# Patient Record
Sex: Female | Born: 1975 | Race: Black or African American | Hispanic: No | Marital: Married | Smoking: Never smoker
Health system: Southern US, Community
[De-identification: ages and names within clinical notes are randomized; demographics above are authoritative.]

## PROBLEM LIST (undated history)

## (undated) DIAGNOSIS — I10 Essential (primary) hypertension: Secondary | ICD-10-CM

## (undated) DIAGNOSIS — G43909 Migraine, unspecified, not intractable, without status migrainosus: Secondary | ICD-10-CM

## (undated) DIAGNOSIS — N83209 Unspecified ovarian cyst, unspecified side: Secondary | ICD-10-CM

## (undated) DIAGNOSIS — R0600 Dyspnea, unspecified: Secondary | ICD-10-CM

## (undated) DIAGNOSIS — R42 Dizziness and giddiness: Secondary | ICD-10-CM

## (undated) DIAGNOSIS — R002 Palpitations: Secondary | ICD-10-CM

## (undated) DIAGNOSIS — IMO0002 Reserved for concepts with insufficient information to code with codable children: Secondary | ICD-10-CM

## (undated) DIAGNOSIS — I341 Nonrheumatic mitral (valve) prolapse: Secondary | ICD-10-CM

## (undated) DIAGNOSIS — I499 Cardiac arrhythmia, unspecified: Secondary | ICD-10-CM

## (undated) DIAGNOSIS — N390 Urinary tract infection, site not specified: Secondary | ICD-10-CM

## (undated) DIAGNOSIS — M797 Fibromyalgia: Secondary | ICD-10-CM

## (undated) DIAGNOSIS — R569 Unspecified convulsions: Secondary | ICD-10-CM

## (undated) DIAGNOSIS — F410 Panic disorder [episodic paroxysmal anxiety] without agoraphobia: Secondary | ICD-10-CM

## (undated) DIAGNOSIS — M419 Scoliosis, unspecified: Secondary | ICD-10-CM

## (undated) DIAGNOSIS — N289 Disorder of kidney and ureter, unspecified: Secondary | ICD-10-CM

## (undated) HISTORY — DX: Reserved for concepts with insufficient information to code with codable children: IMO0002

## (undated) HISTORY — DX: Panic disorder (episodic paroxysmal anxiety): F41.0

## (undated) HISTORY — PX: OTHER SURGICAL HISTORY: SHX169

## (undated) HISTORY — DX: Scoliosis, unspecified: M41.9

## (undated) HISTORY — PX: LAPAROSCOPY: SHX197

## (undated) HISTORY — DX: Nonrheumatic mitral (valve) prolapse: I34.1

## (undated) HISTORY — PX: TUBAL LIGATION: SHX77

## (undated) HISTORY — PX: CHOLECYSTECTOMY: SHX55

## (undated) HISTORY — DX: Dizziness and giddiness: R42

## (undated) HISTORY — PX: ESOPHAGOGASTRODUODENOSCOPY ENDOSCOPY: SHX5814

## (undated) HISTORY — PX: KNEE SURGERY: SHX244

## (undated) HISTORY — PX: BACK SURGERY: SHX140

## (undated) HISTORY — DX: Migraine, unspecified, not intractable, without status migrainosus: G43.909

---

## 1998-02-08 ENCOUNTER — Inpatient Hospital Stay (HOSPITAL_COMMUNITY): Admission: AD | Admit: 1998-02-08 | Discharge: 1998-02-12 | Payer: Self-pay | Admitting: Obstetrics

## 1998-04-05 ENCOUNTER — Encounter: Admission: RE | Admit: 1998-04-05 | Discharge: 1998-04-05 | Payer: Self-pay | Admitting: Obstetrics & Gynecology

## 1998-04-05 ENCOUNTER — Other Ambulatory Visit: Admission: RE | Admit: 1998-04-05 | Discharge: 1998-04-05 | Payer: Self-pay | Admitting: Obstetrics & Gynecology

## 1998-04-25 ENCOUNTER — Inpatient Hospital Stay (HOSPITAL_COMMUNITY): Admission: AD | Admit: 1998-04-25 | Discharge: 1998-04-29 | Payer: Self-pay | Admitting: Obstetrics & Gynecology

## 1998-06-07 ENCOUNTER — Encounter: Admission: RE | Admit: 1998-06-07 | Discharge: 1998-06-07 | Payer: Self-pay | Admitting: Obstetrics & Gynecology

## 1998-06-28 ENCOUNTER — Inpatient Hospital Stay (HOSPITAL_COMMUNITY): Admission: AD | Admit: 1998-06-28 | Discharge: 1998-06-28 | Payer: Self-pay | Admitting: *Deleted

## 1998-11-28 ENCOUNTER — Inpatient Hospital Stay (HOSPITAL_COMMUNITY): Admission: AD | Admit: 1998-11-28 | Discharge: 1998-11-28 | Payer: Self-pay | Admitting: Obstetrics

## 1999-09-07 ENCOUNTER — Encounter: Admission: RE | Admit: 1999-09-07 | Discharge: 1999-09-07 | Payer: Self-pay | Admitting: Hematology and Oncology

## 2002-05-23 ENCOUNTER — Encounter: Payer: Self-pay | Admitting: Emergency Medicine

## 2002-05-23 ENCOUNTER — Inpatient Hospital Stay (HOSPITAL_COMMUNITY): Admission: EM | Admit: 2002-05-23 | Discharge: 2002-05-26 | Payer: Self-pay | Admitting: Emergency Medicine

## 2002-07-01 ENCOUNTER — Encounter: Admission: RE | Admit: 2002-07-01 | Discharge: 2002-09-29 | Payer: Self-pay | Admitting: Internal Medicine

## 2002-10-01 ENCOUNTER — Emergency Department (HOSPITAL_COMMUNITY): Admission: EM | Admit: 2002-10-01 | Discharge: 2002-10-01 | Payer: Self-pay | Admitting: Emergency Medicine

## 2003-08-18 ENCOUNTER — Encounter: Payer: Self-pay | Admitting: Family Medicine

## 2003-08-18 ENCOUNTER — Ambulatory Visit (HOSPITAL_COMMUNITY): Admission: RE | Admit: 2003-08-18 | Discharge: 2003-08-18 | Payer: Self-pay | Admitting: Family Medicine

## 2004-04-14 ENCOUNTER — Emergency Department (HOSPITAL_COMMUNITY): Admission: EM | Admit: 2004-04-14 | Discharge: 2004-04-14 | Payer: Self-pay | Admitting: Emergency Medicine

## 2004-12-29 ENCOUNTER — Ambulatory Visit (HOSPITAL_COMMUNITY): Admission: RE | Admit: 2004-12-29 | Discharge: 2004-12-29 | Payer: Self-pay | Admitting: Family Medicine

## 2005-01-23 ENCOUNTER — Encounter: Admission: RE | Admit: 2005-01-23 | Discharge: 2005-02-05 | Payer: Self-pay | Admitting: Neurosurgery

## 2005-02-24 ENCOUNTER — Emergency Department (HOSPITAL_COMMUNITY): Admission: EM | Admit: 2005-02-24 | Discharge: 2005-02-25 | Payer: Self-pay | Admitting: *Deleted

## 2005-05-07 ENCOUNTER — Emergency Department (HOSPITAL_COMMUNITY): Admission: EM | Admit: 2005-05-07 | Discharge: 2005-05-08 | Payer: Self-pay | Admitting: Emergency Medicine

## 2005-05-11 ENCOUNTER — Other Ambulatory Visit: Admission: RE | Admit: 2005-05-11 | Discharge: 2005-05-11 | Payer: Self-pay | Admitting: Obstetrics & Gynecology

## 2005-05-17 ENCOUNTER — Emergency Department (HOSPITAL_COMMUNITY): Admission: EM | Admit: 2005-05-17 | Discharge: 2005-05-18 | Payer: Self-pay | Admitting: Emergency Medicine

## 2005-05-29 ENCOUNTER — Ambulatory Visit: Payer: Self-pay | Admitting: Obstetrics and Gynecology

## 2005-06-11 ENCOUNTER — Ambulatory Visit: Payer: Self-pay | Admitting: Obstetrics and Gynecology

## 2005-06-11 ENCOUNTER — Ambulatory Visit (HOSPITAL_COMMUNITY): Admission: RE | Admit: 2005-06-11 | Discharge: 2005-06-11 | Payer: Self-pay | Admitting: Obstetrics and Gynecology

## 2005-06-19 ENCOUNTER — Ambulatory Visit: Payer: Self-pay | Admitting: Obstetrics and Gynecology

## 2005-06-22 ENCOUNTER — Ambulatory Visit (HOSPITAL_COMMUNITY): Admission: RE | Admit: 2005-06-22 | Discharge: 2005-06-22 | Payer: Self-pay | Admitting: *Deleted

## 2005-12-12 ENCOUNTER — Ambulatory Visit (HOSPITAL_COMMUNITY): Admission: RE | Admit: 2005-12-12 | Discharge: 2005-12-12 | Payer: Self-pay | Admitting: Neurosurgery

## 2006-03-20 ENCOUNTER — Emergency Department (HOSPITAL_COMMUNITY): Admission: EM | Admit: 2006-03-20 | Discharge: 2006-03-20 | Payer: Self-pay | Admitting: Emergency Medicine

## 2006-06-27 ENCOUNTER — Emergency Department (HOSPITAL_COMMUNITY): Admission: EM | Admit: 2006-06-27 | Discharge: 2006-06-27 | Payer: Self-pay | Admitting: Emergency Medicine

## 2006-10-09 ENCOUNTER — Ambulatory Visit: Payer: Self-pay | Admitting: Obstetrics & Gynecology

## 2006-12-30 ENCOUNTER — Emergency Department (HOSPITAL_COMMUNITY): Admission: EM | Admit: 2006-12-30 | Discharge: 2006-12-30 | Payer: Self-pay | Admitting: Emergency Medicine

## 2007-03-04 ENCOUNTER — Encounter (INDEPENDENT_AMBULATORY_CARE_PROVIDER_SITE_OTHER): Payer: Self-pay | Admitting: Cardiology

## 2007-03-04 ENCOUNTER — Ambulatory Visit (HOSPITAL_COMMUNITY): Admission: RE | Admit: 2007-03-04 | Discharge: 2007-03-04 | Payer: Self-pay | Admitting: Family Medicine

## 2007-06-16 ENCOUNTER — Emergency Department (HOSPITAL_COMMUNITY): Admission: EM | Admit: 2007-06-16 | Discharge: 2007-06-16 | Payer: Self-pay | Admitting: Emergency Medicine

## 2007-07-09 ENCOUNTER — Ambulatory Visit: Payer: Self-pay | Admitting: *Deleted

## 2007-07-09 ENCOUNTER — Encounter: Payer: Self-pay | Admitting: Obstetrics & Gynecology

## 2007-07-10 ENCOUNTER — Ambulatory Visit (HOSPITAL_COMMUNITY): Admission: RE | Admit: 2007-07-10 | Discharge: 2007-07-10 | Payer: Self-pay | Admitting: *Deleted

## 2007-08-19 ENCOUNTER — Emergency Department (HOSPITAL_COMMUNITY): Admission: EM | Admit: 2007-08-19 | Discharge: 2007-08-19 | Payer: Self-pay | Admitting: Emergency Medicine

## 2007-09-10 ENCOUNTER — Encounter: Payer: Self-pay | Admitting: Infectious Disease

## 2007-09-10 ENCOUNTER — Emergency Department (HOSPITAL_COMMUNITY): Admission: EM | Admit: 2007-09-10 | Discharge: 2007-09-10 | Payer: Self-pay | Admitting: Emergency Medicine

## 2007-10-07 ENCOUNTER — Ambulatory Visit: Payer: Self-pay | Admitting: Obstetrics & Gynecology

## 2008-03-18 ENCOUNTER — Encounter: Admission: RE | Admit: 2008-03-18 | Discharge: 2008-03-18 | Payer: Self-pay | Admitting: Family Medicine

## 2008-08-25 ENCOUNTER — Emergency Department (HOSPITAL_COMMUNITY): Admission: EM | Admit: 2008-08-25 | Discharge: 2008-08-25 | Payer: Self-pay | Admitting: Emergency Medicine

## 2008-08-31 ENCOUNTER — Emergency Department (HOSPITAL_COMMUNITY): Admission: EM | Admit: 2008-08-31 | Discharge: 2008-08-31 | Payer: Self-pay | Admitting: Emergency Medicine

## 2008-12-16 ENCOUNTER — Emergency Department (HOSPITAL_COMMUNITY): Admission: EM | Admit: 2008-12-16 | Discharge: 2008-12-17 | Payer: Self-pay | Admitting: Emergency Medicine

## 2008-12-22 ENCOUNTER — Encounter: Admission: RE | Admit: 2008-12-22 | Discharge: 2008-12-22 | Payer: Self-pay | Admitting: Family Medicine

## 2009-01-25 ENCOUNTER — Encounter: Admission: RE | Admit: 2009-01-25 | Discharge: 2009-02-22 | Payer: Self-pay | Admitting: Family Medicine

## 2009-01-28 ENCOUNTER — Encounter: Admission: RE | Admit: 2009-01-28 | Discharge: 2009-01-28 | Payer: Self-pay | Admitting: Gastroenterology

## 2009-01-31 ENCOUNTER — Encounter: Admission: RE | Admit: 2009-01-31 | Discharge: 2009-01-31 | Payer: Self-pay | Admitting: Gastroenterology

## 2009-07-14 ENCOUNTER — Emergency Department (HOSPITAL_COMMUNITY): Admission: EM | Admit: 2009-07-14 | Discharge: 2009-07-15 | Payer: Self-pay | Admitting: Emergency Medicine

## 2009-08-02 ENCOUNTER — Emergency Department (HOSPITAL_COMMUNITY): Admission: EM | Admit: 2009-08-02 | Discharge: 2009-08-02 | Payer: Self-pay | Admitting: Emergency Medicine

## 2009-10-13 ENCOUNTER — Emergency Department (HOSPITAL_COMMUNITY): Admission: EM | Admit: 2009-10-13 | Discharge: 2009-10-13 | Payer: Self-pay | Admitting: Emergency Medicine

## 2009-12-01 ENCOUNTER — Inpatient Hospital Stay (HOSPITAL_COMMUNITY): Admission: AD | Admit: 2009-12-01 | Discharge: 2009-12-01 | Payer: Self-pay | Admitting: Obstetrics & Gynecology

## 2010-07-04 ENCOUNTER — Encounter: Payer: Self-pay | Admitting: Cardiology

## 2010-07-06 ENCOUNTER — Ambulatory Visit: Payer: Self-pay | Admitting: Cardiology

## 2010-07-06 ENCOUNTER — Encounter: Payer: Self-pay | Admitting: Cardiology

## 2010-07-07 ENCOUNTER — Encounter: Payer: Self-pay | Admitting: Cardiology

## 2010-07-09 ENCOUNTER — Encounter: Payer: Self-pay | Admitting: Cardiology

## 2010-07-14 ENCOUNTER — Ambulatory Visit (HOSPITAL_COMMUNITY): Admission: RE | Admit: 2010-07-14 | Discharge: 2010-07-14 | Payer: Self-pay | Admitting: Gastroenterology

## 2010-07-17 ENCOUNTER — Ambulatory Visit: Payer: Self-pay | Admitting: Cardiology

## 2010-07-17 DIAGNOSIS — R002 Palpitations: Secondary | ICD-10-CM | POA: Insufficient documentation

## 2010-07-17 DIAGNOSIS — I059 Rheumatic mitral valve disease, unspecified: Secondary | ICD-10-CM

## 2010-07-17 DIAGNOSIS — R072 Precordial pain: Secondary | ICD-10-CM

## 2010-07-17 DIAGNOSIS — R9439 Abnormal result of other cardiovascular function study: Secondary | ICD-10-CM

## 2010-07-18 ENCOUNTER — Telehealth (INDEPENDENT_AMBULATORY_CARE_PROVIDER_SITE_OTHER): Payer: Self-pay | Admitting: *Deleted

## 2010-07-25 ENCOUNTER — Ambulatory Visit: Payer: Self-pay | Admitting: Cardiology

## 2010-07-26 ENCOUNTER — Ambulatory Visit (HOSPITAL_COMMUNITY)
Admission: RE | Admit: 2010-07-26 | Discharge: 2010-07-26 | Payer: Self-pay | Source: Home / Self Care | Admitting: Cardiology

## 2010-07-27 ENCOUNTER — Telehealth (INDEPENDENT_AMBULATORY_CARE_PROVIDER_SITE_OTHER): Payer: Self-pay | Admitting: *Deleted

## 2010-07-27 ENCOUNTER — Encounter: Payer: Self-pay | Admitting: Cardiology

## 2010-08-09 ENCOUNTER — Encounter (INDEPENDENT_AMBULATORY_CARE_PROVIDER_SITE_OTHER): Payer: Self-pay | Admitting: General Surgery

## 2010-08-09 ENCOUNTER — Ambulatory Visit (HOSPITAL_COMMUNITY): Admission: RE | Admit: 2010-08-09 | Discharge: 2010-08-10 | Payer: Self-pay | Admitting: General Surgery

## 2010-10-12 ENCOUNTER — Telehealth (INDEPENDENT_AMBULATORY_CARE_PROVIDER_SITE_OTHER): Payer: Self-pay | Admitting: *Deleted

## 2010-11-25 ENCOUNTER — Encounter: Payer: Self-pay | Admitting: Family Medicine

## 2010-11-26 ENCOUNTER — Encounter: Payer: Self-pay | Admitting: Family Medicine

## 2010-11-26 ENCOUNTER — Encounter: Payer: Self-pay | Admitting: Cardiology

## 2010-12-07 NOTE — Assessment & Plan Note (Signed)
Summary: POST [facility] PER CALL FROM 3RD FLOOR-JM   Visit Type:  hospital follow-up Primary Provider:  [facility]  CC:  Abnormal Stress Perfusion.  History of Present Illness: The patient presents for evaluation of chest discomfort and abnormal stress perfusion study. She was admitted on August 30 with an episode of chest heaviness and shortness of breath. Accompanying this she had tingling in her feet, weakness, dizziness, presyncope.  She was hospitalized I have thoroughly reviewed these notes. She ruled out for myocardial infarction with negative cardiac enzymes. CT angiography of the chest demonstrated no evidence of pulmonary embolism. She had an echocardiogram which demonstrated a slightly myxomatous mitral valve with trace mitral regurgitation but no mention of prolapse. She had normal left ventricular function and no other significant valvular abnormalities. She had a stress perfusion study which demonstrated a small reversible mid inferolateral and apical lateral defect as well as a small mid inferoapical defect and an EF of 55%. It was mentioned that there was breast attenuation, mild diaphragmatic attenuation and gut uptake.  At that point it was decided to manage the patient medically and she was discharged. She was started on aspirin. Since discharge she said she has had dizziness and weakness but is slowly improving. She has not been back to doing her household chores or other activities. She did take one nitroglycerin since discharge apparently a few days ago. She has not required any since then. She has palpitations which have been long-standing however, she does not describe presyncope or syncope. She has had no PND orthopnea.  Preventive Screening-Counseling & Management  Alcohol-Tobacco     Smoking Status: never  Current Medications (verified): 1)  Fluoxetine Hcl 20 Mg Caps (Fluoxetine Hcl) .... Take 3 Tablet By Mouth Once A Day 2)  Aspir-Low 81 Mg Tbec (Aspirin)  .... Take 1 Tablet By Mouth Once A Day 3)  Clonazepam 1 Mg Tabs (Clonazepam) .... Take 1 Tablet By Mouth Four Times A Day 4)  Nitrostat 0.4 Mg Subl (Nitroglycerin) .... Use As Directed Chest Pain 5)  Metoprolol Tartrate 25 Mg Tabs (Metoprolol Tartrate) .... Take 1 Tablet By Mouth Two Times A Day  Allergies (verified): 1)  ! Sulfa 2)  ! Norvasc  Comments:  Nurse/Medical Assistant: The patient's medication list and allergies were reviewed with the patient and were updated in the Medication and Allergy Lists.  Past History:  Past Medical History: Panic attacks Scoliosis Mitral valve prolapse Asthma as a child  Past Surgical History: Tubal ligation Laparoscopy C-Section  Social History: Has 3 children in their teens Married Nonsmoker (Never) Denies any active [place] of alcohol Denies any drugs Smoking Status:  never  Review of Systems       Abdominal discomfort, depression/anxiety, joint pains. Otherwise as stated in the history of present illness negative for all other systems.  Vital Signs:  Patient profile:   35 year old female Height:      63 inches Weight:      169 pounds BMI:     30.05 O2 Sat:      98 % on Room air Pulse rate:   85 / minute BP sitting:   109 / 72  (left arm) Cuff size:   regular  Vitals Entered By: Carlye Grippe (July 17, 2010 4:02 PM)  Nutrition Counseling: Patient's BMI is greater than 25 and therefore counseled on weight management options.  O2 Flow:  Room air  Physical Exam  General:  Well developed, well nourished, in no acute distress.  Head:  normocephalic and atraumatic Eyes:  PERRLA/EOM intact; conjunctiva and lids normal. Mouth:  Teeth, gums and palate normal. Oral mucosa normal. Neck:  Neck supple, no JVD. No masses, thyromegaly or abnormal cervical nodes. Chest Wall:  no deformities or breast masses noted Lungs:  Clear bilaterally to auscultation and percussion. Abdomen:  Bowel sounds positive; abdomen soft without  masses, organomegaly, or hernias noted. No hepatosplenomegaly. , mild midline tenderness to palpation, without rebound or guarding Msk:  Back normal, normal gait. Muscle strength and tone normal. Extremities:  No clubbing or cyanosis. Neurologic:  Alert and oriented x 3. Skin:  Intact without lesions or rashes. Cervical Nodes:  no significant adenopathy Inguinal Nodes:  no significant adenopathy Psych:  Normal affect.   Detailed Cardiovascular Exam  Neck    Carotids: Carotids full and equal bilaterally without bruits.      Neck Veins: Normal, no JVD.    Heart    Inspection: no deformities or lifts noted.      Palpation: normal PMI with no thrills palpable.      Auscultation: regular rate and rhythm, S1, S2 without murmurs, rubs, gallops, or clicks.    Vascular    Abdominal Aorta: no palpable masses, pulsations, or audible bruits.      Femoral Pulses: normal femoral pulses bilaterally.      Pedal Pulses: normal pedal pulses bilaterally.      Radial Pulses: normal radial pulses bilaterally.      Peripheral Circulation: no clubbing, cyanosis, or edema noted with normal capillary refill.     Impression & Recommendations:  Problem # 1:  PRECORDIAL PAIN (ICD-786.51) The patient has symptoms as described. Unfortunately her stress test was equivocal with a description of artifact but ischemia could not be excluded. I think the posttest probability of a false positive study is very high in this situation. Her pretest probability of obstructive coronary disease is low. Therefore, I do not think the risk of cardiac catheterization is indicated. However, we cannot exclude obstructive coronary disease without further testing. We discussed at length the possibility of cardiac catheterization versus dobutamine echocardiography versus coronary CT angiography. For many reasons I think the latter is the optimal test. I will beta blocker to try to get her heart rate into the 60s. We will then schedule  this study.  Problem # 2:  MITRAL VALVE DISORDERS (ICD-424.0) She has a slightly myxomatous mitral valve which can be followed clinically. No further testing is indicated at this point.  Problem # 3:  PALPITATIONS (ICD-785.1) She has had palpitations. She was treated in the past with Inderal and if these persist this could be used on a p.r.n. basis.  Other Orders: CT Scan (CT Scan)  Patient Instructions: 1)  Metoprolol 25mg  two times a day - to be taken the day before and the morning of your CT  2)  CT Angiogram  3)  Follow up based on above results Prescriptions: METOPROLOL TARTRATE 25 MG TABS (METOPROLOL TARTRATE) Take 1 tablet by mouth two times a day  #15 x 0   Entered by:   Hoover Brunette, LPN   Authorized by:   Rollene Rotunda, MD, [facility]   Signed by:   Hoover Brunette, LPN on 16/08/9603   Method used:   Electronically to        [facility]  [address] [address] [id number] (retail)       [address]       [place]  [place], [place]  [address]       Ph: [phone] or [phone]       Fax: [phone]   RxID:   [id number]  I have reviewed and approved all prescriptions at the time of this visit. Rollene Rotunda, MD, [facility]  July 17, 2010 5:11 PM

## 2010-12-07 NOTE — Progress Notes (Signed)
Summary: PHONE: [place] [place]  Phone Note Call from Patient Call back at Home Phone [phone]   Caller: Patient Summary of Call: Mrs. Vedia Coffer needs to ask a question about her beta blockers.  Initial call taken by: Zachary George,  July 18, 2010 4:53 PM  Follow-up for Phone Call        Advised pt to be taken the day before and the morning of your CT.  Patient verbalized understanding.   Follow-up by: Hoover Brunette, LPN,  July 24, 2010 2:35 PM

## 2010-12-07 NOTE — Consult Note (Signed)
Summary: CARDIOLOGY CONSULT/ [facility]  CARDIOLOGY CONSULT/ [facility]   Imported By: Zachary George 07/17/2010 14:24:50  _____________________________________________________________________  External Attachment:    Type:   Image     Comment:   External Document

## 2010-12-07 NOTE — Progress Notes (Signed)
  Request Received from [facility] sent to [facility]  October 12, 2010 2:55 PM

## 2010-12-07 NOTE — Letter (Signed)
Summary: Letter/ FAXED [facility] MEDICAL CLEARANCE  Letter/ FAXED [facility] MEDICAL CLEARANCE   Imported By: Dorise Hiss 08/01/2010 11:02:58  _____________________________________________________________________  External Attachment:    Type:   Image     Comment:   External Document

## 2010-12-07 NOTE — Progress Notes (Signed)
Summary: Surgical Clearance  Phone Note Other Incoming Call back at Phone [phone], Fax [phone]   Summary of Call: Received cardiac clearance request from [facility] for lap chole to be done with general anesthesia by Dr. Chevis Pretty "in the near future." Please advise if pt is cleared for surgery. Initial call taken by: Cyril Loosen, RN, BSN,  July 27, 2010 3:46 PM  Follow-up for Phone Call        [name] is at acceptable risk for surgery.  No further cardiovascular testing is indicated. Follow-up by: Rollene Rotunda, MD, [facility],  July 30, 2010 10:08 PM

## 2010-12-07 NOTE — Letter (Signed)
Summary: Discharge Summary  Discharge Summary   Imported By: Zachary George 07/17/2010 14:24:21  _____________________________________________________________________  External Attachment:    Type:   Image     Comment:   External Document

## 2011-01-18 LAB — SURGICAL PCR SCREEN

## 2011-01-18 LAB — COMPREHENSIVE METABOLIC PANEL
ALT: 16 U/L (ref 0–35)
AST: 17 U/L (ref 0–37)
CO2: 28 mEq/L (ref 19–32)
Chloride: 104 mEq/L (ref 96–112)
Creatinine, Ser: 0.66 mg/dL (ref 0.4–1.2)
GFR calc Af Amer: >60 mL/min (ref >60)
GFR calc non Af Amer: >60 mL/min (ref >60)
Total Bilirubin: 0.5 mg/dL (ref 0.3–1.2)

## 2011-01-18 LAB — DIFFERENTIAL
Basophils Absolute: 0 10*3/uL (ref 0.0–0.1)
Basophils Relative: 1 % (ref 0–1)
Eosinophils Absolute: 0.1 10*3/uL (ref 0.0–0.7)
Eosinophils Relative: 3 % (ref 0–5)

## 2011-01-18 LAB — CBC
Hemoglobin: 11.6 g/dL — ABNORMAL LOW (ref 12.0–15.0)
MCH: 29.5 pg (ref 26.0–34.0)
RBC: 3.95 MIL/uL (ref 3.87–5.11)

## 2011-01-18 LAB — MRSA CULTURE

## 2011-01-21 LAB — WET PREP, GENITAL

## 2011-01-21 LAB — CBC
Hemoglobin: 11.2 g/dL — ABNORMAL LOW (ref 12.0–15.0)
MCHC: 33.1 g/dL (ref 30.0–36.0)
MCV: 89.9 fL (ref 78.0–100.0)
RBC: 3.78 MIL/uL — ABNORMAL LOW (ref 3.87–5.11)

## 2011-01-21 LAB — POCT PREGNANCY, URINE: Preg Test, Ur: NEGATIVE

## 2011-01-21 LAB — URINALYSIS, ROUTINE W REFLEX MICROSCOPIC
Bilirubin Urine: NEGATIVE
Ketones, ur: NEGATIVE mg/dL
Nitrite: POSITIVE — AB
Urobilinogen, UA: 1 mg/dL (ref 0.0–1.0)

## 2011-01-21 LAB — GC/CHLAMYDIA PROBE AMP, GENITAL: Chlamydia, DNA Probe: NEGATIVE

## 2011-02-06 LAB — BASIC METABOLIC PANEL
BUN: 10 mg/dL (ref 6–23)
Calcium: 9 mg/dL (ref 8.4–10.5)
GFR calc non Af Amer: >60 mL/min (ref >60)
Potassium: 3.6 mEq/L (ref 3.5–5.1)
Sodium: 135 mEq/L (ref 135–145)

## 2011-02-06 LAB — URINALYSIS, ROUTINE W REFLEX MICROSCOPIC
Glucose, UA: NEGATIVE mg/dL
Nitrite: NEGATIVE
Specific Gravity, Urine: 1.009 (ref 1.005–1.030)
pH: 7.5 (ref 5.0–8.0)

## 2011-02-06 LAB — CBC
HCT: 37.7 % (ref 36.0–46.0)
Platelets: 331 10*3/uL (ref 150–400)
WBC: 5.8 10*3/uL (ref 4.0–10.5)

## 2011-02-06 LAB — URINE MICROSCOPIC-ADD ON

## 2011-02-06 LAB — DIFFERENTIAL
Basophils Absolute: 0 10*3/uL (ref 0.0–0.1)
Eosinophils Relative: 2 % (ref 0–5)
Lymphocytes Relative: 23 % (ref 12–46)
Lymphs Abs: 1.3 10*3/uL (ref 0.7–4.0)
Neutro Abs: 3.8 10*3/uL (ref 1.7–7.7)

## 2011-02-09 LAB — DIFFERENTIAL
Basophils Absolute: 0 10*3/uL (ref 0.0–0.1)
Basophils Relative: 0 % (ref 0–1)
Eosinophils Relative: 2 % (ref 0–5)
Monocytes Absolute: 0.5 10*3/uL (ref 0.1–1.0)
Monocytes Relative: 8 % (ref 3–12)
Neutro Abs: 3.8 10*3/uL (ref 1.7–7.7)

## 2011-02-09 LAB — URINALYSIS, ROUTINE W REFLEX MICROSCOPIC
Bilirubin Urine: NEGATIVE
Ketones, ur: NEGATIVE mg/dL
Nitrite: NEGATIVE
Protein, ur: NEGATIVE mg/dL

## 2011-02-09 LAB — CBC
HCT: 39.9 % (ref 36.0–46.0)
Hemoglobin: 13.6 g/dL (ref 12.0–15.0)
MCHC: 34 g/dL (ref 30.0–36.0)
MCV: 86.7 fL (ref 78.0–100.0)
RBC: 4.61 MIL/uL (ref 3.87–5.11)
RDW: 14.4 % (ref 11.5–15.5)

## 2011-02-09 LAB — URINE CULTURE: Colony Count: >=100000

## 2011-02-09 LAB — URINE MICROSCOPIC-ADD ON

## 2011-02-09 LAB — POCT I-STAT, CHEM 8
Calcium, Ion: 1.05 mmol/L — ABNORMAL LOW (ref 1.12–1.32)
Creatinine, Ser: 0.7 mg/dL (ref 0.4–1.2)
Glucose, Bld: 100 mg/dL — ABNORMAL HIGH (ref 70–99)
HCT: 43 % (ref 36.0–46.0)
Hemoglobin: 14.6 g/dL (ref 12.0–15.0)
Potassium: 3.9 mEq/L (ref 3.5–5.1)

## 2011-02-09 LAB — POCT CARDIAC MARKERS
CKMB, poc: <1 ng/mL — ABNORMAL LOW (ref 1.0–8.0)
Troponin i, poc: <0.05 ng/mL (ref 0.00–0.09)

## 2011-02-20 LAB — CBC
HCT: 39.8 % (ref 36.0–46.0)
Hemoglobin: 13.2 g/dL (ref 12.0–15.0)
RBC: 4.52 MIL/uL (ref 3.87–5.11)
WBC: 5.4 10*3/uL (ref 4.0–10.5)

## 2011-02-20 LAB — URINALYSIS, ROUTINE W REFLEX MICROSCOPIC
Glucose, UA: NEGATIVE mg/dL
Hgb urine dipstick: NEGATIVE
pH: 6.5 (ref 5.0–8.0)

## 2011-02-20 LAB — POCT I-STAT, CHEM 8
BUN: 9 mg/dL (ref 6–23)
Calcium, Ion: 1.15 mmol/L (ref 1.12–1.32)
Chloride: 106 mEq/L (ref 96–112)
Creatinine, Ser: 0.7 mg/dL (ref 0.4–1.2)
Glucose, Bld: 105 mg/dL — ABNORMAL HIGH (ref 70–99)

## 2011-02-20 LAB — DIFFERENTIAL
Basophils Absolute: 0 10*3/uL (ref 0.0–0.1)
Eosinophils Relative: 4 % (ref 0–5)
Lymphocytes Relative: 29 % (ref 12–46)
Lymphs Abs: 1.6 10*3/uL (ref 0.7–4.0)
Monocytes Absolute: 0.6 10*3/uL (ref 0.1–1.0)
Monocytes Relative: 10 % (ref 3–12)
Neutro Abs: 3 10*3/uL (ref 1.7–7.7)

## 2011-02-20 LAB — POCT PREGNANCY, URINE: Preg Test, Ur: NEGATIVE

## 2011-03-20 NOTE — Group Therapy Note (Signed)
NAME:  [name], [name]               ACCOUNT NO.:  [id number]   MEDICAL RECORD NO.:  [id number]          PATIENT TYPE:  WOC   LOCATION:  [facility]                   FACILITY:  [facility]   PHYSICIAN:  Wilburt Finlay, M.D.     DATE OF BIRTH:  1976-08-31   DATE OF SERVICE:                                  CLINIC NOTE   CHIEF COMPLAINT:  Patient presents for yearly GYN exam.  Patient also  complains of vaginal discharge.   HISTORY OF PRESENT ILLNESS:  Patient complains of vaginal discharge x2  weeks, thin, white, odorless, no vaginal itching.  Is sexually active  with partner of a few months.  Has been divorced for the last 2 years,  now with new partner.  Has had yearly Pap smears in the past.  Has had  history of an abnormal Pap smear greater than 10 years ago, since then  all yearly Pap smears have been normal.  She uses condoms for  contraception and reportedly uses it all the time.  She does have a  history of bilateral tubal ligation.  Patient at this visit also  complains of decreased libido.  After further questioning she does admit  to having a lot of stresses at her job, there is no recent medication  changes and she denies any depression.  She is currently not happy in  her relationship and thinks that may be part of the problem.  She also  describes symptoms of always being on the go, decreased sleep, appetite  has been okay.   PHYSICAL EXAMINATION:  VITAL SIGNS:  Temperature 97.5, pulse 64, blood  pressure 108/78, weight 184 pounds.  CHEST:  Regular rate and rhythm, no murmurs appreciated.  LUNGS:  Clear to auscultation bilaterally.  ABDOMEN:  Soft, nontender, nondistended, no palpable masses appreciated.  PELVIC:  External genitalia appeared normal, vaginal mucosa normal.  Cervix appeared normal with no cysts, no cervical motion tenderness.  Patient does have right adnexal tenderness that seems to be  reproducible.  Uterus seems small and mobile.  EXTREMITIES:  No edema.   ASSESSMENT:  1. Routine GYN visit.  Pap smear done today; gonorrhea, chlamydia also      done.  Will also check a wet prep for notable vaginal discharge.      Will also treat presumptively for bacterial vaginosis.  2. Right adnexal tenderness, unclear etiology.  Patient describes a      history of ovarian cysts in the past, will check a pelvic      ultrasound to further evaluate pelvic pain.  3. Patient also with vague complaint of fatigue, hyperactivity,      insomnia.  Will check a CBC, a comprehensive metabolic profile and      a TSH.  Patient will call few days after ultrasound to obtain      results of blood work and pelvic ultrasound to determine further      followup.           ______________________________  Wilburt Finlay, M.D.    LJ/MEDQ  D:  07/09/2007  T:  07/09/2007  Job:  [id number]
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
## 2011-03-23 NOTE — Discharge Summary (Signed)
[facility]  Patient:    [name], [name] Visit Number: [id number] MRN: [id number]          Service Type: MED Location: 3A A326 01 Attending Physician:  Isidor Holts Dictated by:   Margaretann Loveless, M.D. Admit Date:  05/23/2002 Discharge Date: 05/26/2002   CC:         Doreen Beam, M.D., [facility], [place], [place]   Discharge Summary  DISCHARGE DIAGNOSES: 1. 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 Doreen Beam as an outpatient. 2. Probable anxiety disorder, much improved with Klonopin, as noted below, for    followup with mental health as an outpatient. 3. Possible depression, for followup with mental health as an outpatient. 4. Hypomagnesemia and hypokalemia, both replaced during this hospitalization.  DISCHARGE MEDICATIONS: 1. Soma tabs -- one p.o. t.i.d. for four days, then one-half a tab t.i.d. for    one week, then one-half a tab q.6h. p.r.n. for muscle spasms. 2. Klonopin 1 mg one p.o. t.i.d. for four days, then b.i.d. for one week, then    q.d. 3. Over-the-counter Tylenol 1000 mg every eight hours p.r.n. for pain.  FOLLOWUP:  She will follow up with Dr. Sherril Croon as directed; she is to call his office today or tomorrow and they will give her a time for an appointment. She will follow up with mental health as directed.  DIET:  Her diet will be regular.  ACTIVITY:  She is to increase activity slowly.  She is to avoid falls.  SPECIAL DISCHARGE INSTRUCTIONS:  The family will need to hold all medications for sedation or dizziness or falls.  HISTORY AND PHYSICAL:  Please refer to the dictated history and physical.  LABORATORY AND ACCESSORY DATA:  The patients phosphorus level was normal at 4.0.  CBC showed a white cell count of 5.5, hemoglobin 12.7, platelets of 371,000.  MCV was 85.3.  A chemistry panel showed a sodium slightly  decreased at 134, potassium slightly decreased at 3.2, BUN of 15, creatinine 0.9.  Her cardiac enzymes were normal.  Her urine toxicology screen was negative for any controlled substances.  Her repeat potassium was normal at 3.7 after replacement.  Her head CT showed no acute abnormalities.  Her EKG showed normal sinus rhythm.  She did have T wave inversions in V1 and V2 but no other significant changes.  HOSPITAL COURSE: #1 - ABDOMINAL CRAMPING AND SPASMS:  On admission, it was unclear the etiology of her symptoms.  She had been under a lot of stress at home recently and had subsequently developed these jerking movements.  Head CT on admission did not show any significant abnormalities and her laboratory tests were unrevealing for any significant abnormalities except slight electrolyte abnormalities as noted above.  We felt that she probably was having a conversion reaction from increased stress as an outpatient.  She was treated with Klonopin and did not improve much.  I added Soma tablets and this did seem to improve her significantly.  She was still having a few muscle spasms even at discharge but these were much improved from admission.  She will continue with her Soma tablets and Klonopin on a tapering dose, as noted above, and follow up with Dr. Sherril Croon in the office.  #2 - ANXIETY AND PROBABLE DEPRESSION:  I feel that the above muscle spasms may have been a conversion disorder from her severe  anxiety and probable depression.  Mental health did see her and have asked her to follow up with them as an outpatient.  #3 - ABDOMINAL PAIN:  This was treated with Tylenol during this hospitalization.  #4 - HYPOKALEMIA:  This was replaced during this hospitalization p.o. and was normal at last check.  #5 - HYPOMAGNESEMIA:  Her magnesium level was low at 1.4 and this was replaced IV.  Dictated by:   Margaretann Loveless, M.D. Attending Physician:  Isidor Holts DD:  05/26/02 TD:   06/01/02 Job: [id number]

## 2011-03-23 NOTE — Group Therapy Note (Signed)
NAME:  [name], [name]                  ACCOUNT NO.:  [id number]   MEDICAL RECORD NO.:  [id number]          PATIENT TYPE:  WOC   LOCATION:  [facility]                   FACILITY:  [facility]   PHYSICIAN:  Argentina Donovan, MD        DATE OF BIRTH:  12-Sep-1976   DATE OF SERVICE:  05/29/2005                                    CLINIC NOTE   The patient is a 35 year old gravida 3, para 3-0-0-3 with her first baby by  cesarean section followed by two vaginal deliveries with a history of severe  preeclampsia.  She was sent in by Dr. Henderson Cloud because of chronic pelvic pain  that significantly started with coitus and an emergency room visit to follow  in mid cycle.  The pain waned during the following days, but since then she  has chronic right lower quadrant pain.  An ultrasound showed a small complex  cyst on the right side 2 cm in diameter but the pain has continued.  She has  also had a slight increase in the volume of her periods with sometimes  disabling periods.  The patient's history is significant only for mitral  valve prolapse which will need prophylaxis during surgery.  We told the  patient we are going to schedule her for a laparoscopy with a possible  laparotomy.  We have discussed the procedure and the possible complications  involved and that we would not do anything major without having woke her up  and discussing her and rescheduling later.   ALLERGIES:  SULFA.   PAST MEDICAL HISTORY:  Cesarean section, two vaginal deliveries, mitral  valve prolapse, bilateral tubal ligation.   REVIEW OF SYSTEMS:  Negative with exception to present illness.   IMPRESSION:  Chronic pelvic pain.   PLAN:  Laparoscopy and possible laparotomy.       PR/MEDQ  D:  05/29/2005  T:  05/30/2005  Job:  [id number]

## 2011-03-23 NOTE — Op Note (Signed)
NAME:  [name], [name]                  ACCOUNT NO.:  [id number]   MEDICAL RECORD NO.:  [id number]          PATIENT TYPE:  AMB   LOCATION:  [facility]                           [facility]   PHYSICIAN:  Phil D. Okey Dupre, M.D.     DATE OF BIRTH:  09/29/1976   DATE OF PROCEDURE:  06/11/2005  DATE OF DISCHARGE:                                 OPERATIVE REPORT   PROCEDURE:  Diagnostic laparoscopy.   PREOPERATIVE DIAGNOSIS:  Chronic pelvic pain.   POSTOPERATIVE DIAGNOSES:  1.  Chronic pelvic pain.  2.  Normal female pelvis.   SURGEON:  Javier Glazier. Okey Dupre, M.D.   ANESTHESIA:  General.   ESTIMATED BLOOD LOSS:  Less than 5 mL.   POSTOPERATIVE CONDITION:  Satisfactory.   OPERATIVE FINDINGS:  In the pelvis, the uterus was normal size, shape,  consistency, anterior flexed, with completely normal adnexa, no sign of  abnormal ovarian cysts of endometriosis or pelvic adhesions.   PROCEDURE:  Under satisfactory general anesthesia, the patient in the dorsal  semilithotomy position, the perineum, vagina and abdomen were prepped and  draped in the usual sterile manner.  Bimanual pelvic examination revealed  the uterus to be normal size, shape and consistency with normal adnexa.  A  weighted speculum was placed in the posterior fourchette of the vagina,  anterior lip of the cervix grasped with a single-tooth tenaculum, and an  acorn cannula placed in the cervix, attached to the tenaculum for  mobilization of the uterus.  The speculum was removed from the vagina, the  Veress needle inserted in the peritoneal cavity at the lower pole of the  umbilicus after a 1 cm transverse incision was made just below the  umbilicus.  Approximately 3 L of carbon dioxide slowly insufflated into the  peritoneal cavity after using the usual precautions to make sure we were in  the peritoneal cavity.  The Veress needle was removed, the laparoscope  trocar inserted into the peritoneal cavity, the trocar removed in the  sleeve, the  laparoscope inserted into the sleeve and the pelvic organs  easily visualized and found to be completely normal with no sign of  endometriosis, adhesions or abnormal ovarian cysts.  There were some fine  adhesions in the area of the cecum to the pelvic wall, but the cecum was  very mobile and I doubt that this could have accounted for the patient's  pain.  There was no sign of any adhesions up around the liver and no  pathology was noted any place within the peritoneal cavity.  The scope was  removed, as much CO2 as possible expressed through the sleeve, the  sleeve removed, and the incision closed with a 3-0 Vicryl suture, which was  used to close the fascia, and then run up to the subcuticular closure.  A  dry sterile dressing was applied, the tenaculums were removed from the  vagina, patient transferred to the recovery room in satisfactory condition,  having tolerated the procedure well.       PDR/MEDQ  D:  06/11/2005  T:  06/11/2005  Job:  [id number] 

## 2011-05-08 ENCOUNTER — Encounter: Payer: Self-pay | Admitting: Cardiology

## 2011-06-11 ENCOUNTER — Inpatient Hospital Stay (HOSPITAL_COMMUNITY)
Admission: EM | Admit: 2011-06-11 | Discharge: 2011-06-14 | DRG: 313 | Disposition: A | Discharge status: Home / Self Care | Payer: Self-pay | Attending: Family Medicine | Admitting: Family Medicine

## 2011-06-11 DIAGNOSIS — I059 Rheumatic mitral valve disease, unspecified: Secondary | ICD-10-CM | POA: Diagnosis present

## 2011-06-11 DIAGNOSIS — R0789 Other chest pain: Principal | ICD-10-CM | POA: Diagnosis present

## 2011-06-11 DIAGNOSIS — B353 Tinea pedis: Secondary | ICD-10-CM | POA: Diagnosis present

## 2011-06-11 DIAGNOSIS — F410 Panic disorder [episodic paroxysmal anxiety] without agoraphobia: Secondary | ICD-10-CM | POA: Diagnosis present

## 2011-06-11 DIAGNOSIS — M4120 Other idiopathic scoliosis, site unspecified: Secondary | ICD-10-CM | POA: Diagnosis present

## 2011-06-11 DIAGNOSIS — L259 Unspecified contact dermatitis, unspecified cause: Secondary | ICD-10-CM | POA: Diagnosis present

## 2011-06-11 DIAGNOSIS — F329 Major depressive disorder, single episode, unspecified: Secondary | ICD-10-CM | POA: Diagnosis present

## 2011-06-11 DIAGNOSIS — F3289 Other specified depressive episodes: Secondary | ICD-10-CM | POA: Diagnosis present

## 2011-06-11 LAB — DIFFERENTIAL
Eosinophils Absolute: 0.3 10*3/uL (ref 0.0–0.7)
Lymphs Abs: 1.8 10*3/uL (ref 0.7–4.0)
Monocytes Absolute: 0.6 10*3/uL (ref 0.1–1.0)
Monocytes Relative: 11 % (ref 3–12)
Neutrophils Relative %: 50 % (ref 43–77)

## 2011-06-11 LAB — CBC
MCH: 27.9 pg (ref 26.0–34.0)
MCHC: 32.3 g/dL (ref 30.0–36.0)
MCV: 86.3 fL (ref 78.0–100.0)
Platelets: 335 10*3/uL (ref 150–400)
RBC: 4.02 MIL/uL (ref 3.87–5.11)

## 2011-06-12 ENCOUNTER — Emergency Department (HOSPITAL_COMMUNITY): Payer: Self-pay

## 2011-06-12 LAB — LIPID PANEL
LDL Cholesterol: 81 mg/dL (ref 0–99)
VLDL: 11 mg/dL (ref 0–40)

## 2011-06-12 LAB — CARDIAC PANEL(CRET KIN+CKTOT+MB+TROPI)
CK, MB: 1.2 ng/mL (ref 0.3–4.0)
Relative Index: 1.1 (ref 0.0–2.5)
Relative Index: 1 (ref 0.0–2.5)
Total CK: 141 U/L (ref 7–177)
Total CK: 120 U/L (ref 7–177)
Troponin I: <0.3 ng/mL (ref <0.30)
Troponin I: <0.3 ng/mL (ref <0.30)

## 2011-06-12 LAB — BASIC METABOLIC PANEL
BUN: 12 mg/dL (ref 6–23)
Chloride: 101 mEq/L (ref 96–112)
Chloride: 103 mEq/L (ref 96–112)
GFR calc Af Amer: >60 mL/min (ref >60)
GFR calc non Af Amer: >60 mL/min (ref >60)
Glucose, Bld: 99 mg/dL (ref 70–99)
Potassium: 3.5 mEq/L (ref 3.5–5.1)
Potassium: 3.6 mEq/L (ref 3.5–5.1)

## 2011-06-12 LAB — D-DIMER, QUANTITATIVE: D-Dimer, Quant: 0.65 ug/mL-FEU — ABNORMAL HIGH (ref 0.00–0.48)

## 2011-06-12 NOTE — H&P (Signed)
[name], [name]                  ACCOUNT NO.:  [id number]  MEDICAL RECORD NO.:  [id number]  LOCATION:  [facility]                         FACILITY:  [facility]  PHYSICIAN:  Gery Pray, MD      DATE OF BIRTH:  05-21-1976  DATE OF ADMISSION:  06/11/2011 DATE OF DISCHARGE:                             HISTORY & PHYSICAL   PRIMARY CARE PHYSICIAN:  Lavonda Jumbo, M.D., with [facility]  CODE STATUS:  Full code.  CHIEF COMPLAINT:  Chest pain.  HISTORY OF PRESENT ILLNESS:  This is a 35 year old female who said that she developed chest pain yesterday at approximately 5 p.m.  They were left-sided.  They went to her shoulders and down her left arm.  She states she was quite diaphoretic.  She had no nausea, no vomiting.  She states she did have some slight chills.  There were not aggravated with activity or rest.  They were not aggravated by movement.  She states she had some palpitations and some shortness of breath with the pain.  She ranks the pain as 10/10, sharp, and constant.  She states she has no history of GERD and it does not feel like heartburn.  She states that she did have chest pains and had a stress test approximately 5 years ago, it was normal.  She has never had a heart cath. She does not have any family history of coronary artery disease.  She does have a history of mitral valve prolapse.  She was followed by a cardiologist for years as a child.  Here in the ER, the patient states that she received nitro and she also received aspirin.  She states that her pain still remains at 10/10. History obtained from the patient who is alert and oriented.  PAST MEDICAL HISTORY: 1. Mitral valve prolapse. 2. Scoliosis.  PAST SURGICAL HISTORY:  C-section, cholecystectomy, and laparoscopic surgery.  MEDICATIONS:  None.  ALLERGIES:  SULFA, which caused a rash.  SOCIAL HISTORY:  Negative for tobacco, alcohol, or illicit drugs.  She lives with her family.  FAMILY HISTORY:   Significant for coronary artery disease with a history of CHF, diabetes mellitus, hypertension, and cancer.  REVIEW OF SYSTEMS:  All 10-point systems reviewed are negative except as noted in the HPI.  PHYSICAL EXAMINATION:  VITAL SIGNS:  Blood pressure 143/89, pulse 65, respirations 18, temperature 98.3.  GENERAL:  Alert, oriented female. EYES:  Pink conjunctivae.  PERRLA. ENT:  Moist oral mucosa.  Trachea midline. NECK:  Supple.  No carotid bruits.  No JVD. LUNGS:  Clear to auscultation bilaterally.  No wheeze.  No use of accessory muscles. CARDIOVASCULAR:  Regular rate and rhythm without murmurs, rubs, or gallops.  No reproducible chest wall pain. ABDOMEN:  Soft, positive bowel sounds, nontender, and nondistended.  No organomegaly. NEURO:  Cranial nerves II through XII grossly intact.  Sensation, the patient states is often in sole of her right foot. MUSCULOSKELETAL:  Strength 5/5 in all extremities.  No clubbing, cyanosis, or edema. SKIN: no rash, no subcutaneous crepitations  LABORATORY DATA:  EKG, normal sinus rhythm with no ST-segment changes. Troponin 0.00.  Chest x-ray, no evidence of acute cardiopulmonary  disease.  Sodium 136, potassium 3.5, chloride 101, CO2 of 20, glucose 99, BUN 12, creatinine 0.53.  Repeat troponin 0.00.  White blood count 5.4, hemoglobin 12.2, platelets of 135.  ASSESSMENT AND PLAN:  Left-sided chest pain.  The patient will be admitted to the ICU.  She will be started on a nitro drip.  Aspirin, beta-blockers will be started.  The patient's pain complaint appears out of proportion to the patient's physical examination.  I will not escalate treatment.  The patient will be on Lovenox deep venous thrombosis prophylaxis.  I will order a D-dimer to rule out other causes of the patient's chest pain.  We will defer to the a.m. team for further workup.  The patient's very concerned pastor is at the bedside. We will start the patient on Protonix.  Lipid  panel will be ordered for the morning.  No statin has been started.          ______________________________ Gery Pray, MD     DC/MEDQ  D:  06/12/2011  T:  06/12/2011  Job:  [id number]  Electronically Signed by Gery Pray MD on 06/12/2011 08:35:34 PM

## 2011-06-14 DIAGNOSIS — F329 Major depressive disorder, single episode, unspecified: Secondary | ICD-10-CM

## 2011-06-15 NOTE — Discharge Summary (Unsigned)
[name], MCLESTER                  ACCOUNT NO.:  [id number]  MEDICAL RECORD NO.:  [id number]  LOCATION:  1412                         FACILITY:  [facility]  PHYSICIAN:  Calvert Cantor, M.D.     DATE OF BIRTH:  1976/08/01  DATE OF ADMISSION:  06/11/2011 DATE OF DISCHARGE:  06/14/2011                              DISCHARGE SUMMARY   PRIMARY CARE PHYSICIAN:  Currently none.  She was previously with Dr. __________ , now the patient has no insurance.  We have made her an appointment with [facility] and also with Mental Health.  PRESENTING COMPLAINT:  Chest pain.  DISCHARGE DIAGNOSES: 1. Chest pain secondary to anxiety and panic attack. 2. Severe anxiety and depression. 3. Morbid obesity.  DISCHARGE MEDICATIONS: 1. Prozac 20 mg daily. 2. Klonopin 0.5 to 1 mg every 8 hours as needed for panic attack.  PROCEDURES: 1. Chest x-ray, 2-view, June 12, 2011, revealed mild cardiomegaly.     No evidence of cardiopulmonary disease. 2. A 2D echo performed, June 12, 2011, revealed LV with normal in     size and systolic function.  EF was 55%-60%.  No regional wall     motion abnormalities.  No atrial septum defect or patent foramen     ovale noted.  Mitral valve was structurally normal, mobility was     not restricted.  No evidence of stenosis or regurgitation.  HOSPITAL COURSE:  This is a 35 year old female who came in with complaints of pain in the left upper chest and numbness of the left arm, this has been going on at home for about 1 hour prior to her presenting to the ER.  She did not have any relief with the first nitroglycerin, but apparently did with subsequent nitroglycerin.  The patient was admitted and ruled out for an MI with 3 sets of cardiac enzymes.  D- dimer was only mildly elevated at 0.65.  CT of her chest was not done as the patient was not dyspneic or hypoxic.  Pain actually went away.  On further discussion with the patient, she admits to having severe anxiety and panic  attacks and has had chest pain multiple times in the past every time she had a panic attack.  About 5 years ago, she had a stress test, which was negative.  The patient admitted to having lost her insurance and since then was taking her leftover Prozac intermittently thinking that it would help with an acute attack.  She also had some leftover Klonopin, which apparently were initially helping but eventually their effect had worn off and they were no longer effective for her panic attacks.  I did request a psych eval, she was evaluated by Dr. Rogers Blocker, and he has decided to place her back on Prozac and Klonopin and asked her to follow up with [facility].  The patient is in agreement with the above plan.  On discharge, lungs are clear.  Heart, regular rate and rhythm.  No murmurs.  Abdomen, obese, soft, nontender, nondistended.  Bowel sounds positive. Extremities, no cyanosis, clubbing, or edema.  Psychologically, she has a flat  affect.  CONDITION ON DISCHARGE:  Stable.  Of note,  the patient stated that she has mitral valve prolapse but the echo  did not reveal this.  She also is requesting Klonopin refills and I have written a prescription for regular Klonopin as well.  Time on the patient's care today is 50 minutes.     Calvert Cantor, M.D.     SR/MEDQ  D:  06/14/2011  T:  06/15/2011  Job:  [id number]
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
## 2011-07-06 NOTE — Discharge Summary (Signed)
NAMEDREANA, BRITZ                  ACCOUNT NO.:  [id number]  MEDICAL RECORD NO.:  [id number]  LOCATION:  1412                         FACILITY:  [facility]  PHYSICIAN:  Calvert Cantor, M.D.     DATE OF BIRTH:  06/12/1976  DATE OF ADMISSION:  06/11/2011 DATE OF DISCHARGE:  06/14/2011                              DISCHARGE SUMMARY   PRIMARY CARE PHYSICIAN:  None.  The patient has no insurance and has not been to a PCP.  CHIEF COMPLAINT:  Chest pain.  DISCHARGE DIAGNOSES: 1. Chest pain secondary to anxiety. 2. History of mitral valve prolapse, not noted on echo during this     hospital stay. 3. Scoliosis. 4. Anxiety and depression. 5. Morbid obesity.  DISCHARGE MEDICATIONS:  New medication: 1. Klonopin 1 mg tab, take half to whole milligram every 8 hours as     needed for a panic attack. 2. I have given her 2 prescriptions, one is for [employer].  If     she cannot afford this, then she is to take the regular Klonopin. 3. Continue the following Prozac 20 mg every morning as needed.     Patient was on this before, but was not taking every morning as     needed, every morning routine.  Stopped taking the following     evening primrose oil.  CONSULTS DURING HOSPITAL STAY:  Psychiatric consult by Eulogio Ditch, MD  PROCEDURES:  2-D echo:  LV cavity size was normal.  Systolic function was normal.  EF was within the range of 55% to 60%.  Wall motion was normal without any wall motion abnormalities.  Atrial septum, no defect or patent foramen ovale was identified.  Mitral valve appeared structurally normal.  Mobility was not restricted.  There is no evidence of stenosis or regurgitation.  HOSPITAL COURSE:  This is a 35 year old female, who developed chest pain in the left side of her chest radiating to her shoulder and down her left arm with numbness and tingling of her fingers.  She admitted to diaphoresis as well as shortness of breath and palpitations.  The patient was  admitted and ruled out with 3 sets of cardiac enzymes. The following morning after my discussion with her, patient admitted to having a lot of anxiety.  Her mother and aunt were in the room, who states that the patient has been under a lot of stress lately and they feel that she is at more than she can cope with.  In the past, the patient has had chest pain secondary to stress.  She has panic attacks as well.  She was taking Klonopin at home until she ran out of this.  At times, Klonopin does not help.  She was also taking Prozac, but very irregularly, she thought it was to be taken p.r.n.  The patient lost her insurance and has not been able to follow up with the PCP or her psychiatrist.  We were able to get a psych eval during her hospital stay.  Dr. Rogers Blocker recommended keeping her on Prozac and resuming her Klonopin.  She will have a Mental Health appointment set up for her.  In addition, we have  given her information about [facility] and phone numbers to call for eligibility.  PHYSICAL EXAMINATION:  HEART:  Regular rate and rhythm.  No murmurs. LUNGS:  Clear bilaterally. ABDOMEN:  Obese, soft, nontender, and nondistended.  Bowel sounds positive. EXTREMITIES:  No cyanosis, clubbing, or edema.  CONDITION ON DISCHARGE:  Stable.  Follow up as above with Mental Health and [facility].  TIME ON PATIENT CARE:  40 minutes.     Calvert Cantor, M.D.     SR/MEDQ  D:  06/26/2011  T:  06/27/2011  Job:  [id number]  Electronically Signed by Calvert Cantor M.D. on 07/06/2011 10:49:45 AM

## 2011-08-20 LAB — CBC
Hemoglobin: 12.2
MCHC: 33.6
RDW: 14.3 — ABNORMAL HIGH

## 2011-08-20 LAB — BASIC METABOLIC PANEL
CO2: 27
Calcium: 9.4
Creatinine, Ser: 0.6
Glucose, Bld: 87

## 2011-08-20 LAB — DIFFERENTIAL
Basophils Relative: 1
Eosinophils Absolute: 0.2
Monocytes Relative: 10
Neutrophils Relative %: 58

## 2012-01-09 ENCOUNTER — Emergency Department (HOSPITAL_COMMUNITY): Payer: Self-pay

## 2012-01-09 ENCOUNTER — Emergency Department (HOSPITAL_COMMUNITY)
Admission: EM | Admit: 2012-01-09 | Discharge: 2012-01-09 | Disposition: A | Discharge status: Home / Self Care | Payer: Self-pay | Attending: Emergency Medicine | Admitting: Emergency Medicine

## 2012-01-09 ENCOUNTER — Encounter (HOSPITAL_COMMUNITY): Payer: Self-pay | Admitting: *Deleted

## 2012-01-09 DIAGNOSIS — W010XXA Fall on same level from slipping, tripping and stumbling without subsequent striking against object, initial encounter: Secondary | ICD-10-CM | POA: Insufficient documentation

## 2012-01-09 DIAGNOSIS — S8990XA Unspecified injury of unspecified lower leg, initial encounter: Secondary | ICD-10-CM | POA: Insufficient documentation

## 2012-01-09 DIAGNOSIS — M4120 Other idiopathic scoliosis, site unspecified: Secondary | ICD-10-CM | POA: Insufficient documentation

## 2012-01-09 DIAGNOSIS — M25569 Pain in unspecified knee: Secondary | ICD-10-CM | POA: Insufficient documentation

## 2012-01-09 DIAGNOSIS — I059 Rheumatic mitral valve disease, unspecified: Secondary | ICD-10-CM | POA: Insufficient documentation

## 2012-01-09 DIAGNOSIS — M25561 Pain in right knee: Secondary | ICD-10-CM

## 2012-01-09 MED ORDER — HYDROCODONE-ACETAMINOPHEN 5-325 MG PO TABS: 1.0000 | ORAL_TABLET | ORAL | Status: AC | PRN

## 2012-01-09 MED ORDER — MORPHINE SULFATE 4 MG/ML IJ SOLN
4.0000 mg | Freq: Once | INTRAMUSCULAR | Status: AC
  Administered 2012-01-09: 4 mg via INTRAMUSCULAR
  Filled 2012-01-09: qty 1

## 2012-01-09 NOTE — ED Provider Notes (Signed)
History     CSN: [id number]  Arrival date & time 01/09/12  2043   First MD Initiated Contact with Patient 01/09/12 2103      Chief Complaint  Patient presents with  . Knee Injury    pt reports falling in shower on saturday and injuring right knee. pt c/o increased pain with weight bearing.     (Consider location/radiation/quality/duration/timing/severity/associated sxs/prior treatment) The history is provided by the patient.  36 year old female presents with right knee pain. She states that she slipped and fell in the shower on Saturday, and has had intermittent pain to the knee since that time. She had been taking ibuprofen, which she thought seem to be helping. She states that she was walking across her living room this evening when she suddenly felt as if the knee "caught" and she did not feel able to bear weight on the extremity. 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 Has had problems in the past with sprains to the knee. Denies distal numbness, weakness, tingling. She's not noticed any gross deformity or swelling to the knee.   Past Medical History  Diagnosis Date  . Panic attacks   . Scoliosis   . Mitral valve prolapse   . Asthma     As a child    Past Surgical History  Procedure Date  . Tubal ligation   . Laparoscopy   . Cesarean section   . Cholecystectomy     Family History  Problem Relation Age of Onset  . Hypertension Mother   . Diabetes Mother   . Hypertension Father   . Diabetes Father     History  Substance Use Topics  . Smoking status: Never Smoker   . Smokeless tobacco: Not on file  . Alcohol Use: No    OB History    Grav Para Term Preterm Abortions TAB SAB Ect Mult Living                  Review of Systems    Constitutional: Negative for fever, chills, activity change and appetite change.  Musculoskeletal: Positive for arthralgias and gait problem. Negative for joint swelling.  Skin: Negative.   Neurological: Negative for weakness and numbness.    Allergies  Amlodipine besylate and Sulfonamide derivatives  Home Medications   Current Outpatient Rx  Name Route Sig Dispense Refill  . FLUOXETINE HCL 20 MG PO CAPS Oral Take 20 mg by mouth daily.     Marland Kitchen NITROGLYCERIN 0.4 MG SL SUBL Sublingual Place 0.4 mg under the tongue as directed.        BP 132/98  Pulse 77  Temp(Src) 98.5 F (36.9 C) (Oral)  Resp 20  Wt 169 lb (76.658 kg)  SpO2 100%  LMP 12/31/2011  Physical Exam  Nursing note and vitals reviewed. Constitutional: She is oriented to person, place, and time. She appears well-developed and well-nourished. No distress.  HENT:  Head: Normocephalic and atraumatic.  Cardiovascular: Normal rate.   Pulmonary/Chest: Effort normal.  Musculoskeletal:       Right knee: Exam limited due to pain. Patient able to flex knee to approximately 150 to 160 before experiencing significant pain. There are no gross edema, ecchymoses, deformity, crepitus noted. No evidence for a patellar dislocation. There is no gross  evidence of ligamentous laxity as assessed via drawer/stress testing. Diffusely tender to palpation.   Neurological: She is alert and oriented to person, place, and time.       Bilateral lower extremities are neurovascularly intact with sensory intact to light touch. DP/PT pulses intact. Capillary refill less than 3 seconds.  Skin: Skin is warm and dry. She is not diaphoretic.  Psychiatric: She has a normal mood and affect.    ED Course  Procedures (including critical care time)  Labs Reviewed - No data to display Dg Knee Complete 4 Views Right  01/09/2012  *RADIOLOGY REPORT*  Clinical Data: Right knee pain post fall  RIGHT KNEE - COMPLETE 4+ VIEW  Comparison: 08/02/2009  Findings: Bone  mineralization normal. Joint spaces preserved. No fracture, dislocation, or bone destruction. No joint effusion.  IMPRESSION: Normal exam.  Original Report Authenticated By: Lollie Marrow, M.D.     1. Knee pain, right       MDM  Patient with history of patellar dislocation in the past presents with right knee pain. She had an injury to the knee this weekend, and re-injured the knee just prior to presentation. There is no gross evidence on exam for patellar dislocation; no gross ligamental laxity. Plain films, which I reviewed, were unremarkable. Suspect knee sprain vs ?meniscal pathology. Patient was placed in a knee immobilizer and given crutches. She was instructed to make a followup appointment with orthopedics for further evaluation and treatment. Return precautions discussed. She is agreeable to plan.        [place], [place] 01/10/12 [phone]

## 2012-01-09 NOTE — Discharge Instructions (Signed)
Your x-ray did not show that you have a broken bone or that your kneecap is dislocated. Please keep the knee immobilizer on until you are able to be seen by orthopedics. Keep the leg elevated and keep ice on the knee. Return to the ED for worsening condition.  RESOURCE GUIDE  Dental Problems  Patients with Medicaid: [facility] [phone] [address]                                           [phone] [address] Phone:  [phone]                                                  Phone:  [phone]  If unable to pay or uninsured, contact:  [facility] or [facility]. to become qualified for the adult dental clinic.  Chronic Pain Problems Contact [facility]  [phone] Patients need to be referred by their primary care doctor.  Insufficient Money for Medicine Contact [employer] Way:  call "211" or [facility] [phone]) 583-8870.  No Primary Care Doctor Call [facility]  [phone] [phone] Other agencies that provide inexpensive medical care    [facility]  [phone]    [facility]  7246034056    [facility]  [phone]    [facility]  [phone]    [facility]  [phone]    [facility]  [phone]  Psychological Services [facility]  [phone] [facility]  [phone] [facility]   [phone] (emergency services [phone])  Substance Abuse Resources [facility]  682-099-5150 [facility] 2674857163 [employer] 650-024-2148 Floydene Flock (365)[phone] Residential & Outpatient Substance Abuse Program  779-174-4098  Abuse/Neglect [facility] Child Abuse Hotline 819-557-0691 [facility] Child Abuse Hotline 4692255687 (After Hours)  Emergency Shelter [facility] 8162839536  M[facility] 407-144-1171 [facility]  Services (331)470-3059  MRSA Hotline #:   782-567-2569    [facility] Resources  [facility]     United Way                          [facility] Dept. [address] [place]                       [address]      [address]  [place]                                                Cristobal Goldmann Phone:  [phone]  Phone:  [phone]                 Phone:  [phone]  [facility] Phone:  [phone]  [facility] Child Abuse Hotline 469-106-9544 (364) 264-0603 (After Hours)  Patella Problems (Patellofemoral Syndrome) This syndrome is caused by changes in the undersurface of the kneecap (patella). The changes vary from minor inflammation to major changes such as breakdown of the cartilage on the undersurface of the patella. The major changes can be seen with an arthroscope (a small, pencil-sized telescope). These changes can result from various factors. These factors may arise from abnormal tracking (movement or malalignment) of the patella. Normally the Patella is in its normal groove located between the condyles (grooved end) of the femur (thigh bone). Abnormal movement leads to increased pressure in the patellofemoral joint. This leads to swelling in the cartilage, inflammation and pain. SYMPTOMS  The patient with this syndrome usually has an ache in the knee. It is often aggravated by:  Prolonged sitting.   Squatting.   Climbing stairs.   Running down hill.   Other exercising that stresses the knee.  Other findings may include the knee giving way, swelling, and or locking. TREATMENT  The treatment will depend on the cause of the problem. Sometimes the solution is as simple as cutting down on activities. Giving your joint a rest with the use of crutches and braces can also help. This is generally followed by strengthening  exercises. RECOVERY Recovery from a patellar problem depends on the type of problem in your knee and on the treatment required. If conservative treatment works the recovery period may be as little as three to four weeks. If more aggressive therapy such as surgery is required, the recovery period may be several months. Your caregiver will discuss this with you. HOME CARE INSTRUCTIONS  Following exercise, use an ice pack for twenty to thirty minutes three to four times per day. Use a towel between your ice pack and the skin.   Reduction of inflammation with anti-inflammatories may be helpful. Only take over-the-counter or prescription medicines for pain, discomfort, or fever as directed by your caregiver.   Taping the knee or using a neoprene sleeve with a patellar cutout to provide better tracking of the patella may give relief.   Muscle (quadriceps) strengthening exercises are helpful. Follow your caregiver's advice.   Muscle stretching prior to exercise may be helpful.   Soft tissue therapy using ultrasound, and diathermy may be helpful.   If conservative therapy is not effective, surgery may provide relief. During arthroscopy, your caregiver may discover a rough surface beneath your kneecap. If this happens, your caregiver may smooth this out by shaving the surface.  SEEK MEDICAL CARE IF: If you have surgery, see your caregiver if:  There is increased bleeding or clear fluid (more than a small spot) from the wound.   You notice redness, swelling, or increasing pain in the wound.   Pus is coming from wound.   You develop an unexplained oral temperature above 102 F (38.9 C) develops, or as your caregiver suggests.   You notice a foul smell coming from the wound or dressing.   You develop increasing pain or stiffness in your knee.  SEEK IMMEDIATE MEDICAL CARE IF:   You develop a rash.   You have difficulty breathing.   You have any allergic problems.  MAKE SURE YOU:    Understand these instructions.   Will watch your condition.   Will  get help right away if you are not doing well or get worse.  Document Released: 10/19/2000 Document Revised: 10/11/2011 Document Reviewed: 11/08/2008 [facility] Patient Information 2012 [place], [place].Knee Pain The knee is the complex joint between your thigh and your lower leg. It is made up of bones, tendons, ligaments, and cartilage. The bones that make up the knee are:  The femur in the thigh.   The tibia and fibula in the lower leg.   The patella or kneecap riding in the groove on the lower femur.  CAUSES  Knee pain is a common complaint with many causes. A few of these causes are:  Injury, such as:   A ruptured ligament or tendon injury.   Torn cartilage.   Medical conditions, such as:   Gout   Arthritis   Infections   Overuse, over training or overdoing a physical activity.  Knee pain can be minor or severe. Knee pain can accompany debilitating injury. Minor knee problems often respond well to self-care measures or get well on their own. More serious injuries may need medical intervention or even surgery. SYMPTOMS The knee is complex. Symptoms of knee problems can vary widely. Some of the problems are:  Pain with movement and weight bearing.   Swelling and tenderness.   Buckling of the knee.   Inability to straighten or extend your knee.   Your knee locks and you cannot straighten it.   Warmth and redness with pain and fever.   Deformity or dislocation of the kneecap.  DIAGNOSIS  Determining what is wrong may be very straight forward such as when there is an injury. It can also be challenging because of the complexity of the knee. Tests to make a diagnosis may include:  Your caregiver taking a history and doing a physical exam.   Routine X-rays can be used to rule out other problems. X-rays will not reveal a cartilage tear. Some injuries of the knee can be diagnosed by:   Arthroscopy a  surgical technique by which a small video camera is inserted through tiny incisions on the sides of the knee. This procedure is used to examine and repair internal knee joint problems. Tiny instruments can be used during arthroscopy to repair the torn knee cartilage (meniscus).   Arthrography is a radiology technique. A contrast liquid is directly injected into the knee joint. Internal structures of the knee joint then become visible on X-ray film.   An MRI scan is a non x-ray radiology procedure in which magnetic fields and a computer produce two- or three-dimensional images of the inside of the knee. Cartilage tears are often visible using an MRI scanner. MRI scans have largely replaced arthrography in diagnosing cartilage tears of the knee.   Blood work.   Examination of the fluid that helps to lubricate the knee joint (synovial fluid). This is done by taking a sample out using a needle and a syringe.  TREATMENT The treatment of knee problems depends on the cause. Some of these treatments are:  Depending on the injury, proper casting, splinting, surgery or physical therapy care will be needed.   Give yourself adequate recovery time. Do not overuse your joints. If you begin to get sore during workout routines, back off. Slow down or do fewer repetitions.   For repetitive activities such as cycling or running, maintain your strength and nutrition.   Alternate muscle groups. For example if you are a weight lifter, work the upper body on one day and the lower body  the next.   Either tight or weak muscles do not give the proper support for your knee. Tight or weak muscles do not absorb the stress placed on the knee joint. Keep the muscles surrounding the knee strong.   Take care of mechanical problems.   If you have flat feet, orthotics or special shoes may help. See your caregiver if you need help.   Arch supports, sometimes with wedges on the inner or outer aspect of the heel, can help.  These can shift pressure away from the side of the knee most bothered by osteoarthritis.   A brace called an "unloader" brace also may be used to help ease the pressure on the most arthritic side of the knee.   If your caregiver has prescribed crutches, braces, wraps or ice, use as directed. The acronym for this is PRICE. This means protection, rest, ice, compression and elevation.   Nonsteroidal anti-inflammatory drugs (NSAID's), can help relieve pain. But if taken immediately after an injury, they may actually increase swelling. Take NSAID's with food in your stomach. Stop them if you develop stomach problems. Do not take these if you have a history of ulcers, stomach pain or bleeding from the bowel. Do not take without your caregiver's approval if you have problems with fluid retention, heart failure, or kidney problems.   For ongoing knee problems, physical therapy may be helpful.   Glucosamine and chondroitin are over-the-counter dietary supplements. Both may help relieve the pain of osteoarthritis in the knee. These medicines are different from the usual anti-inflammatory drugs. Glucosamine may decrease the rate of cartilage destruction.   Injections of a corticosteroid drug into your knee joint may help reduce the symptoms of an arthritis flare-up. They may provide pain relief that lasts a few months. You may have to wait a few months between injections. The injections do have a small increased risk of infection, water retention and elevated blood sugar levels.   Hyaluronic acid injected into damaged joints may ease pain and provide lubrication. These injections may work by reducing inflammation. A series of shots may give relief for as long as 6 months.   Topical painkillers. Applying certain ointments to your skin may help relieve the pain and stiffness of osteoarthritis. Ask your pharmacist for suggestions. Many over the-counter products are approved for temporary relief of arthritis pain.    In some countries, doctors often prescribe topical NSAID's for relief of chronic conditions such as arthritis and tendinitis. A review of treatment with NSAID creams found that they worked as well as oral medications but without the serious side effects.  PREVENTION  Maintain a healthy weight. Extra pounds put more strain on your joints.   Get strong, stay limber. Weak muscles are a common cause of knee injuries. Stretching is important. Include flexibility exercises in your workouts.   Be smart about exercise. If you have osteoarthritis, chronic knee pain or recurring injuries, you may need to change the way you exercise. This does not mean you have to stop being active. If your knees ache after jogging or playing basketball, consider switching to swimming, water aerobics or other low-impact activities, at least for a few days a week. Sometimes limiting high-impact activities will provide relief.   Make sure your shoes fit well. Choose footwear that is right for your sport.   Protect your knees. Use the proper gear for knee-sensitive activities. Use kneepads when playing volleyball or laying carpet. Buckle your seat belt every time you drive. Most shattered kneecaps  occur in car accidents.   Rest when you are tired.  SEEK MEDICAL CARE IF:  You have knee pain that is continual and does not seem to be getting better.  SEEK IMMEDIATE MEDICAL CARE IF:  Your knee joint feels hot to the touch and you have a high fever. MAKE SURE YOU:   Understand these instructions.   Will watch your condition.   Will get help right away if you are not doing well or get worse.  Document Released: 08/19/2007 Document Revised: 10/11/2011 Document Reviewed: 08/19/2007 [facility] Patient Information 2012 [place], [place].

## 2012-01-11 NOTE — ED Provider Notes (Signed)
Medical screening examination/treatment/procedure(s) were performed by non-physician practitioner and as supervising physician I was immediately available for consultation/collaboration.  Nyala Kirchner, MD 01/11/12 0039 

## 2012-04-25 ENCOUNTER — Other Ambulatory Visit: Payer: Self-pay | Admitting: Family Medicine

## 2012-04-25 DIAGNOSIS — M5416 Radiculopathy, lumbar region: Secondary | ICD-10-CM

## 2012-04-29 ENCOUNTER — Ambulatory Visit
Admission: RE | Admit: 2012-04-29 | Discharge: 2012-04-29 | Disposition: A | Discharge status: Home / Self Care | Payer: PRIVATE HEALTH INSURANCE | Source: Ambulatory Visit | Attending: Family Medicine | Admitting: Family Medicine

## 2012-04-29 DIAGNOSIS — M5416 Radiculopathy, lumbar region: Secondary | ICD-10-CM

## 2012-12-09 ENCOUNTER — Emergency Department (HOSPITAL_COMMUNITY)

## 2012-12-09 ENCOUNTER — Encounter (HOSPITAL_COMMUNITY): Payer: Self-pay

## 2012-12-09 ENCOUNTER — Emergency Department (HOSPITAL_COMMUNITY)
Admission: EM | Admit: 2012-12-09 | Discharge: 2012-12-09 | Disposition: A | Discharge status: Home / Self Care | Attending: Emergency Medicine | Admitting: Emergency Medicine

## 2012-12-09 DIAGNOSIS — Z79899 Other long term (current) drug therapy: Secondary | ICD-10-CM | POA: Insufficient documentation

## 2012-12-09 DIAGNOSIS — IMO0001 Reserved for inherently not codable concepts without codable children: Secondary | ICD-10-CM | POA: Insufficient documentation

## 2012-12-09 DIAGNOSIS — R52 Pain, unspecified: Secondary | ICD-10-CM | POA: Insufficient documentation

## 2012-12-09 DIAGNOSIS — R197 Diarrhea, unspecified: Secondary | ICD-10-CM | POA: Insufficient documentation

## 2012-12-09 DIAGNOSIS — J45909 Unspecified asthma, uncomplicated: Secondary | ICD-10-CM | POA: Insufficient documentation

## 2012-12-09 DIAGNOSIS — R05 Cough: Secondary | ICD-10-CM

## 2012-12-09 DIAGNOSIS — J029 Acute pharyngitis, unspecified: Secondary | ICD-10-CM | POA: Insufficient documentation

## 2012-12-09 DIAGNOSIS — Z8659 Personal history of other mental and behavioral disorders: Secondary | ICD-10-CM | POA: Insufficient documentation

## 2012-12-09 DIAGNOSIS — R059 Cough, unspecified: Secondary | ICD-10-CM | POA: Insufficient documentation

## 2012-12-09 DIAGNOSIS — R079 Chest pain, unspecified: Secondary | ICD-10-CM | POA: Insufficient documentation

## 2012-12-09 DIAGNOSIS — B349 Viral infection, unspecified: Secondary | ICD-10-CM

## 2012-12-09 DIAGNOSIS — Z8679 Personal history of other diseases of the circulatory system: Secondary | ICD-10-CM | POA: Insufficient documentation

## 2012-12-09 DIAGNOSIS — R509 Fever, unspecified: Secondary | ICD-10-CM | POA: Insufficient documentation

## 2012-12-09 DIAGNOSIS — R498 Other voice and resonance disorders: Secondary | ICD-10-CM | POA: Insufficient documentation

## 2012-12-09 DIAGNOSIS — B9789 Other viral agents as the cause of diseases classified elsewhere: Secondary | ICD-10-CM | POA: Insufficient documentation

## 2012-12-09 DIAGNOSIS — Z8739 Personal history of other diseases of the musculoskeletal system and connective tissue: Secondary | ICD-10-CM | POA: Insufficient documentation

## 2012-12-09 LAB — CBC WITH DIFFERENTIAL/PLATELET
Basophils Absolute: 0 10*3/uL (ref 0.0–0.1)
Basophils Relative: 0 % (ref 0–1)
Eosinophils Absolute: 0.2 10*3/uL (ref 0.0–0.7)
Lymphs Abs: 1.7 10*3/uL (ref 0.7–4.0)
MCH: 27.9 pg (ref 26.0–34.0)
MCHC: 33.2 g/dL (ref 30.0–36.0)
Neutrophils Relative %: 34 % — ABNORMAL LOW (ref 43–77)
Platelets: 355 10*3/uL (ref 150–400)
RBC: 4.34 MIL/uL (ref 3.87–5.11)
RDW: 14.4 % (ref 11.5–15.5)

## 2012-12-09 LAB — RAPID STREP SCREEN (MED CTR MEBANE ONLY): Streptococcus, Group A Screen (Direct): NEGATIVE

## 2012-12-09 LAB — COMPREHENSIVE METABOLIC PANEL
ALT: 14 U/L (ref 0–35)
AST: 18 U/L (ref 0–37)
Albumin: 3.2 g/dL — ABNORMAL LOW (ref 3.5–5.2)
Calcium: 8.2 mg/dL — ABNORMAL LOW (ref 8.4–10.5)
Sodium: 136 mEq/L (ref 135–145)
Total Protein: 7 g/dL (ref 6.0–8.3)

## 2012-12-09 MED ORDER — ALBUTEROL SULFATE (5 MG/ML) 0.5% IN NEBU
2.5000 mg | INHALATION_SOLUTION | Freq: Once | RESPIRATORY_TRACT | Status: AC
  Administered 2012-12-09: 2.5 mg via RESPIRATORY_TRACT
  Filled 2012-12-09 (×2): qty 0.5

## 2012-12-09 MED ORDER — ALBUTEROL SULFATE HFA 108 (90 BASE) MCG/ACT IN AERS
2.0000 | INHALATION_SPRAY | RESPIRATORY_TRACT | Status: DC | PRN
  Administered 2012-12-09: 2 via RESPIRATORY_TRACT
  Filled 2012-12-09: qty 6.7

## 2012-12-09 MED ORDER — HYDROCOD POLST-CHLORPHEN POLST 10-8 MG/5ML PO LQCR: 5.0000 mL | Freq: Two times a day (BID) | ORAL | Status: DC | PRN

## 2012-12-09 MED ORDER — SODIUM CHLORIDE 0.9 % IV BOLUS (SEPSIS)
1000.0000 mL | Freq: Once | INTRAVENOUS | Status: AC
  Administered 2012-12-09: 1000 mL via INTRAVENOUS

## 2012-12-09 MED ORDER — HYDROCOD POLST-CHLORPHEN POLST 10-8 MG/5ML PO LQCR
5.0000 mL | Freq: Once | ORAL | Status: AC
  Administered 2012-12-09: 5 mL via ORAL
  Filled 2012-12-09: qty 5

## 2012-12-09 NOTE — ED Provider Notes (Signed)
Medical screening examination/treatment/procedure(s) were performed by non-physician practitioner and as supervising physician I was immediately available for consultation/collaboration.   Saida Lonon, MD 12/09/12 2222 

## 2012-12-09 NOTE — ED Provider Notes (Signed)
History     CSN: [id number]  Arrival date & time 12/09/12  1512   First MD Initiated Contact with Patient 12/09/12 1605      Chief Complaint  Patient presents with  . Cough  . Generalized Body Aches  . Fever    (Consider location/radiation/quality/duration/timing/severity/associated sxs/prior treatment) HPI Comments: Patient reports that on Friday ( 5 days ago) she started with a sore throat that developed the next day into a deep cough, fever to 101.5, and generalized myalgias.  Today developed diarrhea.  Has been taking OTC medications without relief.  Has no known ill contacts, did not get flu immunizations today.  Patient is a 37 y.o. female presenting with cough and fever. The history is provided by the patient.  Cough This is a new problem. The current episode started more than 2 days ago. The problem occurs constantly. The problem has been gradually worsening. The cough is non-productive. The maximum temperature recorded prior to her arrival was 101 to 101.9 F. Associated symptoms include chest pain and sore throat. Pertinent negatives include no chills, no sweats, no weight loss, no ear congestion, no ear pain, no headaches, no rhinorrhea, no myalgias and no wheezing. She is not a smoker. Her past medical history does not include bronchitis, pneumonia, COPD or asthma.  Fever Primary symptoms of the febrile illness include fever, cough and diarrhea. Primary symptoms do not include headaches, wheezing, nausea, dysuria, myalgias or rash.    Past Medical History  Diagnosis Date  . Panic attacks   . Scoliosis   . Mitral valve prolapse   . Asthma     As a child    Past Surgical History  Procedure Date  . Tubal ligation   . Laparoscopy   . Cesarean section   . Cholecystectomy   . Knee surgery   . Back surgery     Family History  Problem Relation Age of Onset  . Hypertension Mother   . Diabetes Mother   . Hypertension Father   . Diabetes Father     History   Substance Use Topics  . Smoking status: Never Smoker   . Smokeless tobacco: Never Used  . Alcohol Use: No    OB History    Grav Para Term Preterm Abortions TAB SAB Ect Mult Living                  Review of Systems  Constitutional: Positive for fever. Negative for chills and weight loss.  HENT: Positive for sore throat and voice change. Negative for ear pain, congestion, rhinorrhea, trouble swallowing, neck pain and neck stiffness.   Respiratory: Positive for cough. Negative for wheezing.   Cardiovascular: Positive for chest pain.  Gastrointestinal: Positive for diarrhea. Negative for nausea.  Genitourinary: Negative for dysuria.  Musculoskeletal: Negative for myalgias and back pain.  Skin: Negative for rash.  Neurological: Negative for dizziness, weakness and headaches.    Allergies  Amlodipine besylate and Sulfonamide derivatives  Home Medications   Current Outpatient Rx  Name  Route  Sig  Dispense  Refill  . FLUOXETINE HCL 20 MG PO CAPS   Oral   Take 20 mg by mouth daily.          . IBUPROFEN 200 MG PO TABS   Oral   Take 400 mg by mouth every 6 (six) hours as needed.         Marland Kitchen NITROGLYCERIN 0.4 MG SL SUBL   Sublingual   Place 0.4 mg under the tongue  as directed.           Marland Kitchen PSEUDOEPH-DOXYLAMINE-DM-APAP 60-12.04-03-999 MG/30ML PO LIQD   Oral   Take 30 mLs by mouth 2 (two) times daily as needed. Flu-like symptoms         . HYDROCOD POLST-CPM POLST ER 10-8 MG/5ML PO LQCR   Oral   Take 5 mLs by mouth every 12 (twelve) hours as needed.   140 mL   0     BP 133/83  Pulse 73  Temp 98.5 F (36.9 C) (Oral)  Resp 22  SpO2 94%  LMP 11/25/2012  Physical Exam  Constitutional: She is oriented to person, place, and time. She appears well-developed and well-nourished.  HENT:  Head: Normocephalic and atraumatic.  Mouth/Throat: Oropharynx is clear and moist.  Eyes: Pupils are equal, round, and reactive to light.  Neck: Normal range of motion. Neck  supple. No thyromegaly present.  Cardiovascular: Normal rate and regular rhythm.   Pulmonary/Chest: Effort normal and breath sounds normal.  Lymphadenopathy:    She has no cervical adenopathy.  Neurological: She is alert and oriented to person, place, and time.  Skin: Skin is warm and dry. No rash noted.    ED Course  Procedures (including critical care time)  Labs Reviewed  CBC WITH DIFFERENTIAL - Abnormal; Notable for the following:    WBC 3.8 (*)     Neutrophils Relative 34 (*)     Neutro Abs 1.3 (*)     Monocytes Relative 16 (*)     All other components within normal limits  COMPREHENSIVE METABOLIC PANEL - Abnormal; Notable for the following:    Calcium 8.2 (*)     Albumin 3.2 (*)     Total Bilirubin 0.2 (*)     All other components within normal limits  RAPID STREP SCREEN   Dg Chest 2 View  12/09/2012  *RADIOLOGY REPORT*  Clinical Data: Cough.  Short of breath.  Chest pain.  Fever.  CHEST - 2 VIEW  Comparison: 06/12/2011  Findings: Mild cardiomegaly.  Normal vascularity.  Clear lungs.  No pleural effusion.  No pneumothorax.  Dextroscoliosis unchanged. Mild bronchitic changes are stable.  IMPRESSION: Chronic cardiomegaly and bronchitic changes.  No active cardiopulmonary disease.   Original Report Authenticated By: Jolaine Click, M.D.      1. Viral syndrome   2. Cough       MDM   Will hydrate, obtain CBC and I-stat, rapid strep although most likely influenza        Arman Filter, NP 12/09/12 2013

## 2012-12-09 NOTE — ED Notes (Signed)
Patient c/o body aches, dizziness, cough, fever, and diarrhea x 3 days.

## 2012-12-09 NOTE — ED Notes (Signed)
Pt refused to let Phlebotomist stick her hand. Nurse tried to stick arm with no success. Main lab called for stick.

## 2013-06-09 ENCOUNTER — Emergency Department (HOSPITAL_COMMUNITY)
Admission: EM | Admit: 2013-06-09 | Discharge: 2013-06-09 | Disposition: A | Discharge status: Home / Self Care | Attending: Emergency Medicine | Admitting: Emergency Medicine

## 2013-06-09 ENCOUNTER — Encounter (HOSPITAL_COMMUNITY): Payer: Self-pay

## 2013-06-09 ENCOUNTER — Emergency Department (HOSPITAL_COMMUNITY)

## 2013-06-09 DIAGNOSIS — F410 Panic disorder [episodic paroxysmal anxiety] without agoraphobia: Secondary | ICD-10-CM | POA: Insufficient documentation

## 2013-06-09 DIAGNOSIS — N189 Chronic kidney disease, unspecified: Secondary | ICD-10-CM | POA: Insufficient documentation

## 2013-06-09 DIAGNOSIS — Z8739 Personal history of other diseases of the musculoskeletal system and connective tissue: Secondary | ICD-10-CM | POA: Insufficient documentation

## 2013-06-09 DIAGNOSIS — M545 Low back pain, unspecified: Secondary | ICD-10-CM | POA: Insufficient documentation

## 2013-06-09 DIAGNOSIS — Z8679 Personal history of other diseases of the circulatory system: Secondary | ICD-10-CM | POA: Insufficient documentation

## 2013-06-09 DIAGNOSIS — Z79899 Other long term (current) drug therapy: Secondary | ICD-10-CM | POA: Insufficient documentation

## 2013-06-09 DIAGNOSIS — Z87448 Personal history of other diseases of urinary system: Secondary | ICD-10-CM | POA: Insufficient documentation

## 2013-06-09 DIAGNOSIS — J45909 Unspecified asthma, uncomplicated: Secondary | ICD-10-CM | POA: Insufficient documentation

## 2013-06-09 DIAGNOSIS — Z87440 Personal history of urinary (tract) infections: Secondary | ICD-10-CM | POA: Insufficient documentation

## 2013-06-09 DIAGNOSIS — R609 Edema, unspecified: Secondary | ICD-10-CM | POA: Insufficient documentation

## 2013-06-09 DIAGNOSIS — I129 Hypertensive chronic kidney disease with stage 1 through stage 4 chronic kidney disease, or unspecified chronic kidney disease: Secondary | ICD-10-CM | POA: Insufficient documentation

## 2013-06-09 DIAGNOSIS — G8929 Other chronic pain: Secondary | ICD-10-CM

## 2013-06-09 DIAGNOSIS — Z9889 Other specified postprocedural states: Secondary | ICD-10-CM | POA: Insufficient documentation

## 2013-06-09 DIAGNOSIS — R109 Unspecified abdominal pain: Secondary | ICD-10-CM | POA: Insufficient documentation

## 2013-06-09 DIAGNOSIS — Z3202 Encounter for pregnancy test, result negative: Secondary | ICD-10-CM | POA: Insufficient documentation

## 2013-06-09 DIAGNOSIS — I509 Heart failure, unspecified: Secondary | ICD-10-CM | POA: Insufficient documentation

## 2013-06-09 DIAGNOSIS — Z9851 Tubal ligation status: Secondary | ICD-10-CM | POA: Insufficient documentation

## 2013-06-09 HISTORY — DX: Urinary tract infection, site not specified: N39.0

## 2013-06-09 HISTORY — DX: Essential (primary) hypertension: I10

## 2013-06-09 HISTORY — DX: Disorder of kidney and ureter, unspecified: N28.9

## 2013-06-09 LAB — CBC WITH DIFFERENTIAL/PLATELET
Eosinophils Absolute: 0.3 10*3/uL (ref 0.0–0.7)
Eosinophils Relative: 5 % (ref 0–5)
HCT: 39.6 % (ref 36.0–46.0)
Lymphocytes Relative: 37 % (ref 12–46)
Lymphs Abs: 2.4 10*3/uL (ref 0.7–4.0)
MCH: 27.9 pg (ref 26.0–34.0)
MCV: 84.4 fL (ref 78.0–100.0)
Monocytes Absolute: 0.6 10*3/uL (ref 0.1–1.0)
Monocytes Relative: 9 % (ref 3–12)

## 2013-06-09 LAB — URINALYSIS, ROUTINE W REFLEX MICROSCOPIC
Bilirubin Urine: NEGATIVE
Ketones, ur: NEGATIVE mg/dL
Nitrite: NEGATIVE
Specific Gravity, Urine: 1.019 (ref 1.005–1.030)
Urobilinogen, UA: 0.2 mg/dL (ref 0.0–1.0)

## 2013-06-09 LAB — TROPONIN I: Troponin I: <0.3 ng/mL (ref <0.30)

## 2013-06-09 LAB — POCT PREGNANCY, URINE: Preg Test, Ur: NEGATIVE

## 2013-06-09 LAB — COMPREHENSIVE METABOLIC PANEL
ALT: 14 U/L (ref 0–35)
BUN: 9 mg/dL (ref 6–23)
CO2: 26 mEq/L (ref 19–32)
Calcium: 9.3 mg/dL (ref 8.4–10.5)
Creatinine, Ser: 0.62 mg/dL (ref 0.50–1.10)
GFR calc Af Amer: >90 mL/min (ref >90)
GFR calc non Af Amer: >90 mL/min (ref >90)
Glucose, Bld: 96 mg/dL (ref 70–99)
Sodium: 138 mEq/L (ref 135–145)

## 2013-06-09 LAB — URINE MICROSCOPIC-ADD ON

## 2013-06-09 MED ORDER — KETOROLAC TROMETHAMINE 30 MG/ML IJ SOLN
30.0000 mg | Freq: Once | INTRAMUSCULAR | Status: AC
  Administered 2013-06-09: 30 mg via INTRAVENOUS
  Filled 2013-06-09: qty 1

## 2013-06-09 MED ORDER — HYDROCODONE-ACETAMINOPHEN 5-325 MG PO TABS: 1.0000 | ORAL_TABLET | Freq: Four times a day (QID) | ORAL | Status: DC | PRN

## 2013-06-09 NOTE — Progress Notes (Signed)
Patient confirms her pcp is Dr. Jillyn Hidden.

## 2013-06-09 NOTE — ED Provider Notes (Signed)
CSN: [id number]     Arrival date & time 06/09/13  1724 History     First MD Initiated Contact with Patient 06/09/13 1752     Chief Complaint  Patient presents with  . Chronic Renal Failure  . Back Pain  . Foot Swelling   (Consider location/radiation/quality/duration/timing/severity/associated sxs/prior Treatment) HPI Comments: Patient with a history of frequent UTI's and Pyelonephritis presents today with a chief complaint of right flank pain.  She reports that the pain has been present for the past couple of months.  She reports that the pain is constant.  Nothing in particular makes the pain worse.  She has been taking Tylenol and Motrin for the pain, which she reports is not helping.  She denies any urinary symptoms.  Denies fever or chills.  Denies vaginal bleeding or vaginal discharge.  Denies abdominal pain.  She reports that her LMP was approximately 2 weeks ago.  She is also complaining of swelling of both of her feet, which has been occurring for the past month.  She has not tried any treatment prior to arrival.  Swelling worse when she is on her feet for prolonged period of time.  She denies any lower extremity pain or erythema.  No prolonged travel or surgeries in the past 4 weeks.  No history of PE or DVT.  She does not have any history of Chronic Renal Failure as stated in the chief complaint.  She reports that at one time she was told that her heart was enlarged on a chest xray, but has never been formally diagnosed with CHF.   Past Medical History  Diagnosis Date  . Panic attacks   . Scoliosis   . Mitral valve prolapse   . Asthma     As a child  . Renal disorder   . CHF (congestive heart failure)   . UTI (lower urinary tract infection)   . Hypertension    Past Surgical History  Procedure Laterality Date  . Tubal ligation    . Laparoscopy    . Cesarean section    . Cholecystectomy    . Knee surgery    . Back surgery     Family History  Problem Relation Age of Onset   . Hypertension Mother   . Diabetes Mother   . Hypertension Father   . Diabetes Father    History  Substance Use Topics  . Smoking status: Never Smoker   . Smokeless tobacco: Never Used  . Alcohol Use: No   OB History   Grav Para Term Preterm Abortions TAB SAB Ect Mult Living                 Review of Systems  All other systems reviewed and are negative.    Allergies  Amlodipine besylate and Sulfonamide derivatives  Home Medications   Current Outpatient Rx  Name  Route  Sig  Dispense  Refill  . acetaminophen (TYLENOL) 500 MG tablet   Oral   Take 1,000 mg by mouth every 6 (six) hours as needed for pain (pain).         Marland Kitchen FLUoxetine (PROZAC) 20 MG capsule   Oral   Take 20 mg by mouth daily.          Marland Kitchen ibuprofen (ADVIL,MOTRIN) 200 MG tablet   Oral   Take 400 mg by mouth every 6 (six) hours as needed.         . nitroGLYCERIN (NITROSTAT) 0.4 MG SL tablet   Sublingual  Place 0.4 mg under the tongue as directed.            BP 141/84  Pulse 74  Temp(Src) 99.6 F (37.6 C) (Oral)  Resp 18  Ht 5\' 4"  (1.626 m)  Wt 207 lb (93.895 kg)  BMI 35.51 kg/m2  SpO2 99% Physical Exam  Nursing note and vitals reviewed. Constitutional: She appears well-developed and well-nourished.  HENT:  Head: Normocephalic and atraumatic.  Neck: Normal range of motion. Neck supple.  Cardiovascular: Normal rate, regular rhythm and normal heart sounds.   Pulmonary/Chest: Effort normal and breath sounds normal.  Abdominal: Soft. Bowel sounds are normal. She exhibits no distension and no mass. There is no tenderness. There is CVA tenderness. There is no rebound and no guarding.  Right CVA tenderness  Musculoskeletal:  No peripheral edema on exam.  Neurological: She is alert.  Skin: Skin is warm and dry.  Psychiatric: She has a normal mood and affect.    ED Course   Procedures (including critical care time)  Labs Reviewed  TROPONIN I  CBC WITH DIFFERENTIAL  COMPREHENSIVE  METABOLIC PANEL  PRO B NATRIURETIC PEPTIDE  URINALYSIS, ROUTINE W REFLEX MICROSCOPIC   Dg Chest 2 View  06/09/2013   *RADIOLOGY REPORT*  Clinical Data: Chronic renal failure, back pain, extremity swelling  CHEST - 2 VIEW  Comparison: None.  Findings: Normal heart size and vascularity.  Lungs clear.  No focal pneumonia, collapse, or consolidation.  No effusion or pneumothorax.  Trachea midline.  Upper thoracic scoliosis evident.  IMPRESSION: No acute chest process.  Thoracic scoliosis.   Original Report Authenticated By: Judie Petit. Miles Costain, M.D.   No diagnosis found.  MDM  Patient presenting with right flank pain that has been present for the past 2 months.  UA and labs today unremarkable.  Patient instructed to follow up with her PCP for the chronic pain.  Patient also complaining of peripheral edema.  No edema visualized on exam.  She states that the swelling is worse after being on her feet for an extended period of time.  Suspect that the edema is dependent edema.  Patient instructed to elevate feet and wear compression stockings.  Creatine WNL.  BNP WNL.  CXR normal.. Feel that the patient is stable for discharge.  Return precautions given.  Pascal Lux [place], PA-C 06/11/13 [phone]

## 2013-06-09 NOTE — ED Notes (Signed)
Renal problems in the past.  Pain to the rt flank area x 2 weeks- bil ankle swelling x 2 weeks.  Denies N/V/ fever denies stomach pain.

## 2013-06-11 LAB — URINE CULTURE

## 2013-06-12 NOTE — ED Provider Notes (Signed)
Medical screening examination/treatment/procedure(s) were performed by non-physician practitioner and as supervising physician I was immediately available for consultation/collaboration.   Richardean Canal, MD 06/12/13 1501

## 2013-06-12 NOTE — ED Notes (Signed)
No treatment indicated per [name] [facility]

## 2013-06-12 NOTE — Progress Notes (Signed)
ED Antimicrobial Stewardship Positive Culture Follow Up   [name] is an 37 y.o. female who presented to [facility] on 06/09/2013 with a chief complaint of  Chief Complaint  Patient presents with  . Chronic Renal Failure  . Back Pain  . Foot Swelling    Recent Results (from the past 720 hour(s))  URINE CULTURE     Status: None   Collection Time    06/09/13  6:53 PM      Result Value Range Status   Specimen Description URINE, CLEAN CATCH   Final   Special Requests NONE   Final   Culture  Setup Time     Final   Value: 06/10/2013 04:07     Performed at [facility] Count     Final   Value: >=100,000 COLONIES/ML     Performed at [facility]   Culture     Final   Value: STAPHYLOCOCCUS SPECIES (COAGULASE NEGATIVE)     Note: RIFAMPIN AND GENTAMICIN SHOULD NOT BE USED AS SINGLE DRUGS FOR TREATMENT OF STAPH INFECTIONS.     Performed at [facility]   Report Status 06/11/2013 FINAL   Final   Organism ID, Bacteria STAPHYLOCOCCUS SPECIES (COAGULASE NEGATIVE)   Final    No antibiotic prescribed at discharge. Patient with asymptomatic bacteruria - no treatment indicated at this time.   ED Provider: Doran Durand, PA-C   [name] 06/12/2013, 10:26 AM Infectious Diseases Pharmacist Phone# [phone]

## 2013-06-30 MED ORDER — SUCCINYLCHOLINE CHLORIDE 20 MG/ML IJ SOLN
INTRAMUSCULAR | Status: AC
  Filled 2013-06-30: qty 5

## 2013-06-30 MED ORDER — ETOMIDATE 2 MG/ML IV SOLN
INTRAVENOUS | Status: AC
  Filled 2013-06-30: qty 20

## 2013-06-30 MED ORDER — LIDOCAINE HCL (CARDIAC) 20 MG/ML IV SOLN
INTRAVENOUS | Status: AC
  Filled 2013-06-30: qty 5

## 2013-06-30 MED ORDER — ROCURONIUM BROMIDE 50 MG/5ML IV SOLN
INTRAVENOUS | Status: AC
  Filled 2013-06-30: qty 2

## 2013-07-29 ENCOUNTER — Encounter (HOSPITAL_COMMUNITY): Payer: Self-pay

## 2013-07-29 ENCOUNTER — Inpatient Hospital Stay (HOSPITAL_COMMUNITY)
Admission: AD | Admit: 2013-07-29 | Discharge: 2013-07-29 | Disposition: A | Discharge status: Home / Self Care | Source: Ambulatory Visit | Attending: Obstetrics and Gynecology | Admitting: Obstetrics and Gynecology

## 2013-07-29 DIAGNOSIS — N390 Urinary tract infection, site not specified: Secondary | ICD-10-CM | POA: Insufficient documentation

## 2013-07-29 DIAGNOSIS — N3000 Acute cystitis without hematuria: Secondary | ICD-10-CM

## 2013-07-29 DIAGNOSIS — R109 Unspecified abdominal pain: Secondary | ICD-10-CM | POA: Insufficient documentation

## 2013-07-29 DIAGNOSIS — M545 Low back pain, unspecified: Secondary | ICD-10-CM | POA: Insufficient documentation

## 2013-07-29 HISTORY — DX: Unspecified ovarian cyst, unspecified side: N83.209

## 2013-07-29 LAB — CBC WITH DIFFERENTIAL/PLATELET
Basophils Absolute: 0 10*3/uL (ref 0.0–0.1)
Basophils Relative: 0 % (ref 0–1)
Eosinophils Absolute: 0.2 10*3/uL (ref 0.0–0.7)
Hemoglobin: 12 g/dL (ref 12.0–15.0)
MCH: 27.3 pg (ref 26.0–34.0)
MCHC: 33.1 g/dL (ref 30.0–36.0)
MCV: 82.5 fL (ref 78.0–100.0)
Monocytes Absolute: 0.6 10*3/uL (ref 0.1–1.0)
Monocytes Relative: 9 % (ref 3–12)
Neutrophils Relative %: 62 % (ref 43–77)
RDW: 14.1 % (ref 11.5–15.5)

## 2013-07-29 LAB — URINE MICROSCOPIC-ADD ON

## 2013-07-29 LAB — WET PREP, GENITAL: Trich, Wet Prep: NONE SEEN

## 2013-07-29 LAB — URINALYSIS, ROUTINE W REFLEX MICROSCOPIC
Bilirubin Urine: NEGATIVE
Glucose, UA: NEGATIVE mg/dL
Ketones, ur: NEGATIVE mg/dL
pH: 7.5 (ref 5.0–8.0)

## 2013-07-29 MED ORDER — NITROFURANTOIN MONOHYD MACRO 100 MG PO CAPS: 100.0000 mg | ORAL_CAPSULE | Freq: Two times a day (BID) | ORAL | Status: DC

## 2013-07-29 MED ORDER — PHENAZOPYRIDINE HCL 100 MG PO TABS: 100.0000 mg | ORAL_TABLET | Freq: Three times a day (TID) | ORAL | Status: DC | PRN

## 2013-07-29 NOTE — MAU Note (Addendum)
abd pain, lower abd and "up in me", radiating around to back on right side.  Started earlier today.   Hx ov ovarian cyst and kidney infection, feels kind of like this

## 2013-07-29 NOTE — MAU Provider Note (Signed)
History     CSN: [id number]  Arrival date and time: 07/29/13 1744   None     Chief Complaint  Patient presents with  . Abdominal Pain   HPI [name] is 37 y.o. G3P0 presenting with lower abdominal pain and wraps around to her lower back.  The sxs have been there for a month, come and go and then today became worse.  There is also pain "inside of me"  Sharp.  Denies fever, chills.  + for frequency of urination, urgency, dysuria.  Denies hematuria.  Denies vaginal discharge today.  Hx of BTL.      Past Medical History  Diagnosis Date  . Panic attacks   . Scoliosis   . Mitral valve prolapse   . Asthma     As a child  . Renal disorder   . CHF (congestive heart failure)   . UTI (lower urinary tract infection)   . Hypertension   . Ovarian cyst     Past Surgical History  Procedure Laterality Date  . Tubal ligation    . Laparoscopy    . Cesarean section    . Cholecystectomy    . Knee surgery    . Back surgery      Family History  Problem Relation Age of Onset  . Hypertension Mother   . Diabetes Mother   . Hypertension Father   . Diabetes Father     History  Substance Use Topics  . Smoking status: Never Smoker   . Smokeless tobacco: Never Used  . Alcohol Use: No    Allergies:  Allergies  Allergen Reactions  . Sulfonamide Derivatives Rash    Patient was hospitalized  . Amlodipine Besylate Rash    Prescriptions prior to admission  Medication Sig Dispense Refill  . Aspirin-Acetaminophen-Caffeine (EXCEDRIN PO) Take 1 tablet by mouth daily as needed (for pain).      . nitroGLYCERIN (NITROSTAT) 0.4 MG SL tablet Place 0.4 mg under the tongue every 5 (five) minutes as needed for chest pain.      . vitamin C (ASCORBIC ACID) 500 MG tablet Take 500 mg by mouth daily.        Review of Systems  Constitutional: Negative for fever and chills.  Gastrointestinal: Positive for abdominal pain (lower abdomen radiating to lower back). Negative for nausea and vomiting.   Genitourinary: Positive for dysuria, urgency and frequency. Negative for hematuria.       Neg for vaginal discharge, bleeding   Physical Exam   Blood pressure 132/88, pulse 79, temperature 98 F (36.7 C), temperature source Oral, resp. rate 20, weight 202 lb (91.627 kg), last menstrual period 07/20/2013.  Physical Exam  Constitutional: She is oriented to person, place, and time. She appears well-developed and well-nourished. No distress.  HENT:  Head: Normocephalic.  Neck: Normal range of motion.  Cardiovascular: Normal rate.   Respiratory: Effort normal.  GI: Soft. She exhibits no distension and no mass. There is no tenderness. There is no rebound and no guarding.  Genitourinary: There is no rash, tenderness or lesion on the right labia. There is no rash, tenderness or lesion on the left labia. Uterus is tender (suprapubic tenderness). Uterus is not enlarged. Cervix exhibits no motion tenderness, no discharge and no friability. Right adnexum displays no mass, no tenderness and no fullness. Left adnexum displays no mass, no tenderness and no fullness. No erythema, tenderness or bleeding around the vagina. No vaginal discharge found.  Neurological: She is alert and oriented to  person, place, and time.  Skin: Skin is warm and dry.  Psychiatric: She has a normal mood and affect. Her behavior is normal.    Results for orders placed during the hospital encounter of 07/29/13 (from the past 24 hour(s))  URINALYSIS, ROUTINE W REFLEX MICROSCOPIC     Status: Abnormal   Collection Time    07/29/13  5:50 PM      Result Value Range   Color, Urine YELLOW  YELLOW   APPearance CLOUDY (*) CLEAR   Specific Gravity, Urine 1.025  1.005 - 1.030   pH 7.5  5.0 - 8.0   Glucose, UA NEGATIVE  NEGATIVE mg/dL   Hgb urine dipstick SMALL (*) NEGATIVE   Bilirubin Urine NEGATIVE  NEGATIVE   Ketones, ur NEGATIVE  NEGATIVE mg/dL   Protein, ur 30 (*) NEGATIVE mg/dL   Urobilinogen, UA 1.0  0.0 - 1.0 mg/dL    Nitrite POSITIVE (*) NEGATIVE   Leukocytes, UA SMALL (*) NEGATIVE  URINE MICROSCOPIC-ADD ON     Status: Abnormal   Collection Time    07/29/13  5:50 PM      Result Value Range   Squamous Epithelial / LPF FEW (*) RARE   WBC, UA 21-50  <3 WBC/hpf   RBC / HPF 0-2  <3 RBC/hpf   Bacteria, UA MANY (*) RARE  POCT PREGNANCY, URINE     Status: None   Collection Time    07/29/13  6:08 PM      Result Value Range   Preg Test, Ur NEGATIVE  NEGATIVE  CBC WITH DIFFERENTIAL     Status: None   Collection Time    07/29/13  6:41 PM      Result Value Range   WBC 7.3  4.0 - 10.5 K/uL   RBC 4.39  3.87 - 5.11 MIL/uL   Hemoglobin 12.0  12.0 - 15.0 g/dL   HCT 78.2  95.6 - 21.3 %   MCV 82.5  78.0 - 100.0 fL   MCH 27.3  26.0 - 34.0 pg   MCHC 33.1  30.0 - 36.0 g/dL   RDW 08.6  57.8 - 46.9 %   Platelets 382  150 - 400 K/uL   Neutrophils Relative % 62  43 - 77 %   Neutro Abs 4.6  1.7 - 7.7 K/uL   Lymphocytes Relative 26  12 - 46 %   Lymphs Abs 1.9  0.7 - 4.0 K/uL   Monocytes Relative 9  3 - 12 %   Monocytes Absolute 0.6  0.1 - 1.0 K/uL   Eosinophils Relative 3  0 - 5 %   Eosinophils Absolute 0.2  0.0 - 0.7 K/uL   Basophils Relative 0  0 - 1 %   Basophils Absolute 0.0  0.0 - 0.1 K/uL  WET PREP, GENITAL     Status: Abnormal   Collection Time    07/29/13  7:35 PM      Result Value Range   Yeast Wet Prep HPF POC NONE SEEN  NONE SEEN   Trich, Wet Prep NONE SEEN  NONE SEEN   Clue Cells Wet Prep HPF POC FEW (*) NONE SEEN   WBC, Wet Prep HPF POC FEW (*) NONE SEEN   MAU Course  Procedures  GC/CHL culture to lab  MDM  Assessment and Plan  A:  UTI  P:  Rx for  Macrobid 500mg  bid X 1 week       Increase po fluids       Instructed  to complete all of the medication and that we could call if culture comes back that medication needs to be changed         KEY,EVE M 07/29/2013, 8:14 PM

## 2013-07-30 LAB — GC/CHLAMYDIA PROBE AMP
CT Probe RNA: NEGATIVE
GC Probe RNA: NEGATIVE

## 2013-07-31 LAB — URINE CULTURE

## 2013-08-02 NOTE — MAU Provider Note (Signed)
Attestation of Attending Supervision of Advanced Practitioner: Evaluation and management procedures were performed by the PA/NP/CNM/OB Fellow under my supervision/collaboration. Chart reviewed and agree with management and plan.  Tilda Burrow 08/02/2013 6:37 PM

## 2013-09-16 ENCOUNTER — Ambulatory Visit: Payer: Self-pay | Admitting: Obstetrics

## 2013-10-05 ENCOUNTER — Encounter (HOSPITAL_COMMUNITY): Payer: Self-pay | Admitting: Emergency Medicine

## 2013-10-05 ENCOUNTER — Emergency Department (HOSPITAL_COMMUNITY)
Admission: EM | Admit: 2013-10-05 | Discharge: 2013-10-05 | Disposition: A | Discharge status: Home / Self Care | Attending: Emergency Medicine | Admitting: Emergency Medicine

## 2013-10-05 ENCOUNTER — Emergency Department (HOSPITAL_COMMUNITY)

## 2013-10-05 DIAGNOSIS — I10 Essential (primary) hypertension: Secondary | ICD-10-CM | POA: Insufficient documentation

## 2013-10-05 DIAGNOSIS — J45909 Unspecified asthma, uncomplicated: Secondary | ICD-10-CM | POA: Insufficient documentation

## 2013-10-05 DIAGNOSIS — Z8659 Personal history of other mental and behavioral disorders: Secondary | ICD-10-CM | POA: Insufficient documentation

## 2013-10-05 DIAGNOSIS — R51 Headache: Secondary | ICD-10-CM | POA: Insufficient documentation

## 2013-10-05 DIAGNOSIS — R5381 Other malaise: Secondary | ICD-10-CM | POA: Insufficient documentation

## 2013-10-05 DIAGNOSIS — R42 Dizziness and giddiness: Secondary | ICD-10-CM | POA: Insufficient documentation

## 2013-10-05 DIAGNOSIS — Z8739 Personal history of other diseases of the musculoskeletal system and connective tissue: Secondary | ICD-10-CM | POA: Insufficient documentation

## 2013-10-05 DIAGNOSIS — Z8742 Personal history of other diseases of the female genital tract: Secondary | ICD-10-CM | POA: Insufficient documentation

## 2013-10-05 DIAGNOSIS — Z87440 Personal history of urinary (tract) infections: Secondary | ICD-10-CM | POA: Insufficient documentation

## 2013-10-05 DIAGNOSIS — Z87448 Personal history of other diseases of urinary system: Secondary | ICD-10-CM | POA: Insufficient documentation

## 2013-10-05 DIAGNOSIS — Z3202 Encounter for pregnancy test, result negative: Secondary | ICD-10-CM | POA: Insufficient documentation

## 2013-10-05 DIAGNOSIS — I509 Heart failure, unspecified: Secondary | ICD-10-CM | POA: Insufficient documentation

## 2013-10-05 LAB — URINE MICROSCOPIC-ADD ON

## 2013-10-05 LAB — CBC WITH DIFFERENTIAL/PLATELET
Basophils Absolute: 0 10*3/uL (ref 0.0–0.1)
Basophils Relative: 0 % (ref 0–1)
Eosinophils Relative: 4 % (ref 0–5)
HCT: 35.4 % — ABNORMAL LOW (ref 36.0–46.0)
Hemoglobin: 11.6 g/dL — ABNORMAL LOW (ref 12.0–15.0)
Lymphs Abs: 1.7 10*3/uL (ref 0.7–4.0)
MCHC: 32.8 g/dL (ref 30.0–36.0)
MCV: 83.7 fL (ref 78.0–100.0)
Monocytes Absolute: 0.6 10*3/uL (ref 0.1–1.0)
Monocytes Relative: 9 % (ref 3–12)
Neutro Abs: 3.8 10*3/uL (ref 1.7–7.7)
Platelets: 380 10*3/uL (ref 150–400)
RBC: 4.23 MIL/uL (ref 3.87–5.11)
WBC: 6.4 10*3/uL (ref 4.0–10.5)

## 2013-10-05 LAB — URINALYSIS, ROUTINE W REFLEX MICROSCOPIC
Glucose, UA: NEGATIVE mg/dL
Hgb urine dipstick: NEGATIVE
Ketones, ur: NEGATIVE mg/dL
Nitrite: NEGATIVE
pH: 8 (ref 5.0–8.0)

## 2013-10-05 LAB — BASIC METABOLIC PANEL
CO2: 25 mEq/L (ref 19–32)
Chloride: 99 mEq/L (ref 96–112)
GFR calc non Af Amer: >90 mL/min (ref >90)
Potassium: 4.2 mEq/L (ref 3.5–5.1)
Sodium: 134 mEq/L — ABNORMAL LOW (ref 135–145)

## 2013-10-05 MED ORDER — MECLIZINE HCL 25 MG PO TABS: 25.0000 mg | ORAL_TABLET | Freq: Three times a day (TID) | ORAL | Status: DC | PRN

## 2013-10-05 MED ORDER — GADOBENATE DIMEGLUMINE 529 MG/ML IV SOLN
20.0000 mL | Freq: Once | INTRAVENOUS | Status: AC | PRN
  Administered 2013-10-05: 20 mL via INTRAVENOUS

## 2013-10-05 MED ORDER — ACETAMINOPHEN 500 MG PO TABS
1000.0000 mg | ORAL_TABLET | Freq: Once | ORAL | Status: AC
  Administered 2013-10-05: 1000 mg via ORAL
  Filled 2013-10-05: qty 2

## 2013-10-05 MED ORDER — MECLIZINE HCL 25 MG PO TABS
25.0000 mg | ORAL_TABLET | Freq: Once | ORAL | Status: AC
  Administered 2013-10-05: 25 mg via ORAL
  Filled 2013-10-05: qty 1

## 2013-10-05 NOTE — ED Provider Notes (Signed)
CSN: [id number]     Arrival date & time 10/05/13  1126 History   First MD Initiated Contact with Patient 10/05/13 1258     Chief Complaint  Patient presents with  . Dizziness  . Weakness   (Consider location/radiation/quality/duration/timing/severity/associated sxs/prior Treatment) HPI.... dizziness since yesterday with associated sharp left parietal headache. Feels lightheaded.  No gross neurological deficits, fever, stiff neck. Last menstrual period approximately one month ago. Rx ibuprofen with minimal relief. Severity is mild to moderate. Patient is ambulatory  Past Medical History  Diagnosis Date  . Panic attacks   . Scoliosis   . Mitral valve prolapse   . Asthma     As a child  . Renal disorder   . CHF (congestive heart failure)   . UTI (lower urinary tract infection)   . Hypertension   . Ovarian cyst    Past Surgical History  Procedure Laterality Date  . Tubal ligation    . Laparoscopy    . Cesarean section    . Cholecystectomy    . Knee surgery    . Back surgery     Family History  Problem Relation Age of Onset  . Hypertension Mother   . Diabetes Mother   . Hypertension Father   . Diabetes Father    History  Substance Use Topics  . Smoking status: Never Smoker   . Smokeless tobacco: Never Used  . Alcohol Use: No   OB History   Grav Para Term Preterm Abortions TAB SAB Ect Mult Living   3         3     Review of Systems  All other systems reviewed and are negative.    Allergies  Sulfonamide derivatives and Amlodipine besylate  Home Medications   Current Outpatient Rx  Name  Route  Sig  Dispense  Refill  . acetaminophen (TYLENOL) 500 MG tablet   Oral   Take 1,000 mg by mouth every 6 (six) hours as needed.         Marland Kitchen ibuprofen (ADVIL,MOTRIN) 200 MG tablet   Oral   Take 400 mg by mouth every 6 (six) hours as needed for headache.         . nitroGLYCERIN (NITROSTAT) 0.4 MG SL tablet   Sublingual   Place 0.4 mg under the tongue every 5  (five) minutes as needed for chest pain.          BP 127/80  Pulse 82  Temp(Src) 99.6 F (37.6 C) (Oral)  SpO2 100% Physical Exam  Nursing note and vitals reviewed. Constitutional: She is oriented to person, place, and time. She appears well-developed and well-nourished.  HENT:  Head: Normocephalic and atraumatic.  Eyes: Conjunctivae and EOM are normal. Pupils are equal, round, and reactive to light.  Neck: Normal range of motion. Neck supple.  Cardiovascular: Normal rate, regular rhythm and normal heart sounds.   Pulmonary/Chest: Effort normal and breath sounds normal.  Abdominal: Soft. Bowel sounds are normal.  Musculoskeletal: Normal range of motion.  Neurological: She is alert and oriented to person, place, and time.  Skin: Skin is warm and dry.  Psychiatric: She has a normal mood and affect. Her behavior is normal.    ED Course  Procedures (including critical care time) Labs Review Labs Reviewed  BASIC METABOLIC PANEL - Abnormal; Notable for the following:    Sodium 134 (*)    All other components within normal limits  CBC WITH DIFFERENTIAL - Abnormal; Notable for the following:  Hemoglobin 11.6 (*)    HCT 35.4 (*)    All other components within normal limits  URINALYSIS, ROUTINE W REFLEX MICROSCOPIC  PREGNANCY, URINE   Imaging Review No results found.  EKG Interpretation    Date/Time:  Monday October 05 2013 11:44:12 EST Ventricular Rate:  81 PR Interval:  151 QRS Duration: 98 QT Interval:  383 QTC Calculation: 445 R Axis:   54 Text Interpretation:  Sinus rhythm No significant change since last tracing Confirmed by STEINL  MD, KEVIN ([id number]) on 10/05/2013 11:47:04 AM            MDM  No diagnosis found.  No gross neuro deficits.  Pt is slightly anemic.   Chemistry panel was normal.   CT head pending.  Discussed with Dr Wilkie Aye.  Can d/c home if  head CT normal.     Donnetta Hutching, MD 10/05/13 [phone]

## 2013-10-05 NOTE — ED Provider Notes (Signed)
I received this patient in signout from Dr. Adriana Simas. CT scan is pending. CT this concern for small punctate recent infarct in the parietal lobe. I reexamined the patient. She reports episodes of vertigo since Sunday as well as left upper extremity weakness and numbness. Objectively, she is no evidence of cerebellar dysfunction and finger-nose-finger is intact. has a normal neurologic exam. I discussed the patient with Dr. Amada Jupiter.  Will get an MRI with and without contrast.  MRI is negative. Patient will be discharged home. She will be given neurology followup as some of her symptoms may be related to complicated migraine.  After history, exam, and medical workup I feel the patient has been appropriately medically screened and is safe for discharge home. Pertinent diagnoses were discussed with the patient. Patient was given return precautions.  Results for orders placed during the hospital encounter of 10/05/13  BASIC METABOLIC PANEL      Result Value Range   Sodium 134 (*) 135 - 145 mEq/L   Potassium 4.2  3.5 - 5.1 mEq/L   Chloride 99  96 - 112 mEq/L   CO2 25  19 - 32 mEq/L   Glucose, Bld 82  70 - 99 mg/dL   BUN 11  6 - 23 mg/dL   Creatinine, Ser 4.54  0.50 - 1.10 mg/dL   Calcium 9.2  8.4 - 09.8 mg/dL   GFR calc non Af Amer >90  >90 mL/min   GFR calc Af Amer >90  >90 mL/min  CBC WITH DIFFERENTIAL      Result Value Range   WBC 6.4  4.0 - 10.5 K/uL   RBC 4.23  3.87 - 5.11 MIL/uL   Hemoglobin 11.6 (*) 12.0 - 15.0 g/dL   HCT 11.9 (*) 14.7 - 82.9 %   MCV 83.7  78.0 - 100.0 fL   MCH 27.4  26.0 - 34.0 pg   MCHC 32.8  30.0 - 36.0 g/dL   RDW 56.2  13.0 - 86.5 %   Platelets 380  150 - 400 K/uL   Neutrophils Relative % 60  43 - 77 %   Lymphocytes Relative 27  12 - 46 %   Monocytes Relative 9  3 - 12 %   Eosinophils Relative 4  0 - 5 %   Basophils Relative 0  0 - 1 %   Neutro Abs 3.8  1.7 - 7.7 K/uL   Lymphs Abs 1.7  0.7 - 4.0 K/uL   Monocytes Absolute 0.6  0.1 - 1.0 K/uL   Eosinophils  Absolute 0.3  0.0 - 0.7 K/uL   Basophils Absolute 0.0  0.0 - 0.1 K/uL  URINALYSIS, ROUTINE W REFLEX MICROSCOPIC      Result Value Range   Color, Urine YELLOW  YELLOW   APPearance TURBID (*) CLEAR   Specific Gravity, Urine 1.015  1.005 - 1.030   pH 8.0  5.0 - 8.0   Glucose, UA NEGATIVE  NEGATIVE mg/dL   Hgb urine dipstick NEGATIVE  NEGATIVE   Bilirubin Urine NEGATIVE  NEGATIVE   Ketones, ur NEGATIVE  NEGATIVE mg/dL   Protein, ur NEGATIVE  NEGATIVE mg/dL   Urobilinogen, UA 0.2  0.0 - 1.0 mg/dL   Nitrite NEGATIVE  NEGATIVE   Leukocytes, UA SMALL (*) NEGATIVE  PREGNANCY, URINE      Result Value Range   Preg Test, Ur NEGATIVE  NEGATIVE  URINE MICROSCOPIC-ADD ON      Result Value Range   Squamous Epithelial / LPF FEW (*) RARE  WBC, UA 3-6  <3 WBC/hpf   Bacteria, UA RARE  RARE   Urine-Other AMORPHOUS URATES/PHOSPHATES     Ct Head Wo Contrast  10/05/2013   CLINICAL DATA:  Dizziness with intermittent left-sided headaches  EXAM: CT HEAD WITHOUT CONTRAST  TECHNIQUE: Contiguous axial images were obtained from the base of the skull through the vertex without intravenous contrast. Study was obtained within 24 hr of patient's arrival at the emergency department.  COMPARISON:  Brain CT June 16, 2007  FINDINGS: The ventricles are normal in size and configuration. There is a cavum septum pellucidum, an anatomic variant.  There is no mass, hemorrhage, extra-axial fluid collection, or midline shift.  There is a small focal area of decreased attenuation at the gray - white junction of the medial right parietal lobe at the level of the falx measuring 1.3 x 1.1 cm. This finding is concerning for a small recent infarct. Elsewhere, gray-white compartments are normal.  Bony calvarium appears intact.  The mastoid air cells are clear.  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 Study otherwise unremarkable. A cavum septum pellucidum is present, an anatomic variant.    Electronically Signed   By: Bretta Bang M.D.   On: 10/05/2013 16:38   Mr Laqueta Jean AV Contrast  10/05/2013   CLINICAL DATA:  Dizziness with weakness and left-sided headaches.  EXAM: MRI HEAD WITHOUT AND WITH CONTRAST  TECHNIQUE: Multiplanar, multiecho pulse sequences of the brain and surrounding structures were obtained without and with intravenous contrast.  CONTRAST:  20mL MULTIHANCE GADOBENATE DIMEGLUMINE 529 MG/ML IV SOLN  COMPARISON:  CT head earlier in the day. Multiple prior CT head scans dating to 12/12/2005.  FINDINGS: No evidence for acute infarction, hemorrhage, mass lesion, hydrocephalus, or extra-axial fluid. There is no atrophy or white matter disease.  There is a disordered appearance to the superior vermis with slight right to left displacement on a chronic basis. 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 This is more difficult to appreciate on CT, but a similar appearance could be seen as far back on CT head scans dating to 12/12/2005. The cavum septum pellucidum and cavum vergae appear stable compared with priors. It is possible that the cavum vergae contributes to the mild mass effect by the superior vermian cistern arachnoid cyst.  Otherwise unremarkable pituitary, frontal, and cerebellar tonsils. Flow voids are maintained throughout the carotid, basilar, and vertebral arteries. There are no areas of chronic hemorrhage.  Post infusion, no abnormal enhancement of the brain or meninges. Visualized calvarium, skull base, and upper cervical osseous structures unremarkable. Scalp and extracranial soft tissues, orbits, sinuses, and mastoids show no acute process.  IMPRESSION: No acute or focal intracranial abnormality. The area of questioned hypoattenuation on CT appears to represent normal white matter.  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   Electronically Signed   By: Davonna Belling M.D.   On: 10/05/2013 19:48      Shon Baton, MD 10/05/13 2008

## 2013-10-05 NOTE — ED Notes (Signed)
Pt reports intermittent dizzy spells for the last few weeks. With intermittent left sided headaches. Pt hx of scoliosis, mitral valve prolapse, and CHF. Pt reports for the last few days she has felt "really drained and weak".  Started to get a fever yesterday. Pt reports chronic back pain 10/10 from scoliosis. Pt has tried ibuprofen and tylenol with no relief.

## 2013-11-04 ENCOUNTER — Ambulatory Visit: Payer: Self-pay | Admitting: Neurology

## 2013-11-05 ENCOUNTER — Encounter: Payer: Self-pay | Admitting: Neurology

## 2013-12-16 ENCOUNTER — Ambulatory Visit (INDEPENDENT_AMBULATORY_CARE_PROVIDER_SITE_OTHER): Admitting: Neurology

## 2013-12-16 ENCOUNTER — Encounter: Payer: Self-pay | Admitting: Neurology

## 2013-12-16 VITALS — BP 140/92 | HR 80 | Temp 98.0°F | Resp 18 | Ht 64.0 in | Wt 202.5 lb

## 2013-12-16 DIAGNOSIS — H8110 Benign paroxysmal vertigo, unspecified ear: Secondary | ICD-10-CM

## 2013-12-16 DIAGNOSIS — G4440 Drug-induced headache, not elsewhere classified, not intractable: Secondary | ICD-10-CM

## 2013-12-16 NOTE — Patient Instructions (Signed)
I think the dizzy spells are due to an inner ear dysfunction.  The headaches are related to overuse of pain relievers and may be triggered by dizzy spells.  1.  I recommend referral to vestibular rehab, to receive physical therapy and exercises to help your equilibrium. 2.  I would like you to refrain from taking pain medications no more than once or twice a week, because overuse of pain medications contribute to daily headaches.  Do not use hydrocodone to treat the headache. 3.  Stay hydrated. 4.  Follow up in a month.  If the vestibular rehab is not effective, then I would treat with medications used for chronic headache, in case if the dizziness is in fact a component of migraine.  Typical medications used for chronic migraine include certain blood pressure medications, certain seizure medications and certain antidepressants.

## 2013-12-16 NOTE — Progress Notes (Signed)
NEUROLOGY CONSULTATION NOTE  CREE RAMCHANDANI MRN: [id number] DOB: 03-29-1976  Referring provider: Thayer Jew, MD (ED referral) Primary care provider: Antonietta Jewel, MD  Reason for consult:  Headache and dizziness  HISTORY OF PRESENT ILLNESS: [name] is a 38 year old right-handed woman with history of scoliosis, mitral valve prolapse, CHF, anxiety, panic attacks and migraine who presents for headache and dizziness.  Records and images were personally reviewed where available.    Symptoms started in December.  She presented to the ED on 10/05/13.  She had developed sudden onset persistent spinning sensation.  Her exam was unremarkable.  She underwent a CT of the head which revealed a small focal area of decreased attenuation in the medial right parietal lobe at the gray-white junction, suspicious for a small recent infarct.  She then had an MRI of the brain with and without contrast, which was unremarkable and then believed that the finding on CT was normal white matter.  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  No fever or evidence of infection.  Since that time, she has had episodic vertigo and headache.  The vertigo is often triggered with any quick change of head movement or position.  It lasts anywhere from 30 seconds to a couple of minutes.  However, in between spells, she feels a little heavy in the head, feels uncoordinated and is off-balance on her feet.  There is no associated nausea, vomiting or visual disturbance.  It is sometimes associated with headache, but not always.  She has taken meclizine, which helps with the symptoms, but it makes her sleepy.  Around the same time, she also has been suffering from chronic headache.  It is left-sided, intense pressure or sharp pain.  It is associated with photophobia but not nausea, phonophobia or osmophobia.  It is not always associated with the dizzy spells.  She has taken naproxen and hydrocodone, which aren't  really effective.  She has history of chronic pain and takes pain medications such as hydrocodone for that.  She has been taking pain medications daily.  She does have history of stress and anxiety.  Past neurological history.  In 1995, she had pre-eclampsia during her pregnancy, in which she had seizures and vision loss due to presumed PRES.  She developed similar headaches at that time.  She was previously seen at [facility] several years ago.  Her headaches were thought to be related to panic attacks and she used to take lorazepam, which helped.  Labs (10/08/13):  CBC, CMP, TSH, ANA, CRP, RF unremarkable.  PAST MEDICAL HISTORY: Past Medical History  Diagnosis Date  . Panic attacks   . Scoliosis   . Mitral valve prolapse   . Asthma     As a child  . Renal disorder   . CHF (congestive heart failure)   . UTI (lower urinary tract infection)   . Hypertension   . Ovarian cyst     PAST SURGICAL HISTORY: Past Surgical History  Procedure Laterality Date  . Tubal ligation    . Laparoscopy    . Cesarean section    . Cholecystectomy    . Knee surgery    . Back surgery      MEDICATIONS: Current Outpatient Prescriptions on File Prior to Visit  Medication Sig Dispense Refill  . acetaminophen (TYLENOL) 500 MG tablet Take 1,000 mg by mouth every 6 (six) hours as needed.      Marland Kitchen ibuprofen (ADVIL,MOTRIN) 200 MG tablet Take  400 mg by mouth every 6 (six) hours as needed for headache.      . meclizine (ANTIVERT) 25 MG tablet Take 1 tablet (25 mg total) by mouth 3 (three) times daily as needed for dizziness.  15 tablet  0  . nitroGLYCERIN (NITROSTAT) 0.4 MG SL tablet Place 0.4 mg under the tongue every 5 (five) minutes as needed for chest pain.      . [DISCONTINUED] clonazePAM (KLONOPIN) 1 MG tablet Take 1 mg by mouth 4 (four) times daily.        . [DISCONTINUED] metoprolol tartrate (LOPRESSOR) 25 MG tablet Take 25 mg by mouth 2 (two) times daily.         No current  facility-administered medications on file prior to visit.    ALLERGIES: Allergies  Allergen Reactions  . Sulfonamide Derivatives Rash    Patient was hospitalized  . Amlodipine Besylate Rash    FAMILY HISTORY: Family History  Problem Relation Age of Onset  . Hypertension Mother   . Diabetes Mother   . Stroke Mother   . Hypertension Father   . Diabetes Father   . Dementia Father     SOCIAL HISTORY: History   Social History  . Marital Status: Married    Spouse Name: N/A    Number of Children: N/A  . Years of Education: N/A   Occupational History  . Not on file.   Social History Main Topics  . Smoking status: Never Smoker   . Smokeless tobacco: Never Used  . Alcohol Use: No  . Drug Use: No  . Sexual Activity: Yes    Birth Control/ Protection: Surgical   Other Topics Concern  . Not on file   Social History Narrative   Married with 3 children in their teens    REVIEW OF SYSTEMS: Constitutional: No fevers, chills, or sweats, no generalized fatigue, change in appetite Eyes: No visual changes, double vision, eye pain Ear, nose and throat: No hearing loss, ear pain, nasal congestion, sore throat Cardiovascular: No chest pain, palpitations Respiratory:  No shortness of breath at rest or with exertion, wheezes GastrointestinaI: No nausea, vomiting, diarrhea, abdominal pain, fecal incontinence Genitourinary:  No dysuria, urinary retention or frequency Musculoskeletal:  No neck pain, back pain Integumentary: No rash, pruritus, skin lesions Neurological: as above Psychiatric: No depression, insomnia, anxiety Endocrine: No palpitations, fatigue, diaphoresis, mood swings, change in appetite, change in weight, increased thirst Hematologic/Lymphatic:  No anemia, purpura, petechiae. Allergic/Immunologic: no itchy/runny eyes, nasal congestion, recent allergic reactions, rashes  PHYSICAL EXAM: Filed Vitals:   12/16/13 1023  BP: 140/92  Pulse: 80  Temp: 98 F (36.7 C)    Resp: 18   General: No acute distress Head:  Normocephalic/atraumatic Neck: supple, no paraspinal tenderness, full range of motion Back: No paraspinal tenderness Heart: regular rate and rhythm Lungs: Clear to auscultation bilaterally. Vascular: No carotid bruits. Neurological Exam: Mental status: alert and oriented to person, place, and time, speech fluent and not dysarthric, language intact. Cranial nerves: CN I: not tested CN II: pupils equal, round and reactive to light, visual fields intact, fundi unremarkable. CN III, IV, VI:  full range of motion, no nystagmus, no ptosis CN V: facial sensation intact CN VII: upper and lower face symmetric CN VIII: hearing intact CN IX, X: gag intact, uvula midline CN XI: sternocleidomastoid and trapezius muscles intact CN XII: tongue midline Bulk & Tone: normal, no fasciculations. Motor: 5/5 throughout Sensation: temperature and vibration intact. Deep Tendon Reflexes: 2+ throughout, toes down Finger  to nose testing: no tremor or dysmetria Heel to shin: no dysmetria Gait: cautious.  Normal stride and stance.  No ataxia.  Stumbled a little when turning.  Able to walk in tandem. Romberg negative.  IMPRESSION: 1.  Vertigo.  Suspect peripheral vestibulopathy.  She feels an overall uneasy feeling but the severe vertigo is triggered by change in position and episodes are quite brief.  A vestibular component of migraine is possible as well. 2.  Chronic headache, likely related to medication-overuse, anxiety, and possibly the vertigo   PLAN: 1.  We will try vestibular rehab first 2.  If ineffective, I would see if a migraine preventative medication may be helpful. 3.  Recommended refrain from using pain relievers (at least no more than one or two days out of week), as medication overuse is contributing to persistent headache 4.  Stay hydrated. 5.  Follow up 4 weeks after initiating vestibular rehab.  45 minutes spent with patient, over 50%  spent counseling and coordinating care.  Thank you for allowing me to take part in the care of this patient.  Metta Clines, DO  CC:  Antonietta Jewel, MD

## 2013-12-30 ENCOUNTER — Ambulatory Visit: Attending: Neurology | Admitting: Physical Therapy

## 2013-12-30 ENCOUNTER — Ambulatory Visit: Admitting: Rehabilitative and Restorative Service Providers"

## 2013-12-30 DIAGNOSIS — R42 Dizziness and giddiness: Secondary | ICD-10-CM | POA: Insufficient documentation

## 2013-12-30 DIAGNOSIS — IMO0001 Reserved for inherently not codable concepts without codable children: Secondary | ICD-10-CM | POA: Insufficient documentation

## 2014-01-12 ENCOUNTER — Ambulatory Visit: Attending: Physical Therapy | Admitting: Physical Therapy

## 2014-01-12 DIAGNOSIS — R42 Dizziness and giddiness: Secondary | ICD-10-CM | POA: Insufficient documentation

## 2014-01-12 DIAGNOSIS — IMO0001 Reserved for inherently not codable concepts without codable children: Secondary | ICD-10-CM | POA: Insufficient documentation

## 2014-01-13 ENCOUNTER — Ambulatory Visit: Payer: Self-pay | Admitting: Neurology

## 2014-03-08 ENCOUNTER — Ambulatory Visit (INDEPENDENT_AMBULATORY_CARE_PROVIDER_SITE_OTHER): Admitting: Obstetrics

## 2014-03-08 ENCOUNTER — Encounter: Payer: Self-pay | Admitting: Obstetrics

## 2014-03-08 VITALS — BP 142/91 | HR 71 | Temp 98.0°F | Ht 63.5 in | Wt 201.0 lb

## 2014-03-08 DIAGNOSIS — N939 Abnormal uterine and vaginal bleeding, unspecified: Secondary | ICD-10-CM

## 2014-03-08 DIAGNOSIS — N926 Irregular menstruation, unspecified: Secondary | ICD-10-CM | POA: Insufficient documentation

## 2014-03-08 DIAGNOSIS — N949 Unspecified condition associated with female genital organs and menstrual cycle: Secondary | ICD-10-CM

## 2014-03-08 DIAGNOSIS — Z113 Encounter for screening for infections with a predominantly sexual mode of transmission: Secondary | ICD-10-CM

## 2014-03-08 DIAGNOSIS — Z01419 Encounter for gynecological examination (general) (routine) without abnormal findings: Secondary | ICD-10-CM

## 2014-03-08 NOTE — Progress Notes (Signed)
Subjective:     [name] is a 38 y.o. female here for a routine exam.  Current complaints: Lower abdominal shooting pains and heavy periods.    Personal health questionnaire:  Is patient Ashkenazi Jewish, have a family history of breast and/or ovarian cancer: no Is there a family history of uterine cancer diagnosed at age < 15, gastrointestinal cancer, urinary tract cancer, family member who is a Field seismologist syndrome-associated carrier: no Is the patient overweight and hypertensive, family history of diabetes, personal history of gestational diabetes or PCOS: no Is patient over 56, have PCOS,  family history of premature CHD under age 37, diabetes, smoke, have hypertension or peripheral artery disease:  no  The HPI was reviewed and explored in further detail by the provider. Gynecologic History Patient's last menstrual period was 03/03/2014. Contraception: tubal ligation Last Pap: Unknown. Results were: normal ( Has H/O some abnormal paps ) Last mammogram: 2015. Results were: normal  Obstetric History OB History  Gravida Para Term Preterm AB SAB TAB Ectopic Multiple Living  3 3 2 1      3     # Outcome Date GA Lbr Len/2nd Weight Sex Delivery Anes PTL Lv  3 TRM 12/26/96 [redacted]w[redacted]d  7 lb (3.175 kg) M SVD EPI  Y  2 TRM 02/22/94 [redacted]w[redacted]d  6 lb 12 oz (3.062 kg) M SVD EPI  Y  1 PRE 10/21/91 [redacted]w[redacted]d  5 lb 1 oz (2.296 kg) F LTCS EPI  Y      Past Medical History  Diagnosis Date  . Panic attacks   . Scoliosis   . Mitral valve prolapse   . Asthma     As a child  . Renal disorder   . UTI (lower urinary tract infection)   . Hypertension   . Ovarian cyst   . Migraine   . Vertigo   . Degenerative disk disease     Past Surgical History  Procedure Laterality Date  . Tubal ligation    . Laparoscopy    . Cesarean section    . Cholecystectomy    . Knee surgery    . Back surgery    . Radio frequency albiation       Current outpatient prescriptions:acetaminophen (TYLENOL) 500 MG tablet, Take 1,000  mg by mouth every 6 (six) hours as needed., Disp: , Rfl: ;  Butalbital-APAP-Caffeine 50-325-40 MG per capsule, Take by mouth., Disp: , Rfl: ;  DULoxetine (CYMBALTA) 30 MG capsule, Take by mouth., Disp: , Rfl: ;  gabapentin (NEURONTIN) 300 MG capsule, Take 300 mg by mouth 3 (three) times daily., Disp: , Rfl:  HYDROcodone-acetaminophen (NORCO/VICODIN) 5-325 MG per tablet, Take 1 tablet by mouth every 6 (six) hours as needed for moderate pain., Disp: , Rfl: ;  ibuprofen (ADVIL,MOTRIN) 200 MG tablet, Take 400 mg by mouth every 6 (six) hours as needed for headache., Disp: , Rfl: ;  meclizine (ANTIVERT) 25 MG tablet, Take 1 tablet (25 mg total) by mouth 3 (three) times daily as needed for dizziness., Disp: 15 tablet, Rfl: 0 naproxen (NAPROSYN) 500 MG tablet, Take 500 mg by mouth 2 (two) times daily with a meal., Disp: , Rfl: ;  nitroGLYCERIN (NITROSTAT) 0.4 MG SL tablet, Place 0.4 mg under the tongue every 5 (five) minutes as needed for chest pain., Disp: , Rfl: ;  [DISCONTINUED] clonazePAM (KLONOPIN) 1 MG tablet, Take 1 mg by mouth 4 (four) times daily.  , Disp: , Rfl:  [DISCONTINUED] metoprolol tartrate (LOPRESSOR) 25 MG tablet, Take 25  mg by mouth 2 (two) times daily.  , Disp: , Rfl:  Allergies  Allergen Reactions  . Sulfonamide Derivatives Rash    Patient was hospitalized  . Norvasc [Amlodipine] Swelling  . Amlodipine Besylate Rash    History  Substance Use Topics  . Smoking status: Former Research scientist (life sciences)  . Smokeless tobacco: Never Used  . Alcohol Use: No    Family History  Problem Relation Age of Onset  . Hypertension Mother   . Diabetes Mother   . Stroke Mother   . Heart disease Mother   . Hypertension Father   . Diabetes Father   . Dementia Father       Review of Systems  Constitutional: negative for fatigue and weight loss Respiratory: negative for cough and wheezing Cardiovascular: negative for chest pain, fatigue and palpitations Gastrointestinal: negative for abdominal pain and change  in bowel habits Musculoskeletal:negative for myalgias Neurological: negative for gait problems and tremors Behavioral/Psych: negative for abusive relationship, depression Endocrine: negative for temperature intolerance   Genitourinary:negative for abnormal menstrual periods, genital lesions, hot flashes, sexual problems and vaginal discharge Integument/breast: negative for breast lump, breast tenderness, nipple discharge and skin lesion(s)    Objective:       General:   alert  Skin:   no rash or abnormalities  Lungs:   clear to auscultation bilaterally  Heart:   regular rate and rhythm, S1, S2 normal, no murmur, click, rub or gallop  Breasts:   normal without suspicious masses, skin or nipple changes or axillary nodes  Abdomen:  normal findings: no organomegaly, soft, non-tender and no hernia  Pelvis:  External genitalia: normal general appearance Urinary system: urethral meatus normal and bladder without fullness, nontender Vaginal: normal without tenderness, induration or masses Cervix: normal appearance Adnexa: normal bimanual exam Uterus: anteverted and non-tender, normal size   Lab Review Urine pregnancy test Labs reviewed yes Radiologic studies reviewed no   Assessment:    Healthy female exam.   H/O shooting lower abdominal pain.  H/O AUB.  Heavy periods.   Plan:    Education reviewed: low fat, low cholesterol diet, safe sex/STD prevention, self breast exams and weight bearing exercise. Contraception: tubal ligation. Follow up in: 2 weeks. Ultrasound ordered.   Meds ordered this encounter  Medications  . gabapentin (NEURONTIN) 300 MG capsule    Sig: Take 300 mg by mouth 3 (three) times daily.  Marland Kitchen DISCONTD: BUTALBITAL-ACETAMINOPHEN PO    Sig: Take by mouth.  . Butalbital-APAP-Caffeine 50-325-40 MG per capsule    Sig: Take by mouth.  . DULoxetine (CYMBALTA) 30 MG capsule    Sig: Take by mouth.  . naproxen (NAPROSYN) 500 MG tablet    Sig: Take 500 mg by mouth  2 (two) times daily with a meal.  . HYDROcodone-acetaminophen (NORCO/VICODIN) 5-325 MG per tablet    Sig: Take 1 tablet by mouth every 6 (six) hours as needed for moderate pain.   No orders of the defined types were placed in this encounter.   Need to obtain previous records Possible management options include:  Endometrial ablation

## 2014-03-09 LAB — WET PREP BY MOLECULAR PROBE
CANDIDA SPECIES: NEGATIVE
Gardnerella vaginalis: POSITIVE — AB
Trichomonas vaginosis: NEGATIVE

## 2014-03-09 LAB — PAP IG W/ RFLX HPV ASCU

## 2014-03-09 LAB — GC/CHLAMYDIA PROBE AMP
CT Probe RNA: NEGATIVE
GC Probe RNA: NEGATIVE

## 2014-03-09 LAB — HEPATITIS B SURFACE ANTIGEN: Hepatitis B Surface Ag: NEGATIVE

## 2014-03-09 LAB — RPR

## 2014-03-09 LAB — HIV ANTIBODY (ROUTINE TESTING W REFLEX): HIV: NONREACTIVE

## 2014-03-09 LAB — HEPATITIS C ANTIBODY: HCV Ab: NEGATIVE

## 2014-03-10 ENCOUNTER — Ambulatory Visit (HOSPITAL_COMMUNITY)
Admission: RE | Admit: 2014-03-10 | Discharge: 2014-03-10 | Disposition: A | Discharge status: Home / Self Care | Source: Ambulatory Visit | Attending: Obstetrics | Admitting: Obstetrics

## 2014-03-10 DIAGNOSIS — N72 Inflammatory disease of cervix uteri: Secondary | ICD-10-CM | POA: Insufficient documentation

## 2014-03-10 DIAGNOSIS — N920 Excessive and frequent menstruation with regular cycle: Secondary | ICD-10-CM | POA: Insufficient documentation

## 2014-03-10 DIAGNOSIS — N949 Unspecified condition associated with female genital organs and menstrual cycle: Secondary | ICD-10-CM | POA: Insufficient documentation

## 2014-03-22 ENCOUNTER — Encounter: Payer: Self-pay | Admitting: Obstetrics

## 2014-03-22 ENCOUNTER — Ambulatory Visit (INDEPENDENT_AMBULATORY_CARE_PROVIDER_SITE_OTHER): Admitting: Obstetrics

## 2014-03-22 VITALS — BP 128/84 | HR 78 | Temp 98.5°F | Ht 63.5 in | Wt 203.0 lb

## 2014-03-22 DIAGNOSIS — B9689 Other specified bacterial agents as the cause of diseases classified elsewhere: Secondary | ICD-10-CM

## 2014-03-22 DIAGNOSIS — N949 Unspecified condition associated with female genital organs and menstrual cycle: Secondary | ICD-10-CM

## 2014-03-22 DIAGNOSIS — N760 Acute vaginitis: Secondary | ICD-10-CM

## 2014-03-22 DIAGNOSIS — A499 Bacterial infection, unspecified: Secondary | ICD-10-CM

## 2014-03-22 MED ORDER — METRONIDAZOLE 500 MG PO TABS: 500.0000 mg | ORAL_TABLET | Freq: Two times a day (BID) | ORAL | Status: DC

## 2014-03-22 NOTE — Progress Notes (Signed)
Pt notified about lab results:  H/O Pelvic Pain  1.  Ultrasound wnl's.  2. Pap smear negative  3. Labs NL's  A/P:  Results of testing discussed.   Continue analgesics.               F/U prn.

## 2014-09-06 ENCOUNTER — Encounter: Payer: Self-pay | Admitting: Obstetrics

## 2015-01-06 ENCOUNTER — Emergency Department (HOSPITAL_COMMUNITY)

## 2015-01-06 ENCOUNTER — Encounter (HOSPITAL_COMMUNITY): Payer: Self-pay | Admitting: Emergency Medicine

## 2015-01-06 ENCOUNTER — Emergency Department (HOSPITAL_COMMUNITY)
Admission: EM | Admit: 2015-01-06 | Discharge: 2015-01-06 | Disposition: A | Discharge status: Home / Self Care | Attending: Emergency Medicine | Admitting: Emergency Medicine

## 2015-01-06 DIAGNOSIS — Z87440 Personal history of urinary (tract) infections: Secondary | ICD-10-CM | POA: Diagnosis not present

## 2015-01-06 DIAGNOSIS — Y998 Other external cause status: Secondary | ICD-10-CM | POA: Insufficient documentation

## 2015-01-06 DIAGNOSIS — M419 Scoliosis, unspecified: Secondary | ICD-10-CM | POA: Diagnosis not present

## 2015-01-06 DIAGNOSIS — S8991XA Unspecified injury of right lower leg, initial encounter: Secondary | ICD-10-CM | POA: Insufficient documentation

## 2015-01-06 DIAGNOSIS — Y9289 Other specified places as the place of occurrence of the external cause: Secondary | ICD-10-CM | POA: Diagnosis not present

## 2015-01-06 DIAGNOSIS — Z87448 Personal history of other diseases of urinary system: Secondary | ICD-10-CM | POA: Insufficient documentation

## 2015-01-06 DIAGNOSIS — Y9389 Activity, other specified: Secondary | ICD-10-CM | POA: Diagnosis not present

## 2015-01-06 DIAGNOSIS — M79604 Pain in right leg: Secondary | ICD-10-CM

## 2015-01-06 DIAGNOSIS — W19XXXA Unspecified fall, initial encounter: Secondary | ICD-10-CM

## 2015-01-06 DIAGNOSIS — G40909 Epilepsy, unspecified, not intractable, without status epilepticus: Secondary | ICD-10-CM | POA: Diagnosis not present

## 2015-01-06 DIAGNOSIS — Z792 Long term (current) use of antibiotics: Secondary | ICD-10-CM | POA: Insufficient documentation

## 2015-01-06 DIAGNOSIS — M549 Dorsalgia, unspecified: Secondary | ICD-10-CM

## 2015-01-06 DIAGNOSIS — Z79899 Other long term (current) drug therapy: Secondary | ICD-10-CM | POA: Insufficient documentation

## 2015-01-06 DIAGNOSIS — Z8742 Personal history of other diseases of the female genital tract: Secondary | ICD-10-CM | POA: Insufficient documentation

## 2015-01-06 DIAGNOSIS — I10 Essential (primary) hypertension: Secondary | ICD-10-CM | POA: Insufficient documentation

## 2015-01-06 DIAGNOSIS — S3992XA Unspecified injury of lower back, initial encounter: Secondary | ICD-10-CM | POA: Diagnosis not present

## 2015-01-06 DIAGNOSIS — S79911A Unspecified injury of right hip, initial encounter: Secondary | ICD-10-CM | POA: Diagnosis present

## 2015-01-06 DIAGNOSIS — W010XXA Fall on same level from slipping, tripping and stumbling without subsequent striking against object, initial encounter: Secondary | ICD-10-CM | POA: Insufficient documentation

## 2015-01-06 DIAGNOSIS — G43009 Migraine without aura, not intractable, without status migrainosus: Secondary | ICD-10-CM

## 2015-01-06 DIAGNOSIS — F410 Panic disorder [episodic paroxysmal anxiety] without agoraphobia: Secondary | ICD-10-CM | POA: Insufficient documentation

## 2015-01-06 DIAGNOSIS — M25551 Pain in right hip: Secondary | ICD-10-CM

## 2015-01-06 DIAGNOSIS — J45909 Unspecified asthma, uncomplicated: Secondary | ICD-10-CM | POA: Diagnosis not present

## 2015-01-06 DIAGNOSIS — M25561 Pain in right knee: Secondary | ICD-10-CM

## 2015-01-06 MED ORDER — HYDROCODONE-ACETAMINOPHEN 5-325 MG PO TABS: 1.0000 | ORAL_TABLET | ORAL | Status: DC | PRN

## 2015-01-06 MED ORDER — METOCLOPRAMIDE HCL 5 MG/ML IJ SOLN
10.0000 mg | Freq: Once | INTRAMUSCULAR | Status: AC
  Administered 2015-01-06: 10 mg via INTRAVENOUS
  Filled 2015-01-06: qty 2

## 2015-01-06 MED ORDER — SODIUM CHLORIDE 0.9 % IV BOLUS (SEPSIS)
1000.0000 mL | Freq: Once | INTRAVENOUS | Status: AC
  Administered 2015-01-06: 1000 mL via INTRAVENOUS

## 2015-01-06 MED ORDER — IBUPROFEN 800 MG PO TABS: 800.0000 mg | ORAL_TABLET | Freq: Once | ORAL | Status: DC

## 2015-01-06 MED ORDER — DIPHENHYDRAMINE HCL 50 MG/ML IJ SOLN
25.0000 mg | Freq: Once | INTRAMUSCULAR | Status: AC
  Administered 2015-01-06: 25 mg via INTRAVENOUS
  Filled 2015-01-06: qty 1

## 2015-01-06 MED ORDER — DEXAMETHASONE SODIUM PHOSPHATE 10 MG/ML IJ SOLN
10.0000 mg | Freq: Once | INTRAMUSCULAR | Status: AC
  Administered 2015-01-06: 10 mg via INTRAVENOUS
  Filled 2015-01-06: qty 1

## 2015-01-06 MED ORDER — IBUPROFEN 800 MG PO TABS: 800.0000 mg | ORAL_TABLET | Freq: Three times a day (TID) | ORAL | Status: DC | PRN

## 2015-01-06 MED ORDER — KETOROLAC TROMETHAMINE 30 MG/ML IJ SOLN
30.0000 mg | Freq: Once | INTRAMUSCULAR | Status: AC
  Administered 2015-01-06: 30 mg via INTRAVENOUS
  Filled 2015-01-06: qty 1

## 2015-01-06 NOTE — Discharge Instructions (Signed)
Lumbosacral Strain Lumbosacral strain is a strain of any of the parts that make up your lumbosacral vertebrae. Your lumbosacral vertebrae are the bones that make up the lower third of your backbone. Your lumbosacral vertebrae are held together by muscles and tough, fibrous tissue (ligaments).  CAUSES  A sudden blow to your back can cause lumbosacral strain. Also, anything that causes an excessive stretch of the muscles in the low back can cause this strain. This is typically seen when people exert themselves strenuously, fall, lift heavy objects, bend, or crouch repeatedly. RISK FACTORS  Physically demanding work.  Participation in pushing or pulling sports or sports that require a sudden twist of the back (tennis, golf, baseball).  Weight lifting.  Excessive lower back curvature.  Forward-tilted pelvis.  Weak back or abdominal muscles or both.  Tight hamstrings. SIGNS AND SYMPTOMS  Lumbosacral strain may cause pain in the area of your injury or pain that moves (radiates) down your leg.  DIAGNOSIS Your health care provider can often diagnose lumbosacral strain through a physical exam. In some cases, you may need tests such as X-ray exams.  TREATMENT  Treatment for your lower back injury depends on many factors that your clinician will have to evaluate. However, most treatment will include the use of anti-inflammatory medicines. HOME CARE INSTRUCTIONS   Avoid hard physical activities (tennis, racquetball, waterskiing) if you are not in proper physical condition for it. This may aggravate or create problems.  If you have a back problem, avoid sports requiring sudden body movements. Swimming and walking are generally safer activities.  Maintain good posture.  Maintain a healthy weight.  For acute conditions, you may put ice on the injured area.  Put ice in a plastic bag.  Place a towel between your skin and the bag.  Leave the ice on for 20 minutes, 2-3 times a day.  When the  low back starts healing, stretching and strengthening exercises may be recommended. SEEK MEDICAL CARE IF:  Your back pain is getting worse.  You experience severe back pain not relieved with medicines. SEEK IMMEDIATE MEDICAL CARE IF:   You have numbness, tingling, weakness, or problems with the use of your arms or legs.  There is a change in bowel or bladder control.  You have increasing pain in any area of the body, including your belly (abdomen).  You notice shortness of breath, dizziness, or feel faint.  You feel sick to your stomach (nauseous), are throwing up (vomiting), or become sweaty.  You notice discoloration of your toes or legs, or your feet get very cold. MAKE SURE YOU:   Understand these instructions.  Will watch your condition.  Will get help right away if you are not doing well or get worse. Document Released: 08/01/2005 Document Revised: 10/27/2013 Document Reviewed: 06/10/2013 [facility] Patient Information 2015 [place], [place]. This information is not intended to replace advice given to you by your health care provider. Make sure you discuss any questions you have with your health care provider.    Contusion A contusion is a deep bruise. Contusions are the result of an injury that caused bleeding under the skin. The contusion may turn blue, purple, or yellow. Minor injuries will give you a painless contusion, but more severe contusions may stay painful and swollen for a few weeks.  CAUSES  A contusion is usually caused by a blow, trauma, or direct force to an area of the body. SYMPTOMS   Swelling and redness of the injured area.  Bruising of  the injured area.  Tenderness and soreness of the injured area.  Pain. DIAGNOSIS  The diagnosis can be made by taking a history and physical exam. An X-ray, CT scan, or MRI may be needed to determine if there were any associated injuries, such as fractures. TREATMENT  Specific treatment will depend on what area of  the body was injured. In general, the best treatment for a contusion is resting, icing, elevating, and applying cold compresses to the injured area. Over-the-counter medicines may also be recommended for pain control. Ask your caregiver what the best treatment is for your contusion. HOME CARE INSTRUCTIONS   Put ice on the injured area.  Put ice in a plastic bag.  Place a towel between your skin and the bag.  Leave the ice on for 15-20 minutes, 3-4 times a day, or as directed by your health care provider.  Only take over-the-counter or prescription medicines for pain, discomfort, or fever as directed by your caregiver. Your caregiver may recommend avoiding anti-inflammatory medicines (aspirin, ibuprofen, and naproxen) for 48 hours because these medicines may increase bruising.  Rest the injured area.  If possible, elevate the injured area to reduce swelling. SEEK IMMEDIATE MEDICAL CARE IF:   You have increased bruising or swelling.  You have pain that is getting worse.  Your swelling or pain is not relieved with medicines. MAKE SURE YOU:   Understand these instructions.  Will watch your condition.  Will get help right away if you are not doing well or get worse. Document Released: 08/01/2005 Document Revised: 10/27/2013 Document Reviewed: 08/27/2011 [facility] Patient Information 2015 [place], [place]. This information is not intended to replace advice given to you by your health care provider. Make sure you discuss any questions you have with your health care provider.    RICE: Routine Care for Injuries The routine care of many injuries includes Rest, Ice, Compression, and Elevation (RICE). HOME CARE INSTRUCTIONS  Rest is needed to allow your body to heal. Routine activities can usually be resumed when comfortable. Injured tendons and bones can take up to 6 weeks to heal. Tendons are the cord-like structures that attach muscle to bone.  Ice following an injury helps keep the  swelling down and reduces pain.  Put ice in a plastic bag.  Place a towel between your skin and the bag.  Leave the ice on for 15-20 minutes, 3-4 times a day, or as directed by your health care provider. Do this while awake, for the first 24 to 48 hours. After that, continue as directed by your caregiver.  Compression helps keep swelling down. It also gives support and helps with discomfort. If an elastic bandage has been applied, it should be removed and reapplied every 3 to 4 hours. It should not be applied tightly, but firmly enough to keep swelling down. Watch fingers or toes for swelling, bluish discoloration, coldness, numbness, or excessive pain. If any of these problems occur, remove the bandage and reapply loosely. Contact your caregiver if these problems continue.  Elevation helps reduce swelling and decreases pain. With extremities, such as the arms, hands, legs, and feet, the injured area should be placed near or above the level of the heart, if possible. SEEK IMMEDIATE MEDICAL CARE IF:  You have persistent pain and swelling.  You develop redness, numbness, or unexpected weakness.  Your symptoms are getting worse rather than improving after several days. These symptoms may indicate that further evaluation or further X-rays are needed. Sometimes, X-rays may not show a  small broken bone (fracture) until 1 week or 10 days later. Make a follow-up appointment with your caregiver. Ask when your X-ray results will be ready. Make sure you get your X-ray results. Document Released: 02/03/2001 Document Revised: 10/27/2013 Document Reviewed: 03/23/2011 [facility] Patient Information 2015 [place], [place]. This information is not intended to replace advice given to you by your health care provider. Make sure you discuss any questions you have with your health care provider.  Migraine Headache A migraine headache is an intense, throbbing pain on one or both sides of your head. A migraine can last  for 30 minutes to several hours. CAUSES  The exact cause of a migraine headache is not always known. However, a migraine may be caused when nerves in the brain become irritated and release chemicals that cause inflammation. This causes pain. Certain things may also trigger migraines, such as:  Alcohol.  Smoking.  Stress.  Menstruation.  Aged cheeses.  Foods or drinks that contain nitrates, glutamate, aspartame, or tyramine.  Lack of sleep.  Chocolate.  Caffeine.  Hunger.  Physical exertion.  Fatigue.  Medicines used to treat chest pain (nitroglycerine), birth control pills, estrogen, and some blood pressure medicines. SIGNS AND SYMPTOMS  Pain on one or both sides of your head.  Pulsating or throbbing pain.  Severe pain that prevents daily activities.  Pain that is aggravated by any physical activity.  Nausea, vomiting, or both.  Dizziness.  Pain with exposure to bright lights, loud noises, or activity.  General sensitivity to bright lights, loud noises, or smells. Before you get a migraine, you may get warning signs that a migraine is coming (aura). An aura may include:  Seeing flashing lights.  Seeing bright spots, halos, or zigzag lines.  Having tunnel vision or blurred vision.  Having feelings of numbness or tingling.  Having trouble talking.  Having muscle weakness. DIAGNOSIS  A migraine headache is often diagnosed based on:  Symptoms.  Physical exam.  A CT scan or MRI of your head. These imaging tests cannot diagnose migraines, but they can help rule out other causes of headaches. TREATMENT Medicines may be given for pain and nausea. Medicines can also be given to help prevent recurrent migraines.  HOME CARE INSTRUCTIONS  Only take over-the-counter or prescription medicines for pain or discomfort as directed by your health care provider. The use of long-term narcotics is not recommended.  Lie down in a dark, quiet room when you have a  migraine.  Keep a journal to find out what may trigger your migraine headaches. For example, write down:  What you eat and drink.  How much sleep you get.  Any change to your diet or medicines.  Limit alcohol consumption.  Quit smoking if you smoke.  Get 7-9 hours of sleep, or as recommended by your health care provider.  Limit stress.  Keep lights dim if bright lights bother you and make your migraines worse. SEEK IMMEDIATE MEDICAL CARE IF:   Your migraine becomes severe.  You have a fever.  You have a stiff neck.  You have vision loss.  You have muscular weakness or loss of muscle control.  You start losing your balance or have trouble walking.  You feel faint or pass out.  You have severe symptoms that are different from your first symptoms. MAKE SURE YOU:   Understand these instructions.  Will watch your condition.  Will get help right away if you are not doing well or get worse. Document Released: 10/22/2005 Document Revised:  03/08/2014 Document Reviewed: 06/29/2013 [employer] Patient Information 2015 [place], [place]. This information is not intended to replace advice given to you by your health care provider. Make sure you discuss any questions you have with your health care provider.

## 2015-01-06 NOTE — ED Notes (Signed)
Pt states that she has been dealing with rt hip and rt sided low back pain x >2 yrs from a fall in the tub.  States that pain has been increasing and then she fell 2 days ago which exacerbated her pain.

## 2015-01-06 NOTE — ED Provider Notes (Signed)
TIME SEEN: 9:03 AM  CHIEF COMPLAINT: Right hip and right lower back pain, migraine  HPI: Pt is a 39 y.o. female with history of scoliosis, anxiety, hypertension, migraine and vertigo who presents the emergency department with complaints of right-sided hip and right lower back pain for the past 2 days. States that 2 years ago she had a fall where she dislocated her knee and required surgery to the right knee. States since that time she's had chronic right hip and back pain that was exacerbated after she had a fall 2 days ago where she tripped and landed on her knee. Did not hit her head or lose consciousness. She is not on anticoagulation. States pain is worse with movement. Better with rest. Also complaining of a left-sided throbbing headache with vertigo but similar to her migraines that are usually accompanied with vertigo. Worse with lights. No nausea or vomiting. Patient reports that she takes gabapentin and meclizine at home for her migraines. No new numbness, tingling, focal weakness. No bowel or bladder incontinence. No fever.  ROS: See HPI Constitutional: no fever  Eyes: no drainage  ENT: no runny nose   Cardiovascular:  no chest pain  Resp: no SOB  GI: no vomiting GU: no dysuria Integumentary: no rash  Allergy: no hives  Musculoskeletal: no leg swelling  Neurological: no slurred speech ROS otherwise negative  PAST MEDICAL HISTORY/PAST SURGICAL HISTORY:  Past Medical History  Diagnosis Date  . Panic attacks   . Scoliosis   . Mitral valve prolapse   . Asthma     As a child  . Renal disorder   . UTI (lower urinary tract infection)   . Hypertension   . Ovarian cyst   . Migraine   . Vertigo   . Degenerative disk disease     MEDICATIONS:  Prior to Admission medications   Medication Sig Start Date End Date Taking? Authorizing Provider  gabapentin (NEURONTIN) 300 MG capsule Take 300 mg by mouth 3 (three) times daily.    Historical Provider, MD  HYDROcodone-acetaminophen  (NORCO/VICODIN) 5-325 MG per tablet Take 1 tablet by mouth every 6 (six) hours as needed for moderate pain.    Historical Provider, MD  ibuprofen (ADVIL,MOTRIN) 200 MG tablet Take 400 mg by mouth every 6 (six) hours as needed for headache.    Historical Provider, MD  meclizine (ANTIVERT) 25 MG tablet Take 1 tablet (25 mg total) by mouth 3 (three) times daily as needed for dizziness. 10/05/13   Nat Christen, MD  metroNIDAZOLE (FLAGYL) 500 MG tablet Take 1 tablet (500 mg total) by mouth 2 (two) times daily. 03/22/14   Shelly Bombard, MD  naproxen (NAPROSYN) 500 MG tablet Take 500 mg by mouth 2 (two) times daily with a meal.    Historical Provider, MD  nitroGLYCERIN (NITROSTAT) 0.4 MG SL tablet Place 0.4 mg under the tongue every 5 (five) minutes as needed for chest pain.    Historical Provider, MD    ALLERGIES:  Allergies  Allergen Reactions  . Sulfonamide Derivatives Rash    Patient was hospitalized  . Norvasc [Amlodipine] Swelling  . Amlodipine Besylate Rash    SOCIAL HISTORY:  History  Substance Use Topics  . Smoking status: Former Research scientist (life sciences)  . Smokeless tobacco: Never Used  . Alcohol Use: No    FAMILY HISTORY: Family History  Problem Relation Age of Onset  . Hypertension Mother   . Diabetes Mother   . Stroke Mother   . Heart disease Mother   .  Hypertension Father   . Diabetes Father   . Dementia Father   . Schizophrenia Father     EXAM: BP 140/88 mmHg  Pulse 86  Temp(Src) 97.8 F (36.6 C) (Oral)  Resp 18  SpO2 97% CONSTITUTIONAL: Alert and oriented and responds appropriately to questions. Well-appearing; well-nourished; GCS 15 HEAD: Normocephalic; atraumatic EYES: Conjunctivae clear, PERRL, EOMI ENT: normal nose; no rhinorrhea; moist mucous membranes; pharynx without lesions noted; no dental injury; no septal hematoma NECK: Supple, no meningismus, no LAD; no midline spinal tenderness, step-off or deformity CARD: RRR; S1 and S2 appreciated; no murmurs, no clicks, no  rubs, no gallops RESP: Normal chest excursion without splinting or tachypnea; breath sounds clear and equal bilaterally; no wheezes, no rhonchi, no rales; chest wall stable, nontender to palpation ABD/GI: Normal bowel sounds; non-distended; soft, non-tender, no rebound, no guarding PELVIS:  stable, nontender to palpation BACK:  The back appears normal and is tender to palpation over the right lumbar paraspinal musculature without any lesions, there is no CVA tenderness; no midline spinal tenderness, step-off or deformity EXT: Mild to palpation diffusely over the right posterior hip and iliac crest, tender to palpation over the right proximal tibia with no fibular head tenderness, Normal ROM in all joints; otherwise extremities are non-tender to palpation; no edema; normal capillary refill; no cyanosis, component soft, no joint effusions, no erythema or warmth, no leg length discrepancy, 2+ DP pulses bilaterally    SKIN: Normal color for age and race; warm NEURO: Moves all extremities equally, sensation to light touch intact diffusely, cranial nerves II through XII intact, no nystagmus, no clonus, normal reflexes in upper and lower kidneys bilaterally PSYCH: The patient's mood and manner are appropriate. Grooming and personal hygiene are appropriate.  MEDICAL DECISION MAKING: Patient here with complaints of chronic pain. Is complaining of right paraspinal muscular pain with no midline tenderness and a normal neurologic exam. Also having right hip pain and is tender over her posterior iliac crest. Is requesting that I perform an x-ray today. We'll obtain x-rays of the right knee and right hip.  Patient also complaining of migraine. We'll give Toradol, Reglan, Benadryl, Decadron and IV fluids. Patient states she drove herself to the emergency department. Discussed with her that I cannot give her narcotics in the ED if she is driving home but we can discharge her with prescription for pain control at home. I  do not feel she needs imaging of her head given she has a history of migraines and states this feels similar.  ED PROGRESS: Pt reports migraine is gone after migraine cocktail.  Still complaining of right hip pain and back pain. We'll discharge with prescription for Vicodin to take as needed as well as ibuprofen. X-ray showed no acute injury. Discussed return precautions and supportive care instructions. She verbalized understanding and is comfortable with plan.     [place], DO 01/06/15 1155

## 2015-05-04 IMAGING — CR DG KNEE COMPLETE 4+V*R*
4 series · 4 of 4 positions shown · non-contrast
Comparison: 01/09/2012.

CLINICAL DATA: Fall 2 days ago.  Knee pain.  Initial encounter.

EXAM:
RIGHT KNEE - COMPLETE 4+ VIEW

[t knee ap right]
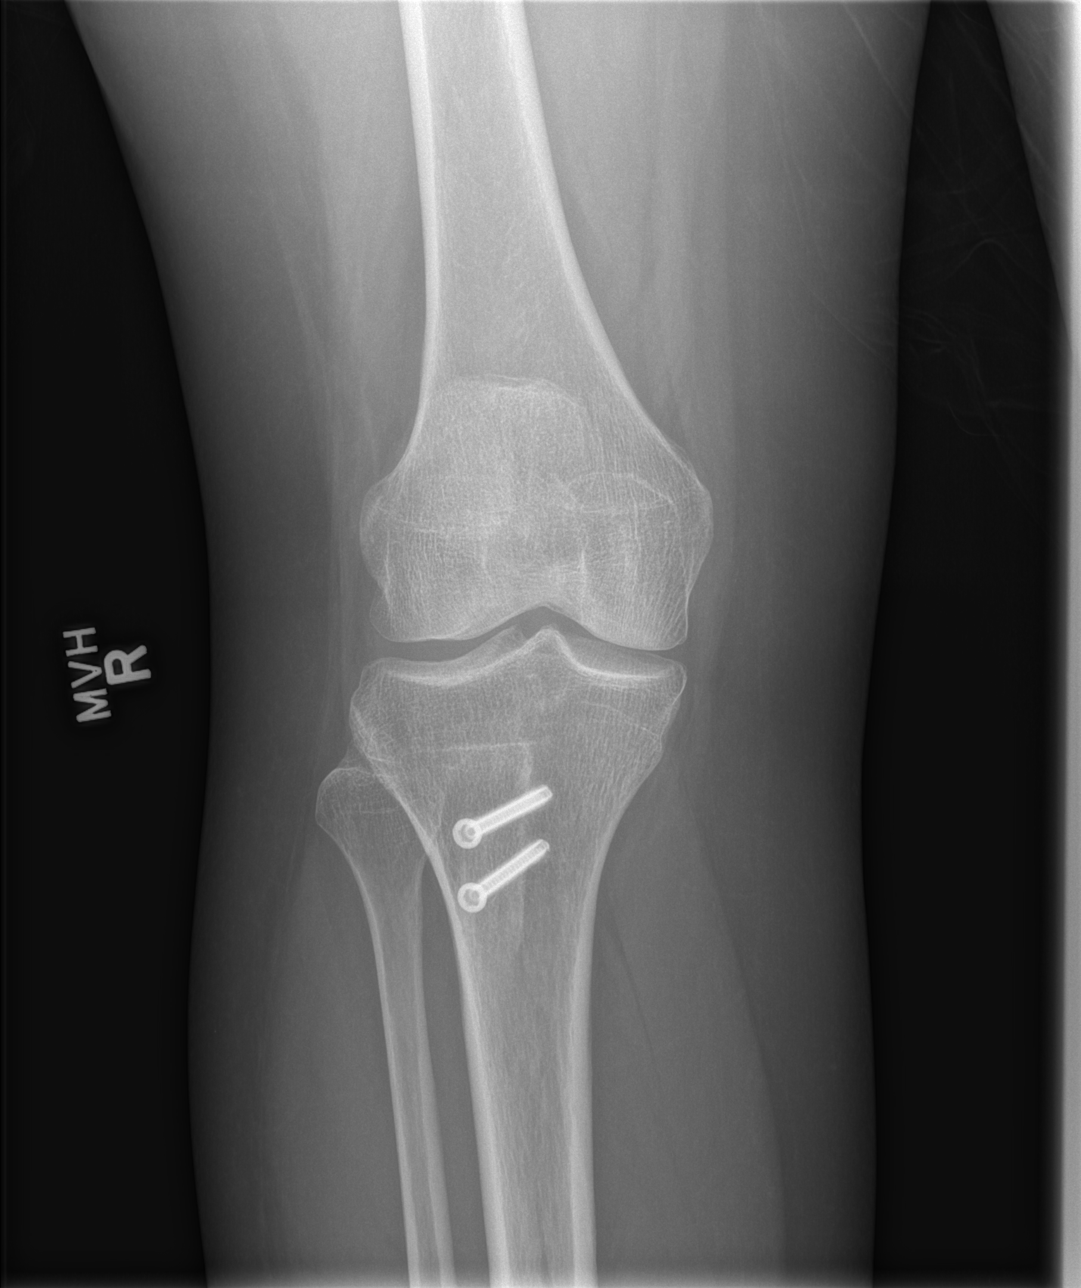

[t knee obl right (1 of 2)]
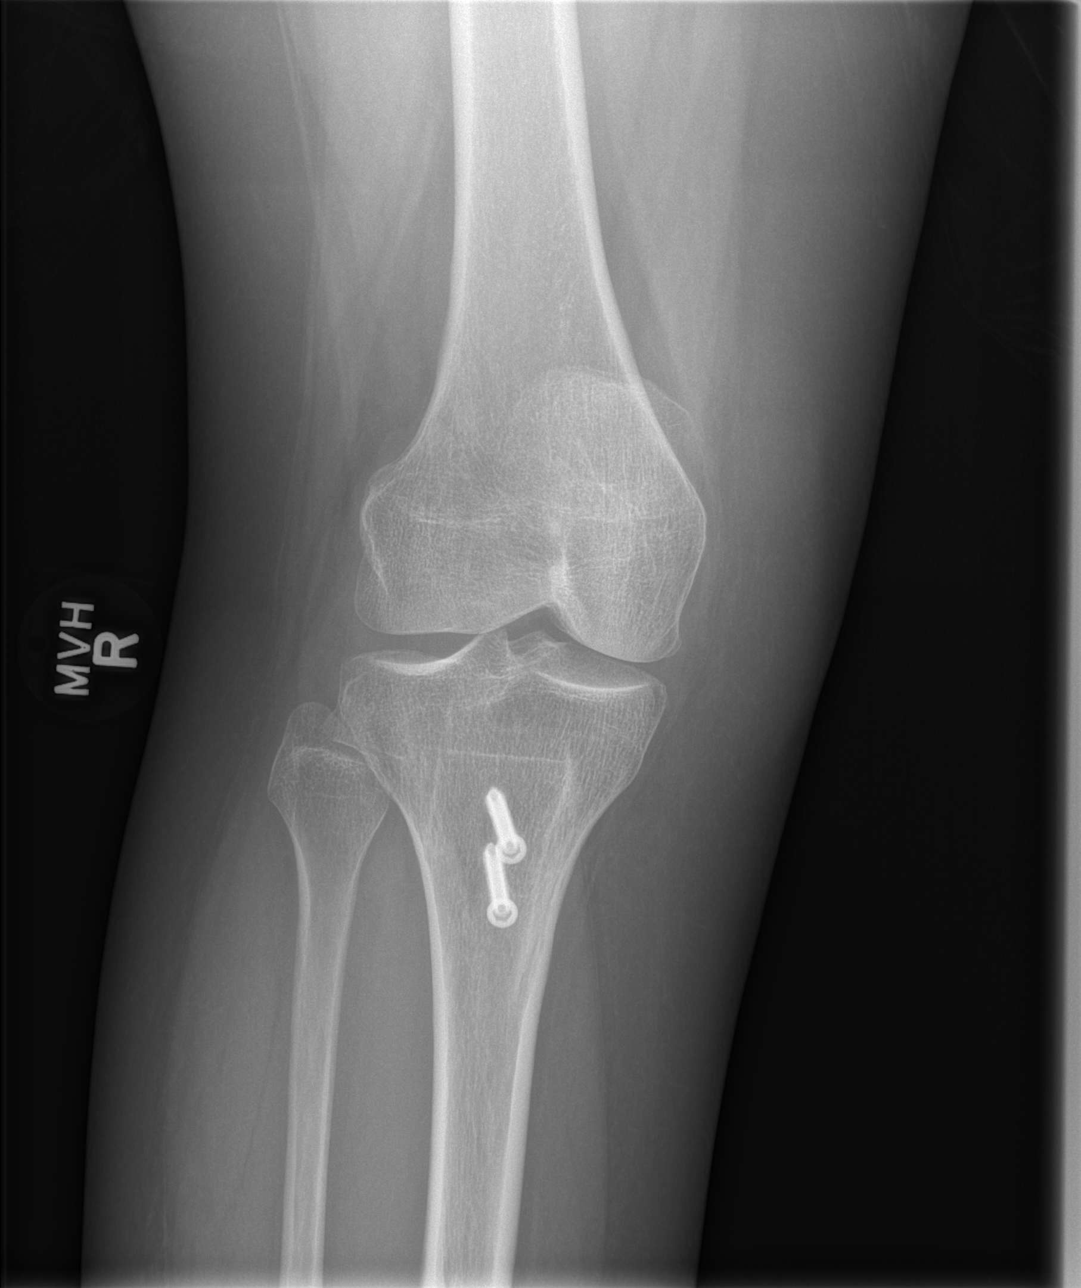

[t knee obl right (2 of 2)]
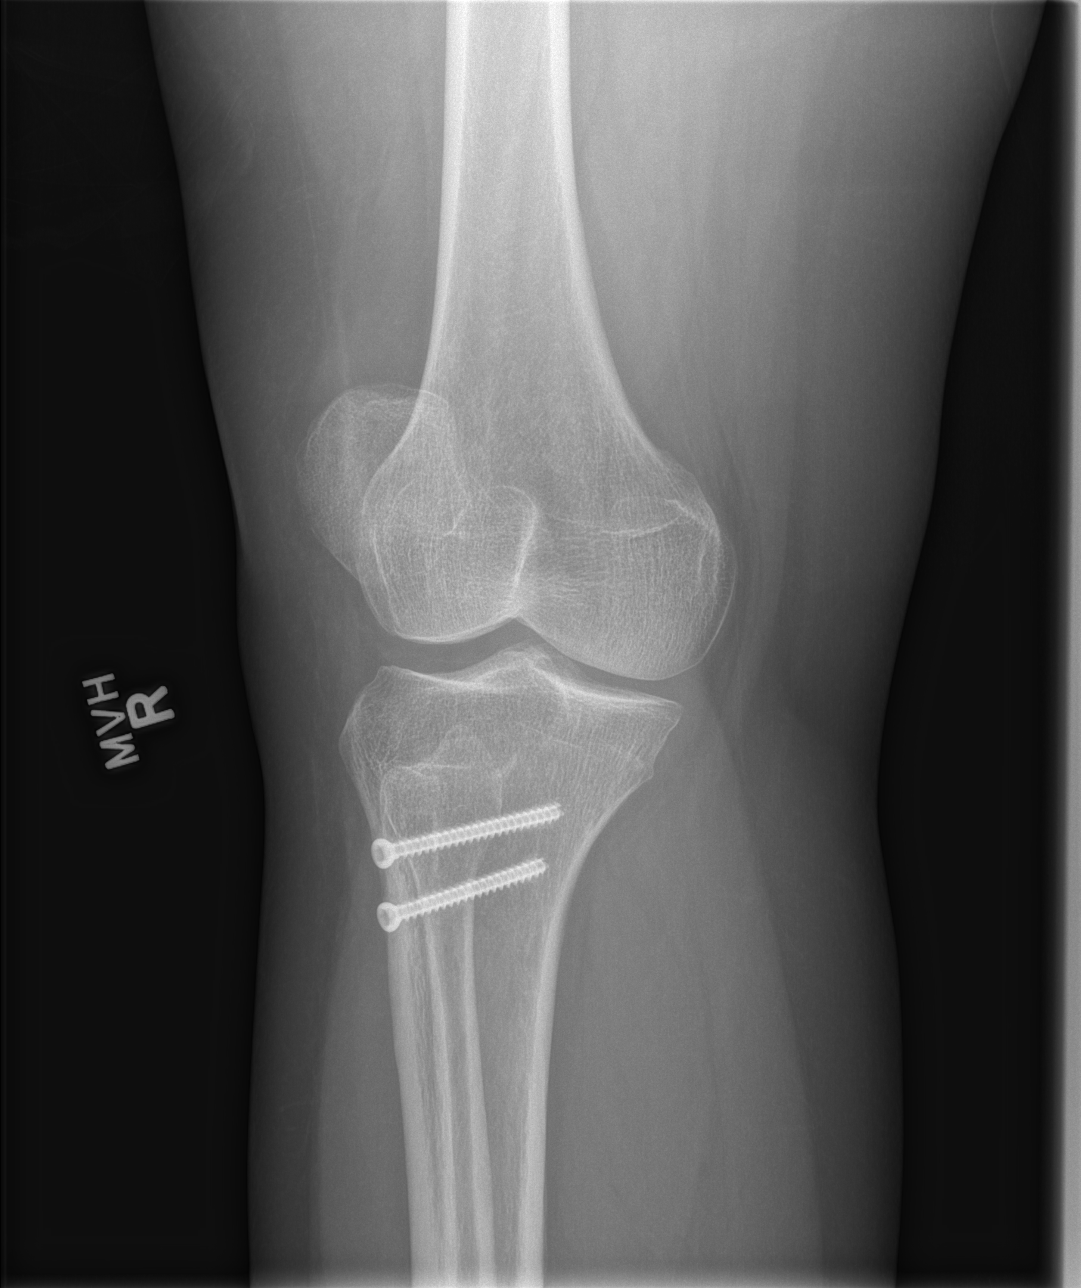

[x knee lat right]
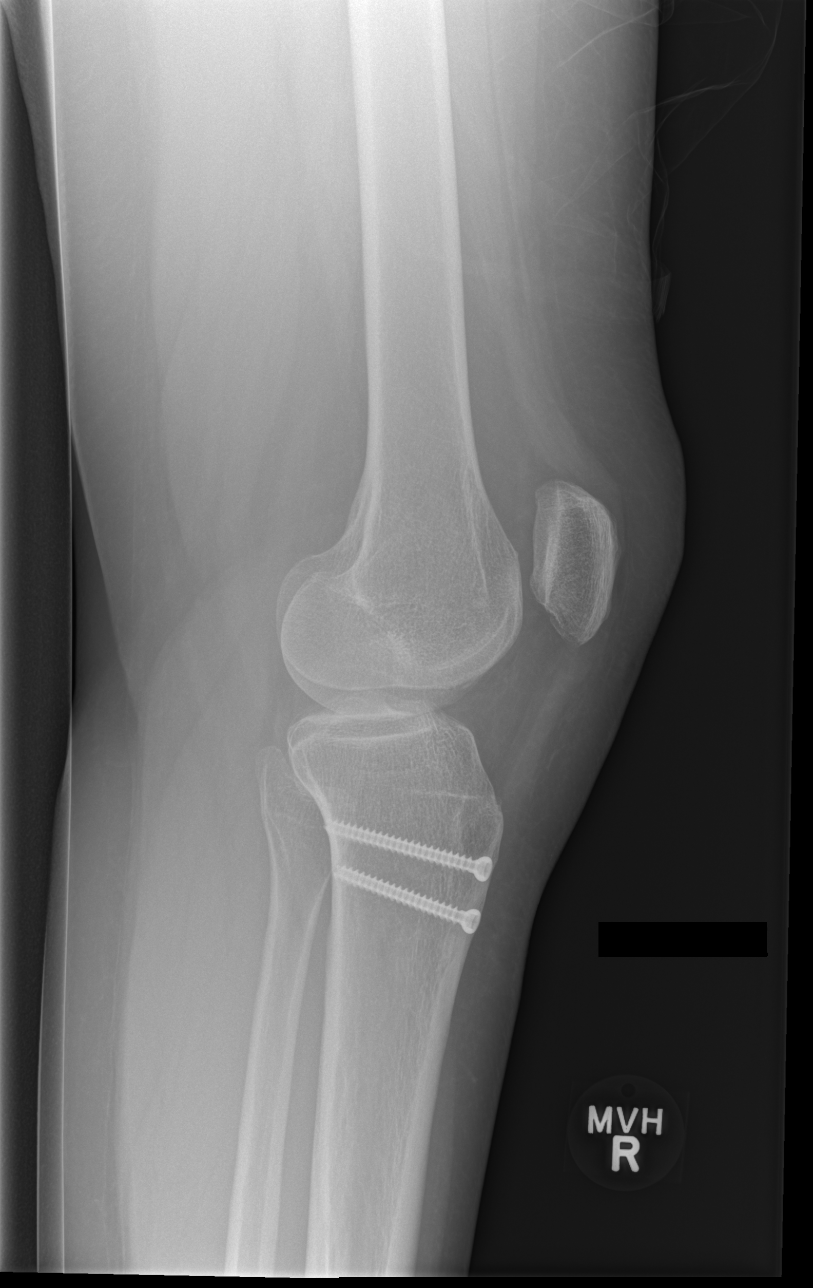

[4 of 4 positions shown; findings below may reference images not displayed]

FINDINGS: There are new cannulated lag screws in the tibial tuberosity. The
alignment of the knee is anatomic. No fracture. No destructive
osseous lesions. Negative for effusion.
IMPRESSION: *No acute abnormality.
*New cannulated lag screws in the tibial tuberosity from the prior
exam.

## 2015-05-04 IMAGING — CR DG HIP (WITH OR WITHOUT PELVIS) 2-3V*R*
3 series · 3 of 3 positions shown · non-contrast
Comparison: None.

CLINICAL DATA: Fall 2 days ago, right hip pain, right knee pain

EXAM:
RIGHT HIP (WITH PELVIS) 2-3 VIEWS

[t pelvis ap]
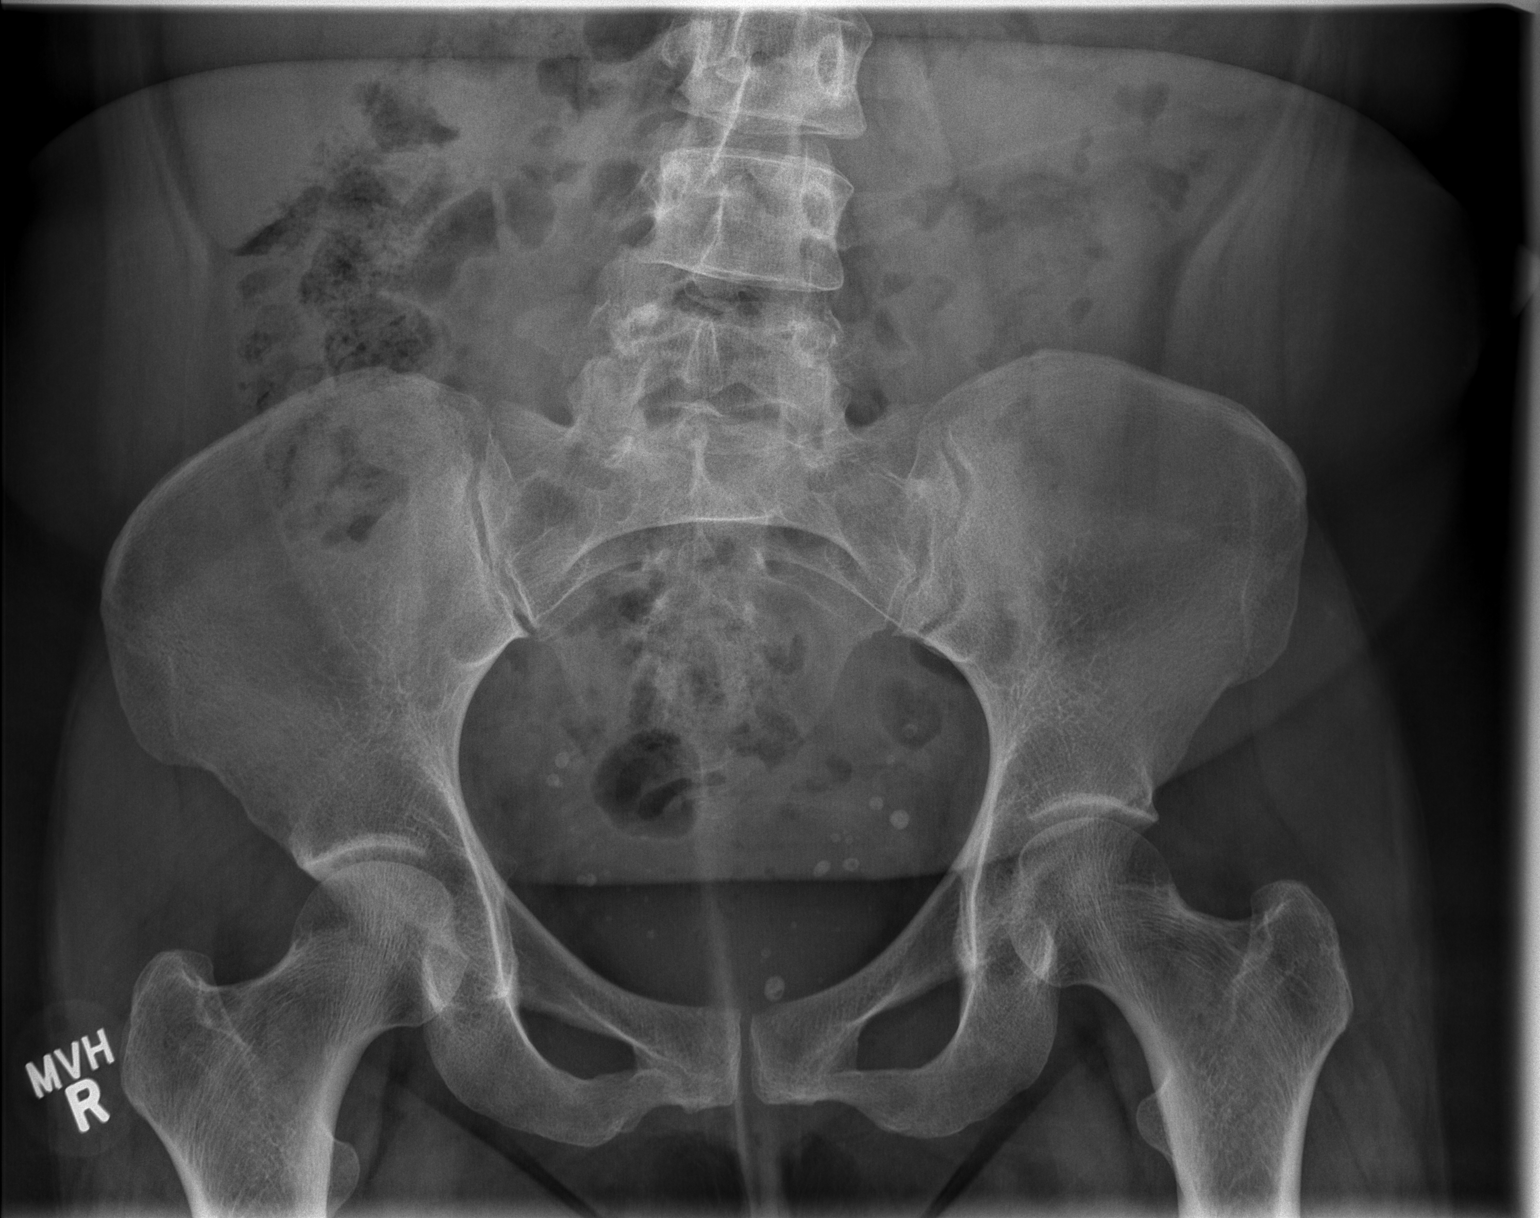

[t hip ap right]
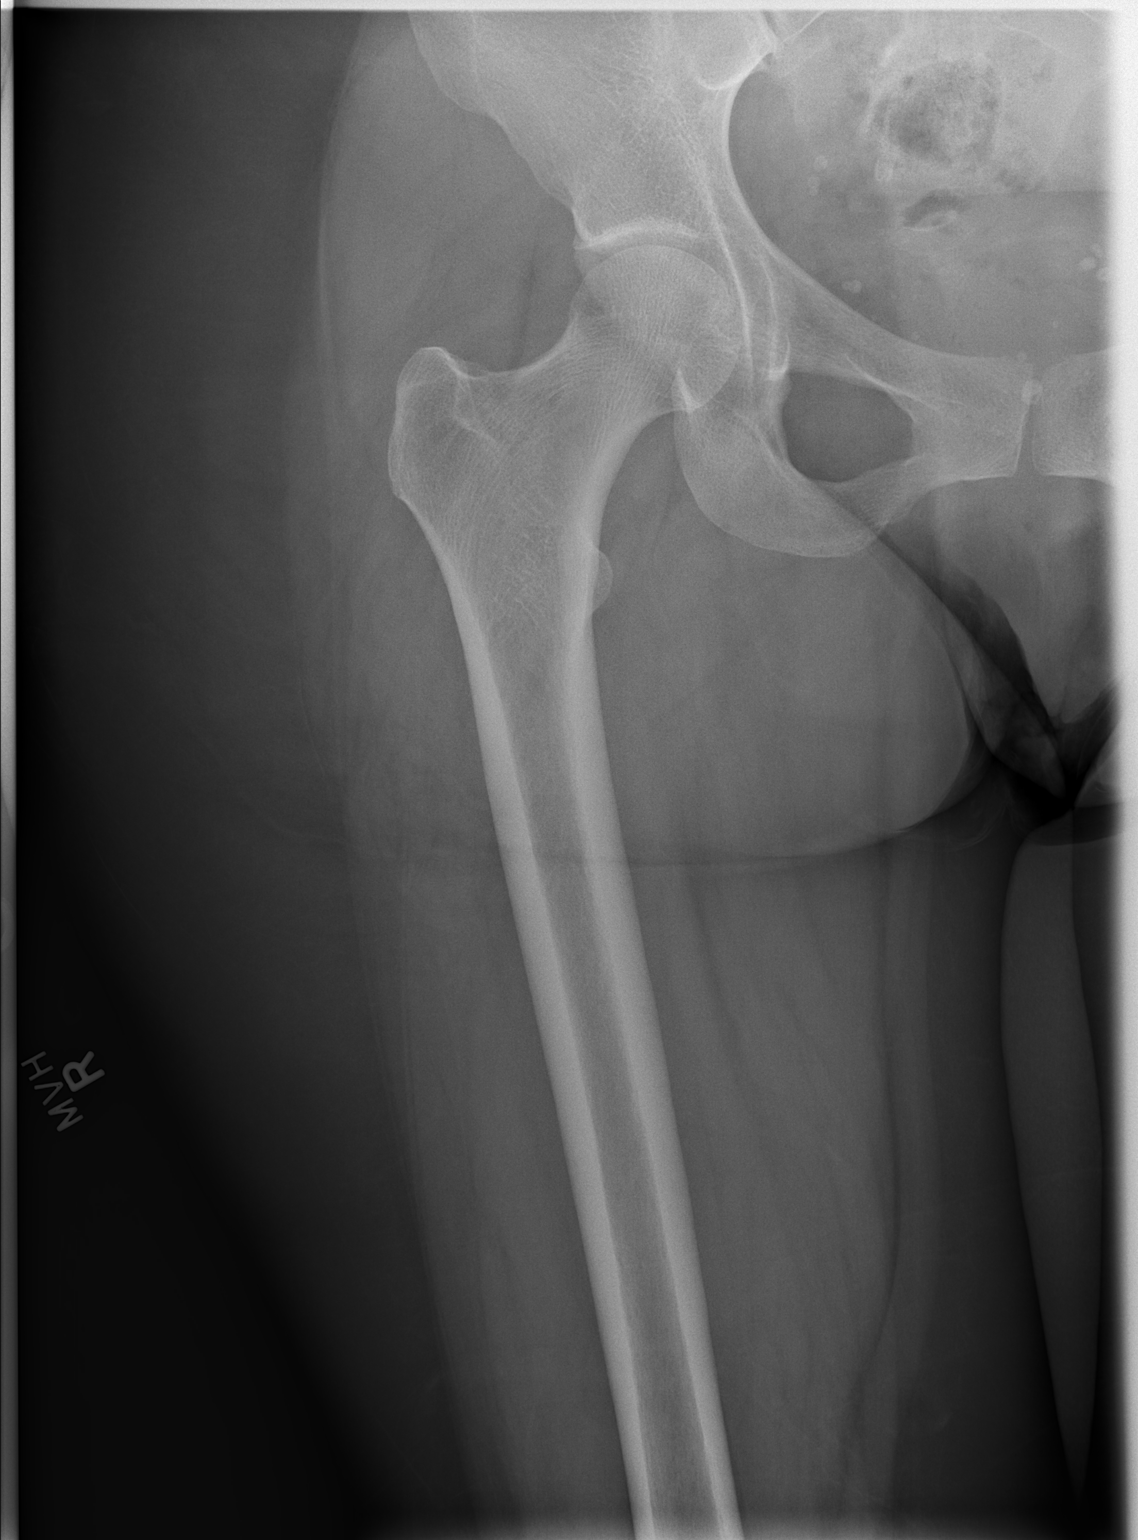

[t hip frog leg right]
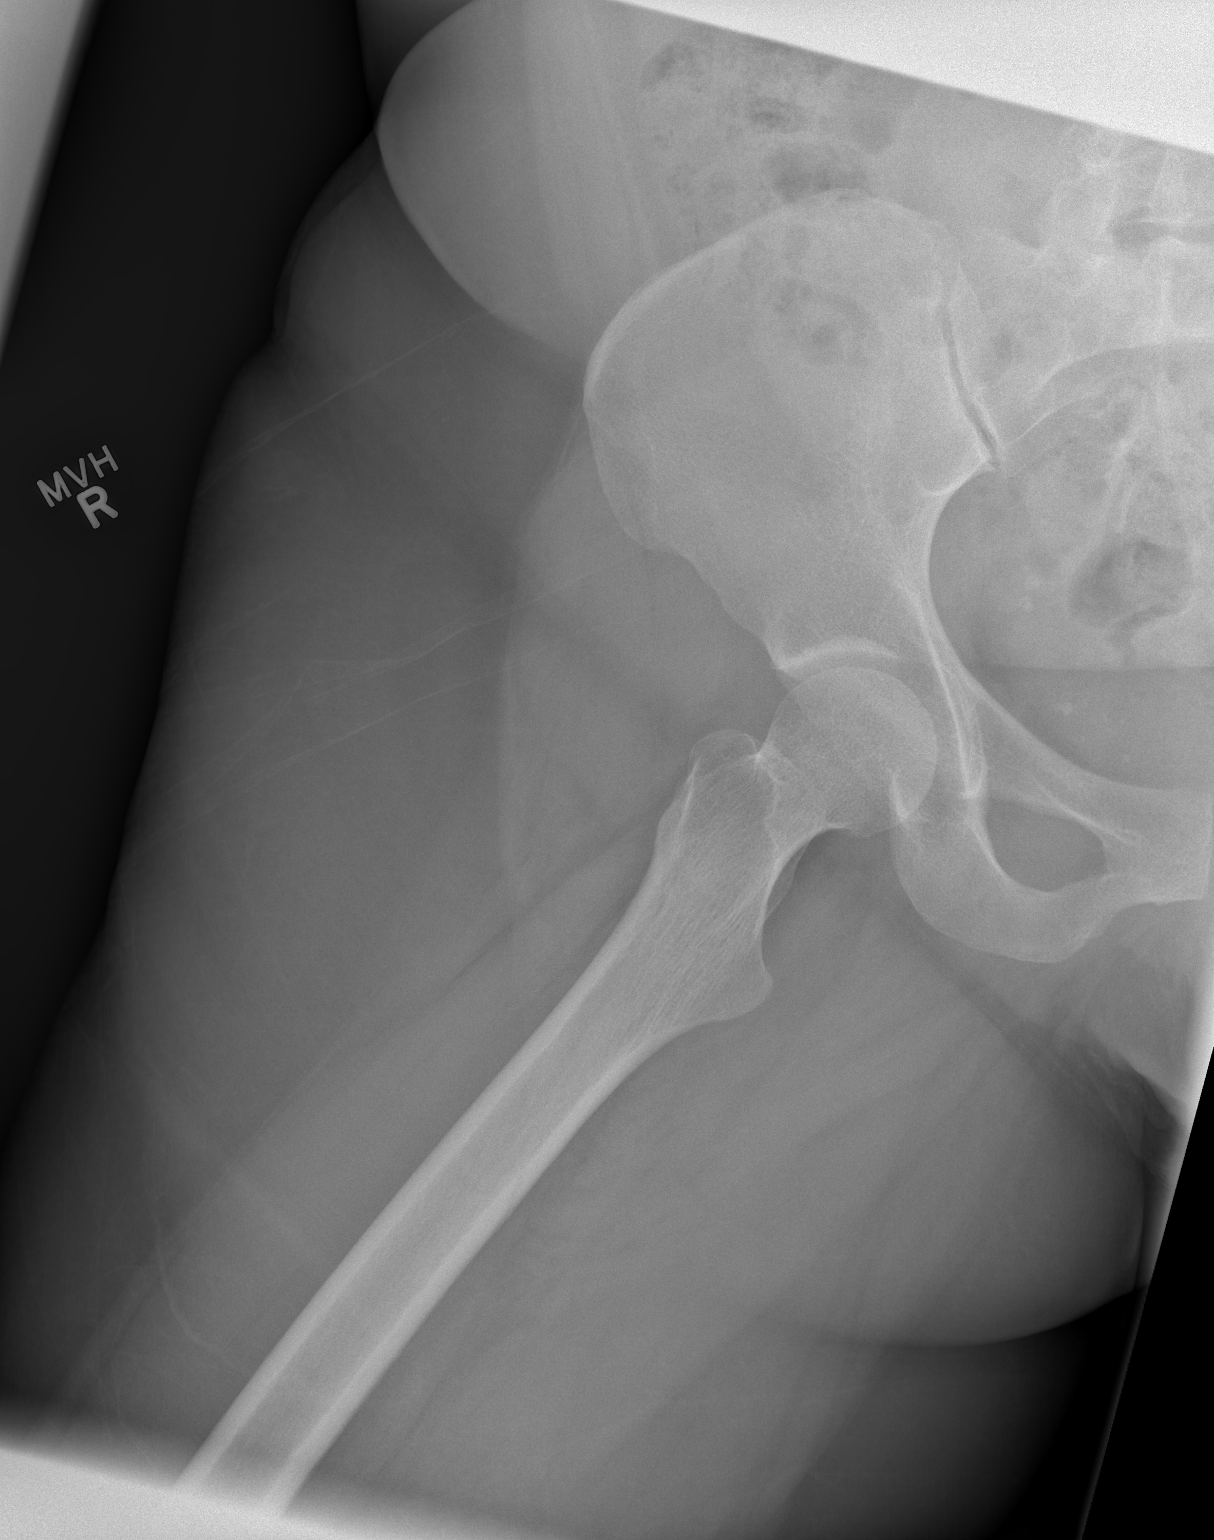

[3 of 3 positions shown; findings below may reference images not displayed]

FINDINGS: Three views of the right hip submitted. No acute fracture or
subluxation. No radiopaque foreign body.
IMPRESSION: Negative

## 2015-06-27 ENCOUNTER — Encounter (HOSPITAL_COMMUNITY): Payer: Self-pay | Admitting: *Deleted

## 2015-06-27 ENCOUNTER — Emergency Department (HOSPITAL_COMMUNITY)
Admission: EM | Admit: 2015-06-27 | Discharge: 2015-06-27 | Disposition: A | Discharge status: Home / Self Care | Attending: Emergency Medicine | Admitting: Emergency Medicine

## 2015-06-27 ENCOUNTER — Emergency Department (HOSPITAL_COMMUNITY)

## 2015-06-27 DIAGNOSIS — I10 Essential (primary) hypertension: Secondary | ICD-10-CM | POA: Insufficient documentation

## 2015-06-27 DIAGNOSIS — G43909 Migraine, unspecified, not intractable, without status migrainosus: Secondary | ICD-10-CM | POA: Diagnosis not present

## 2015-06-27 DIAGNOSIS — Z8739 Personal history of other diseases of the musculoskeletal system and connective tissue: Secondary | ICD-10-CM | POA: Diagnosis not present

## 2015-06-27 DIAGNOSIS — Z8659 Personal history of other mental and behavioral disorders: Secondary | ICD-10-CM | POA: Insufficient documentation

## 2015-06-27 DIAGNOSIS — Z87891 Personal history of nicotine dependence: Secondary | ICD-10-CM | POA: Diagnosis not present

## 2015-06-27 DIAGNOSIS — Z79899 Other long term (current) drug therapy: Secondary | ICD-10-CM | POA: Insufficient documentation

## 2015-06-27 DIAGNOSIS — Z8742 Personal history of other diseases of the female genital tract: Secondary | ICD-10-CM | POA: Insufficient documentation

## 2015-06-27 DIAGNOSIS — Z87440 Personal history of urinary (tract) infections: Secondary | ICD-10-CM | POA: Diagnosis not present

## 2015-06-27 DIAGNOSIS — M79605 Pain in left leg: Secondary | ICD-10-CM

## 2015-06-27 DIAGNOSIS — Z87448 Personal history of other diseases of urinary system: Secondary | ICD-10-CM | POA: Diagnosis not present

## 2015-06-27 DIAGNOSIS — J45909 Unspecified asthma, uncomplicated: Secondary | ICD-10-CM | POA: Diagnosis not present

## 2015-06-27 MED ORDER — TRAMADOL HCL 50 MG PO TABS: 50.0000 mg | ORAL_TABLET | Freq: Four times a day (QID) | ORAL | Status: DC | PRN

## 2015-06-27 NOTE — Discharge Instructions (Signed)
Take the prescribed medication as directed.  May wish to try compression stockings during the day to help reduce swelling.  Continue to elevate legs at home as well. Follow-up with your orthopedist as soon as possible. Return to the ED for new or worsening symptoms.

## 2015-06-27 NOTE — ED Provider Notes (Signed)
CSN: [id number]     Arrival date & time 06/27/15  1103 History   First MD Initiated Contact with Patient 06/27/15 1138     Chief Complaint  Patient presents with  . Leg Pain     (Consider location/radiation/quality/duration/timing/severity/associated sxs/prior Treatment) Patient is a 39 y.o. female presenting with leg pain. The history is provided by the patient and medical records.  Leg Pain  This is a 39 year old female with history of scoliosis, MVP, asthma, hypertension, migraine headaches, vertigo, presenting to the ED for left leg pain. Patient states she broke her left fibula a few years ago and has had intermittent problems with pain in her left shin since this happened. She states she feels that her leg did not heal appropriately-- states she did wear brace as directed.She states in the evening her legs get swollen and she has increased pain.  Pain worse with prolonged standing and walking.  She denies numbness or weakness of left leg.  No new injuries, trauma, or falls.  Patient reports she feels an abnormal "knot" on her left shin over the past few weeks, unsure what it is.  Has been taking NSAIDs and muscle relaxers without relief.  Past Medical History  Diagnosis Date  . Panic attacks   . Scoliosis   . Mitral valve prolapse   . Asthma     As a child  . Renal disorder   . UTI (lower urinary tract infection)   . Hypertension   . Ovarian cyst   . Migraine   . Vertigo   . Degenerative disk disease    Past Surgical History  Procedure Laterality Date  . Tubal ligation    . Laparoscopy    . Cesarean section    . Cholecystectomy    . Knee surgery    . Back surgery    . Radio frequency albiation      Family History  Problem Relation Age of Onset  . Hypertension Mother   . Diabetes Mother   . Stroke Mother   . Heart disease Mother   . Hypertension Father   . Diabetes Father   . Dementia Father   . Schizophrenia Father    Social History  Substance Use Topics  .  Smoking status: Former Research scientist (life sciences)  . Smokeless tobacco: Never Used  . Alcohol Use: No   OB History    Gravida Para Term Preterm AB TAB SAB Ectopic Multiple Living   3 3 2 1      3      Review of Systems  Musculoskeletal: Positive for arthralgias.  All other systems reviewed and are negative.     Allergies  Sulfonamide derivatives; Norvasc; and Amlodipine besylate  Home Medications   Prior to Admission medications   Medication Sig Start Date End Date Taking? Authorizing Provider  amitriptyline (ELAVIL) 25 MG tablet Take 1 tablet by mouth daily. 03/19/14 03/19/15  Historical Provider, MD  clindamycin (CLEOCIN T) 1 % external solution Apply 1 application topically daily as needed. Dry 07/20/14   Historical Provider, MD  gabapentin (NEURONTIN) 300 MG capsule Take 300 mg by mouth 3 (three) times daily.    Historical Provider, MD  HYDROcodone-acetaminophen (NORCO/VICODIN) 5-325 MG per tablet Take 1 tablet by mouth every 4 (four) hours as needed. 01/06/15   Kristen N Ward, DO  ibuprofen (ADVIL,MOTRIN) 800 MG tablet Take 1 tablet (800 mg total) by mouth every 8 (eight) hours as needed for mild pain. 01/06/15   Kristen N Ward, DO  meclizine (ANTIVERT)  25 MG tablet Take 1 tablet (25 mg total) by mouth 3 (three) times daily as needed for dizziness. 10/05/13   Nat Christen, MD  metroNIDAZOLE (FLAGYL) 500 MG tablet Take 1 tablet (500 mg total) by mouth 2 (two) times daily. Patient not taking: Reported on 01/06/2015 03/22/14   Shelly Bombard, MD  nitroGLYCERIN (NITROSTAT) 0.4 MG SL tablet Place 0.4 mg under the tongue every 5 (five) minutes as needed for chest pain.    Historical Provider, MD   Pulse 93  Temp(Src) 98.2 F (36.8 C) (Oral)  Resp 16  SpO2 100%  LMP 06/19/2015 (Exact Date) Physical Exam  Constitutional: She is oriented to person, place, and time. She appears well-developed and well-nourished.  HENT:  Head: Normocephalic and atraumatic.  Mouth/Throat: Oropharynx is clear and moist.  Eyes:  Conjunctivae and EOM are normal. Pupils are equal, round, and reactive to light.  Neck: Normal range of motion. Neck supple.  Cardiovascular: Normal rate, regular rhythm and normal heart sounds.   Pulmonary/Chest: Effort normal and breath sounds normal. No respiratory distress. She has no wheezes.  Musculoskeletal: Normal range of motion.       Left lower leg: She exhibits tenderness and bony tenderness. She exhibits no swelling, no edema, no deformity and no laceration.       Legs: Mild tenderness noted to left anterior shin as depicted, no bony deformities noted; full ROM of left knee and ankle without difficulty; no calf asymmetry, tenderness, or palpable cords; no overlying skin changes or warmth to touch; leg is NVI  Neurological: She is alert and oriented to person, place, and time.  Skin: Skin is warm and dry.  Psychiatric: She has a normal mood and affect.  Nursing note and vitals reviewed.   ED Course  Procedures (including critical care time) Labs Review Labs Reviewed - No data to display  Imaging Review Dg Tibia/fibula Left  06/27/2015   CLINICAL DATA:  Left leg pain, worsening over the last few weeks. No new trauma. Remote history of fracture.  EXAM: LEFT TIBIA AND FIBULA - 2 VIEW  COMPARISON:  12/19/2006  FINDINGS: No acute bony abnormality. Specifically, no fracture, subluxation, or dislocation. Soft tissues are intact.  IMPRESSION: No acute bony abnormality.   Electronically Signed   By: Rolm Baptise M.D.   On: 06/27/2015 12:24   I have personally reviewed and evaluated these images and lab results as part of my medical decision-making.   EKG Interpretation None      MDM   Final diagnoses:  Left leg pain   39 year old female here with left lower leg pain. History of fracture several years ago, states she has concern that leg did not heal appropriately. There is no acute bony deformity or swelling today. No clinical signs of DVT. Patient describes a "knot" on her  shin, however I do not feel any palpable deformity/mass. X-ray was obtained which is negative for acute findings. Patient appears to have some dependent edema in the evenings after prolonged standing, I recommended compression stockings for this. We'll also refer her back to her orthopedist for further management. Rx tramadol.  Discussed plan with patient, he/she acknowledged understanding and agreed with plan of care.  Return precautions given for new or worsening symptoms.  Larene Pickett, PA-C 06/27/15 [place], DO 06/27/15 1510

## 2015-06-27 NOTE — ED Notes (Signed)
Pt reports she broke her left leg years ago, anterior and lateral part left leg pain from below knee to ankle pain for several weeks. Pain has increased over last few days. Reports there is knott on the bone of tibula or fibula for several weeks. Ambulatory. Pain 8/10.

## 2015-06-27 NOTE — ED Notes (Signed)
Pt to radiology.

## 2015-08-24 ENCOUNTER — Emergency Department (HOSPITAL_COMMUNITY)
Admission: EM | Admit: 2015-08-24 | Discharge: 2015-08-24 | Disposition: A | Discharge status: Home / Self Care | Attending: Emergency Medicine | Admitting: Emergency Medicine

## 2015-08-24 ENCOUNTER — Encounter (HOSPITAL_COMMUNITY): Payer: Self-pay | Admitting: *Deleted

## 2015-08-24 ENCOUNTER — Emergency Department (HOSPITAL_COMMUNITY)

## 2015-08-24 DIAGNOSIS — Z79899 Other long term (current) drug therapy: Secondary | ICD-10-CM | POA: Insufficient documentation

## 2015-08-24 DIAGNOSIS — Z8742 Personal history of other diseases of the female genital tract: Secondary | ICD-10-CM | POA: Diagnosis not present

## 2015-08-24 DIAGNOSIS — Z87448 Personal history of other diseases of urinary system: Secondary | ICD-10-CM | POA: Diagnosis not present

## 2015-08-24 DIAGNOSIS — I10 Essential (primary) hypertension: Secondary | ICD-10-CM | POA: Insufficient documentation

## 2015-08-24 DIAGNOSIS — F410 Panic disorder [episodic paroxysmal anxiety] without agoraphobia: Secondary | ICD-10-CM | POA: Insufficient documentation

## 2015-08-24 DIAGNOSIS — Z87891 Personal history of nicotine dependence: Secondary | ICD-10-CM | POA: Insufficient documentation

## 2015-08-24 DIAGNOSIS — J45909 Unspecified asthma, uncomplicated: Secondary | ICD-10-CM | POA: Insufficient documentation

## 2015-08-24 DIAGNOSIS — M546 Pain in thoracic spine: Secondary | ICD-10-CM | POA: Insufficient documentation

## 2015-08-24 DIAGNOSIS — Z87440 Personal history of urinary (tract) infections: Secondary | ICD-10-CM | POA: Diagnosis not present

## 2015-08-24 DIAGNOSIS — G43909 Migraine, unspecified, not intractable, without status migrainosus: Secondary | ICD-10-CM | POA: Insufficient documentation

## 2015-08-24 MED ORDER — CYCLOBENZAPRINE HCL 10 MG PO TABS: 10.0000 mg | ORAL_TABLET | Freq: Three times a day (TID) | ORAL | Status: DC | PRN

## 2015-08-24 NOTE — Discharge Instructions (Signed)
Please read and follow all provided instructions.  Your diagnoses today include:  1. Right-sided thoracic back pain     Tests performed today include:  Vital signs - see below for your results today  Chest x-ray-does not show any problems with the lungs underlying the area of pain.  Medications prescribed:   Flexeril (cyclobenzaprine) - muscle relaxer medication  DO NOT drive or perform any activities that require you to be awake and alert because this medicine can make you drowsy.   Take any prescribed medications only as directed.  Home care instructions:   Follow any educational materials contained in this packet  Please rest, use ice or heat on your back for the next several days  Do not lift, push, pull anything more than 10 pounds for the next week  Follow-up instructions: Please follow-up with your primary care provider/spine doctor referral in the next 1 week for further evaluation of your symptoms.   Return instructions:  SEEK IMMEDIATE MEDICAL ATTENTION IF YOU HAVE:  New numbness, tingling, weakness, or problem with the use of your arms or legs  Severe back pain not relieved with medications  Loss control of your bowels or bladder  Increasing pain in any areas of the body (such as chest or abdominal pain)  Shortness of breath, dizziness, or fainting.   Worsening nausea (feeling sick to your stomach), vomiting, fever, or sweats  Any other emergent concerns regarding your health   Additional Information:  Your vital signs today were: BP 148/96 mmHg   Pulse 69   Temp(Src) 98.2 F (36.8 C) (Oral)   SpO2 100%   LMP 08/18/2015 If your blood pressure (BP) was elevated above 135/85 this visit, please have this repeated by your doctor within one month. --------------

## 2015-08-24 NOTE — ED Notes (Signed)
Pt reports right mid back pain x2 months, reports when she rolls over in bed on that side it causes pain. Pain 8/10. Pt saw pcp a few weeks ago for this pain was prescribed pain pills and muscle relaxers, provided no relief. rn explained that in ED treatment may be similar to what pcp did and pt may have to see a specialist to get back pain resolved. Denies dysuria. Denies blood in urine.

## 2015-08-24 NOTE — ED Provider Notes (Signed)
CSN: [id number]     Arrival date & time 08/24/15  1042 History   First MD Initiated Contact with Patient 08/24/15 1135     Chief Complaint  Patient presents with  . Back Pain     (Consider location/radiation/quality/duration/timing/severity/associated sxs/prior Treatment) HPI Comments: Patient with history of scoliosis, degenerative disc disease in her lower back presents with complaint of continual mid back pain on the right side for the past 2 months. It is worse with certain movements and palpation. Pain is constant. Patient saw her primary care doctor several weeks ago and was given medication for pain and for muscle relaxation. These have not helped her. She has not had any fever or cough. She does notes decreasing exercise tolerance with extended exercise. No severe shortness of breath with daily activities. Patient denies risk factors for pulmonary embolism including: unilateral leg swelling, history of DVT/PE/other blood clots, use of estrogens, recent immobilizations, recent surgery, recent travel (>4hr segment), malignancy, hemoptysis. Patient denies warning symptoms of back pain including: fecal incontinence, urinary retention or overflow incontinence, night sweats, waking from sleep with back pain, unexplained fevers or weight loss, h/o cancer, IVDU, recent trauma.     The history is provided by the patient.    Past Medical History  Diagnosis Date  . Panic attacks   . Scoliosis   . Mitral valve prolapse   . Asthma     As a child  . Renal disorder   . UTI (lower urinary tract infection)   . Hypertension   . Ovarian cyst   . Migraine   . Vertigo   . Degenerative disk disease    Past Surgical History  Procedure Laterality Date  . Tubal ligation    . Laparoscopy    . Cesarean section    . Cholecystectomy    . Knee surgery    . Back surgery    . Radio frequency albiation      Family History  Problem Relation Age of Onset  . Hypertension Mother   . Diabetes Mother     . Stroke Mother   . Heart disease Mother   . Hypertension Father   . Diabetes Father   . Dementia Father   . Schizophrenia Father    Social History  Substance Use Topics  . Smoking status: Former Research scientist (life sciences)  . Smokeless tobacco: Never Used  . Alcohol Use: No   OB History    Gravida Para Term Preterm AB TAB SAB Ectopic Multiple Living   3 3 2 1      3      Review of Systems  Constitutional: Negative for fever and unexpected weight change.  Respiratory: Negative for cough and shortness of breath.   Cardiovascular: Negative for chest pain.  Gastrointestinal: Negative for constipation.       Negative for fecal incontinence.   Genitourinary: Negative for dysuria, hematuria, flank pain, vaginal bleeding, vaginal discharge and pelvic pain.       Negative for urinary incontinence or retention.  Musculoskeletal: Positive for back pain.  Neurological: Negative for weakness and numbness.       Denies saddle paresthesias.      Allergies  Sulfonamide derivatives; Norvasc; and Amlodipine besylate  Home Medications   Prior to Admission medications   Medication Sig Start Date End Date Taking? Authorizing Provider  amitriptyline (ELAVIL) 25 MG tablet Take 1 tablet by mouth daily. 03/19/14 03/19/15  Historical Provider, MD  BIOTIN PO Take by mouth.    Historical Provider, MD  cyclobenzaprine (  FLEXERIL) 10 MG tablet Take 10 mg by mouth 3 (three) times daily as needed for muscle spasms.    Historical Provider, MD  HYDROcodone-acetaminophen (NORCO/VICODIN) 5-325 MG per tablet Take 1 tablet by mouth every 4 (four) hours as needed. Patient not taking: Reported on 06/27/2015 01/06/15   Delice Bison Ward, DO  ibuprofen (ADVIL,MOTRIN) 800 MG tablet Take 1 tablet (800 mg total) by mouth every 8 (eight) hours as needed for mild pain. Patient not taking: Reported on 06/27/2015 01/06/15   Delice Bison Ward, DO  metroNIDAZOLE (FLAGYL) 500 MG tablet Take 1 tablet (500 mg total) by mouth 2 (two) times daily. Patient  not taking: Reported on 01/06/2015 03/22/14   Shelly Bombard, MD  Multiple Vitamin (MULTIVITAMIN WITH MINERALS) TABS tablet Take 1 tablet by mouth daily.    Historical Provider, MD  nitroGLYCERIN (NITROSTAT) 0.4 MG SL tablet Place 0.4 mg under the tongue every 5 (five) minutes as needed for chest pain.    Historical Provider, MD  traMADol (ULTRAM) 50 MG tablet Take 1 tablet (50 mg total) by mouth every 6 (six) hours as needed. 06/27/15   Larene Pickett, PA-C   BP 148/96 mmHg  Pulse 69  Temp(Src) 98.2 F (36.8 C) (Oral)  SpO2 100% Physical Exam  Constitutional: She appears well-developed and well-nourished.  HENT:  Head: Normocephalic and atraumatic.  Eyes: Conjunctivae are normal.  Neck: Normal range of motion. Neck supple.  Cardiovascular: Normal rate.   No murmur heard. Pulmonary/Chest: Effort normal. No respiratory distress. She has no wheezes. She has no rales.  Abdominal: Soft. There is no tenderness. There is no CVA tenderness.  Musculoskeletal: Normal range of motion. She exhibits tenderness.       Cervical back: She exhibits normal range of motion, no tenderness and no bony tenderness.       Thoracic back: She exhibits tenderness. She exhibits normal range of motion and no bony tenderness.       Lumbar back: She exhibits normal range of motion, no tenderness and no bony tenderness.       Back:  No step-off noted with palpation of spine.   Neurological: She is alert. She has normal strength and normal reflexes. No sensory deficit.  5/5 strength in entire lower extremities bilaterally. No sensation deficit.   Skin: Skin is warm and dry. No rash noted.  Psychiatric: She has a normal mood and affect.  Nursing note and vitals reviewed.   ED Course  Procedures (including critical care time) Labs Review Labs Reviewed - No data to display  Imaging Review Dg Chest 2 View  08/24/2015  CLINICAL DATA:  Posterior right mid rib pain for 2 months. No reported injury. EXAM: CHEST  2  VIEW COMPARISON:  06/09/2013 chest radiograph FINDINGS: Top-normal heart size, slightly increased. Stable normal mediastinal silhouette. No pneumothorax. No pleural effusion. Clear lungs, with no focal lung consolidation and no pulmonary edema. Mild dextroscoliosis of the thoracic spine, slightly increased. Surgical clips are seen in the right upper quadrant of the abdomen. IMPRESSION: Top-normal heart size, slightly increased. No pulmonary edema. Clear lungs. Electronically Signed   By: Ilona Sorrel M.D.   On: 08/24/2015 12:11   I have personally reviewed and evaluated these images and lab results as part of my medical decision-making.   EKG Interpretation None       11:46 AM Patient seen and examined. Chest x-ray performed to ensure no underlying pulmonary etiology.   Vital signs reviewed and are as follows: BP 148/96 mmHg  Pulse 69  Temp(Src) 98.2 F (36.8 C) (Oral)  SpO2 100%  12:25 PM chest x-ray negative. Patient informed. Will discharge to home with Flexeril, neurosurgery follow-up.  No red flag s/s of low back pain. Patient was counseled on back pain precautions and told to do activity as tolerated but do not lift, push, or pull heavy objects more than 10 pounds for the next week.  Patient counseled to use ice or heat on back for no longer than 15 minutes every hour.   Patient prescribed muscle relaxer and counseled on proper use of muscle relaxant medication.    Urged patient not to drink alcohol, drive, or perform any other activities that requires focus while taking either of these medications.  Patient urged to follow-up with PCP if pain does not improve with treatment and rest or if pain becomes recurrent. Urged to return with worsening severe pain, loss of bowel or bladder control, trouble walking.   The patient verbalizes understanding and agrees with the plan.   MDM   Final diagnoses:  Right-sided thoracic back pain   Patient with mid back pain that is now chronic  in nature, ongoing for 2 months. No acute injuries. No neurological or red flag signs and symptoms of back pain. She does have a history of scoliosis which may be contributing. Feel that she needs follow-up with PCP or neurosurgery for further evaluation given the chronic nature.   Carlisle Cater, PA-C 08/24/15 [place], MD 08/28/15 [phone]

## 2015-09-05 ENCOUNTER — Emergency Department (EMERGENCY_DEPARTMENT_HOSPITAL)

## 2015-09-05 ENCOUNTER — Emergency Department (HOSPITAL_COMMUNITY)
Admission: EM | Admit: 2015-09-05 | Discharge: 2015-09-05 | Disposition: A | Discharge status: Home / Self Care | Attending: Emergency Medicine | Admitting: Emergency Medicine

## 2015-09-05 ENCOUNTER — Emergency Department (HOSPITAL_COMMUNITY)

## 2015-09-05 ENCOUNTER — Encounter (HOSPITAL_COMMUNITY): Payer: Self-pay | Admitting: *Deleted

## 2015-09-05 DIAGNOSIS — G43909 Migraine, unspecified, not intractable, without status migrainosus: Secondary | ICD-10-CM | POA: Diagnosis not present

## 2015-09-05 DIAGNOSIS — M79662 Pain in left lower leg: Secondary | ICD-10-CM | POA: Insufficient documentation

## 2015-09-05 DIAGNOSIS — S8012XA Contusion of left lower leg, initial encounter: Secondary | ICD-10-CM

## 2015-09-05 DIAGNOSIS — R079 Chest pain, unspecified: Secondary | ICD-10-CM

## 2015-09-05 DIAGNOSIS — I10 Essential (primary) hypertension: Secondary | ICD-10-CM | POA: Insufficient documentation

## 2015-09-05 DIAGNOSIS — Z8742 Personal history of other diseases of the female genital tract: Secondary | ICD-10-CM | POA: Insufficient documentation

## 2015-09-05 DIAGNOSIS — J45909 Unspecified asthma, uncomplicated: Secondary | ICD-10-CM | POA: Diagnosis not present

## 2015-09-05 DIAGNOSIS — Z79899 Other long term (current) drug therapy: Secondary | ICD-10-CM | POA: Insufficient documentation

## 2015-09-05 DIAGNOSIS — M7981 Nontraumatic hematoma of soft tissue: Secondary | ICD-10-CM | POA: Diagnosis not present

## 2015-09-05 DIAGNOSIS — F410 Panic disorder [episodic paroxysmal anxiety] without agoraphobia: Secondary | ICD-10-CM | POA: Insufficient documentation

## 2015-09-05 DIAGNOSIS — M79606 Pain in leg, unspecified: Secondary | ICD-10-CM

## 2015-09-05 DIAGNOSIS — Z3202 Encounter for pregnancy test, result negative: Secondary | ICD-10-CM | POA: Diagnosis not present

## 2015-09-05 DIAGNOSIS — R0602 Shortness of breath: Secondary | ICD-10-CM | POA: Diagnosis not present

## 2015-09-05 DIAGNOSIS — Z87891 Personal history of nicotine dependence: Secondary | ICD-10-CM | POA: Diagnosis not present

## 2015-09-05 DIAGNOSIS — Z7982 Long term (current) use of aspirin: Secondary | ICD-10-CM | POA: Insufficient documentation

## 2015-09-05 DIAGNOSIS — Z87440 Personal history of urinary (tract) infections: Secondary | ICD-10-CM | POA: Diagnosis not present

## 2015-09-05 LAB — BASIC METABOLIC PANEL
Anion gap: 7 (ref 5–15)
BUN: 11 mg/dL (ref 6–20)
CO2: 26 mmol/L (ref 22–32)
CREATININE: 0.62 mg/dL (ref 0.44–1.00)
Calcium: 9.2 mg/dL (ref 8.9–10.3)
Chloride: 105 mmol/L (ref 101–111)
GFR calc Af Amer: >60 mL/min (ref >60)
GFR calc non Af Amer: >60 mL/min (ref >60)
Glucose, Bld: 79 mg/dL (ref 65–99)
Potassium: 3.5 mmol/L (ref 3.5–5.1)
SODIUM: 138 mmol/L (ref 135–145)

## 2015-09-05 LAB — CBC
HCT: 35.8 % — ABNORMAL LOW (ref 36.0–46.0)
Hemoglobin: 11.6 g/dL — ABNORMAL LOW (ref 12.0–15.0)
MCH: 27 pg (ref 26.0–34.0)
MCHC: 32.4 g/dL (ref 30.0–36.0)
MCV: 83.4 fL (ref 78.0–100.0)
PLATELETS: 398 10*3/uL (ref 150–400)
RBC: 4.29 MIL/uL (ref 3.87–5.11)
RDW: 14.5 % (ref 11.5–15.5)
WBC: 6.3 10*3/uL (ref 4.0–10.5)

## 2015-09-05 LAB — I-STAT TROPONIN, ED
Troponin i, poc: 0 ng/mL (ref 0.00–0.08)
Troponin i, poc: 0 ng/mL (ref 0.00–0.08)

## 2015-09-05 LAB — BRAIN NATRIURETIC PEPTIDE: B NATRIURETIC PEPTIDE 5: 10.2 pg/mL (ref 0.0–100.0)

## 2015-09-05 LAB — I-STAT BETA HCG BLOOD, ED (MC, WL, AP ONLY): I-stat hCG, quantitative: <5 m[IU]/mL (ref <5)

## 2015-09-05 MED ORDER — ASPIRIN 81 MG PO CHEW
324.0000 mg | CHEWABLE_TABLET | Freq: Once | ORAL | Status: AC
  Administered 2015-09-05: 324 mg via ORAL
  Filled 2015-09-05: qty 4

## 2015-09-05 MED ORDER — IOHEXOL 350 MG/ML SOLN
100.0000 mL | Freq: Once | INTRAVENOUS | Status: AC | PRN
  Administered 2015-09-05: 100 mL via INTRAVENOUS

## 2015-09-05 MED ORDER — MORPHINE SULFATE (PF) 4 MG/ML IV SOLN
4.0000 mg | Freq: Once | INTRAVENOUS | Status: AC
  Administered 2015-09-05: 4 mg via INTRAVENOUS
  Filled 2015-09-05: qty 1

## 2015-09-05 MED ORDER — IBUPROFEN 200 MG PO TABS: 400.0000 mg | ORAL_TABLET | Freq: Four times a day (QID) | ORAL | Status: DC | PRN

## 2015-09-05 NOTE — ED Notes (Signed)
Pt reports left calf pain and chest pain x3 days, noticeable bruise to left calf area starting yesterday. Slight SOB starting this morning. Pain 9/10.

## 2015-09-05 NOTE — Progress Notes (Signed)
VASCULAR LAB PRELIMINARY  PRELIMINARY  PRELIMINARY  PRELIMINARY  Left lower extremity venous duplex completed.    Preliminary report:  Left:  No evidence of DVT, superficial thrombosis, or Baker's cyst.  [name], RVT 09/05/2015, 2:13 PM

## 2015-09-05 NOTE — ED Notes (Signed)
Bed: WA19 Expected date:  Expected time:  Means of arrival:  Comments: 

## 2015-09-05 NOTE — Discharge Instructions (Signed)
Contusion  The area on your leg appears to be a bruise or contusion.  Keep your leg elevated and apply ice to the area.  A contusion is a deep bruise. Contusions are the result of a blunt injury to tissues and muscle fibers under the skin. The injury causes bleeding under the skin. The skin overlying the contusion may turn blue, purple, or yellow. Minor injuries will give you a painless contusion, but more severe contusions may stay painful and swollen for a few weeks.  CAUSES  This condition is usually caused by a blow, trauma, or direct force to an area of the body. SYMPTOMS  Symptoms of this condition include:  Swelling of the injured area.  Pain and tenderness in the injured area.  Discoloration. The area may have redness and then turn blue, purple, or yellow. DIAGNOSIS  This condition is diagnosed based on a physical exam and medical history. An X-ray, CT scan, or MRI may be needed to determine if there are any associated injuries, such as broken bones (fractures). TREATMENT  Specific treatment for this condition depends on what area of the body was injured. In general, the best treatment for a contusion is resting, icing, applying pressure to (compression), and elevating the injured area. This is often called the RICE strategy. Over-the-counter anti-inflammatory medicines may also be recommended for pain control.  HOME CARE INSTRUCTIONS   Rest the injured area.  If directed, apply ice to the injured area:  Put ice in a plastic bag.  Place a towel between your skin and the bag.  Leave the ice on for 20 minutes, 2-3 times per day.  If directed, apply light compression to the injured area using an elastic bandage. Make sure the bandage is not wrapped too tightly. Remove and reapply the bandage as directed by your health care provider.  If possible, raise (elevate) the injured area above the level of your heart while you are sitting or lying down.  Take over-the-counter and  prescription medicines only as told by your health care provider. SEEK MEDICAL CARE IF:  Your symptoms do not improve after several days of treatment.  Your symptoms get worse.  You have difficulty moving the injured area. SEEK IMMEDIATE MEDICAL CARE IF:   You have severe pain.  You have numbness in a hand or foot.  Your hand or foot turns pale or cold.   This information is not intended to replace advice given to you by your health care provider. Make sure you discuss any questions you have with your health care provider.   Document Released: 08/01/2005 Document Revised: 07/13/2015 Document Reviewed: 03/09/2015 Elsevier Interactive Patient Education 2016 [employer]    Nonspecific Chest Pain   The exact cause of your chest pain is not clear.  You should follow up outpatient with your primary care physician for reevaluation and further work up as needed Chest pain can be caused by many different conditions. There is always a chance that your pain could be related to something serious, such as a heart attack or a blood clot in your lungs. Chest pain can also be caused by conditions that are not life-threatening. If you have chest pain, it is very important to follow up with your health care provider. CAUSES  Chest pain can be caused by:  Heartburn.  Pneumonia or bronchitis.  Anxiety or stress.  Inflammation around your heart (pericarditis) or lung (pleuritis or pleurisy).  A blood clot in your lung.  A collapsed lung (pneumothorax). It  can develop suddenly on its own (spontaneous pneumothorax) or from trauma to the chest.  Shingles infection (varicella-zoster virus).  Heart attack.  Damage to the bones, muscles, and cartilage that make up your chest wall. This can include:  Bruised bones due to injury.  Strained muscles or cartilage due to frequent or repeated coughing or overwork.  Fracture to one or more ribs.  Sore cartilage due to inflammation  (costochondritis). RISK FACTORS  Risk factors for chest pain may include:  Activities that increase your risk for trauma or injury to your chest.  Respiratory infections or conditions that cause frequent coughing.  Medical conditions or overeating that can cause heartburn.  Heart disease or family history of heart disease.  Conditions or health behaviors that increase your risk of developing a blood clot.  Having had chicken pox (varicella zoster). SIGNS AND SYMPTOMS Chest pain can feel like:  Burning or tingling on the surface of your chest or deep in your chest.  Crushing, pressure, aching, or squeezing pain.  Dull or sharp pain that is worse when you move, cough, or take a deep breath.  Pain that is also felt in your back, neck, shoulder, or arm, or pain that spreads to any of these areas. Your chest pain may come and go, or it may stay constant. DIAGNOSIS Lab tests or other studies may be needed to find the cause of your pain. Your health care provider may have you take a test called an ambulatory ECG (electrocardiogram). An ECG records your heartbeat patterns at the time the test is performed. You may also have other tests, such as:  Transthoracic echocardiogram (TTE). During echocardiography, sound waves are used to create a picture of all of the heart structures and to look at how blood flows through your heart.  Transesophageal echocardiogram (TEE).This is a more advanced imaging test that obtains images from inside your body. It allows your health care provider to see your heart in finer detail.  Cardiac monitoring. This allows your health care provider to monitor your heart rate and rhythm in real time.  Holter monitor. This is a portable device that records your heartbeat and can help to diagnose abnormal heartbeats. It allows your health care provider to track your heart activity for several days, if needed.  Stress tests. These can be done through exercise or by  taking medicine that makes your heart beat more quickly.  Blood tests.  Imaging tests. TREATMENT  Your treatment depends on what is causing your chest pain. Treatment may include:  Medicines. These may include:  Acid blockers for heartburn.  Anti-inflammatory medicine.  Pain medicine for inflammatory conditions.  Antibiotic medicine, if an infection is present.  Medicines to dissolve blood clots.  Medicines to treat coronary artery disease.  Supportive care for conditions that do not require medicines. This may include:  Resting.  Applying heat or cold packs to injured areas.  Limiting activities until pain decreases. HOME CARE INSTRUCTIONS  If you were prescribed an antibiotic medicine, finish it all even if you start to feel better.  Avoid any activities that bring on chest pain.  Do not use any tobacco products, including cigarettes, chewing tobacco, or electronic cigarettes. If you need help quitting, ask your health care provider.  Do not drink alcohol.  Take medicines only as directed by your health care provider.  Keep all follow-up visits as directed by your health care provider. This is important. This includes any further testing if your chest pain does not go  away.  If heartburn is the cause for your chest pain, you may be told to keep your head raised (elevated) while sleeping. This reduces the chance that acid will go from your stomach into your esophagus.  Make lifestyle changes as directed by your health care provider. These may include:  Getting regular exercise. Ask your health care provider to suggest some activities that are safe for you.  Eating a heart-healthy diet. A registered dietitian can help you to learn healthy eating options.  Maintaining a healthy weight.  Managing diabetes, if necessary.  Reducing stress. SEEK MEDICAL CARE IF:  Your chest pain does not go away after treatment.  You have a rash with blisters on your  chest.  You have a fever. SEEK IMMEDIATE MEDICAL CARE IF:   Your chest pain is worse.  You have an increasing cough, or you cough up blood.  You have severe abdominal pain.  You have severe weakness.  You faint.  You have chills.  You have sudden, unexplained chest discomfort.  You have sudden, unexplained discomfort in your arms, back, neck, or jaw.  You have shortness of breath at any time.  You suddenly start to sweat, or your skin gets clammy.  You feel nauseous or you vomit.  You suddenly feel light-headed or dizzy.  Your heart begins to beat quickly, or it feels like it is skipping beats. These symptoms may represent a serious problem that is an emergency. Do not wait to see if the symptoms will go away. Get medical help right away. Call your local emergency services (911 in the [place] Do not drive yourself to the hospital.   This information is not intended to replace advice given to you by your health care provider. Make sure you discuss any questions you have with your health care provider.   Document Released: 08/01/2005 Document Revised: 11/12/2014 Document Reviewed: 05/28/2014 Elsevier Interactive Patient Education [employer].

## 2015-09-07 NOTE — ED Provider Notes (Signed)
CSN: [id number]     Arrival date & time 09/05/15  1036 History   First MD Initiated Contact with Patient 09/05/15 1131     Chief Complaint  Patient presents with  . Chest Pain  . Calf Pain and Bruising      (Consider location/radiation/quality/duration/timing/severity/associated sxs/prior Treatment) HPI Comments: 39 y.o. Female with history of scoliosis, MVP, HTN, asthma, panic attacks presents for calf pain and chest pain.  The patient reports that she developed pain and swelling in her left calf over the last 3 days but today noted a bruise over the calf.  She denies known trauma to the calf.  She also reports increasing chest pain in her left chest over the last 3 days.  No fever, cough.  She does reports ome shortness of breath that started today.  Patient is not on hormone replacement or estrogen for any reason.  No recent surgery or immobility.  Patient is a 39 y.o. female presenting with chest pain.  Chest Pain Associated symptoms: shortness of breath   Associated symptoms: no abdominal pain, no back pain, no cough, no diaphoresis, no dizziness, no fatigue, no fever, no headache, no nausea, no palpitations and no weakness     Past Medical History  Diagnosis Date  . Panic attacks   . Scoliosis   . Mitral valve prolapse   . Asthma     As a child  . Renal disorder   . UTI (lower urinary tract infection)   . Hypertension   . Ovarian cyst   . Migraine   . Vertigo   . Degenerative disk disease    Past Surgical History  Procedure Laterality Date  . Tubal ligation    . Laparoscopy    . Cesarean section    . Cholecystectomy    . Knee surgery    . Back surgery    . Radio frequency albiation      Family History  Problem Relation Age of Onset  . Hypertension Mother   . Diabetes Mother   . Stroke Mother   . Heart disease Mother   . Hypertension Father   . Diabetes Father   . Dementia Father   . Schizophrenia Father    Social History  Substance Use Topics  . Smoking  status: Former Research scientist (life sciences)  . Smokeless tobacco: Never Used  . Alcohol Use: No   OB History    Gravida Para Term Preterm AB TAB SAB Ectopic Multiple Living   3 3 2 1      3      Review of Systems  Constitutional: Negative for fever, chills, diaphoresis and fatigue.  HENT: Negative for congestion, postnasal drip and sinus pressure.   Eyes: Negative for pain and redness.  Respiratory: Positive for shortness of breath. Negative for cough, chest tightness and wheezing.   Cardiovascular: Positive for chest pain and leg swelling (left). Negative for palpitations.  Gastrointestinal: Negative for nausea, abdominal pain, diarrhea and constipation.  Genitourinary: Negative for dysuria, urgency and frequency.  Musculoskeletal: Negative for myalgias and back pain.  Neurological: Negative for dizziness, weakness, light-headedness and headaches.  Hematological: Does not bruise/bleed easily.      Allergies  Sulfonamide derivatives; Ciprofloxacin; Norvasc; and Amlodipine besylate  Home Medications   Prior to Admission medications   Medication Sig Start Date End Date Taking? Authorizing Provider  aspirin 81 MG chewable tablet Chew 81 mg by mouth daily.   Yes Historical Provider, MD  BIOTIN PO Take by mouth.   Yes Historical Provider,  MD  cyclobenzaprine (FLEXERIL) 10 MG tablet Take 1 tablet (10 mg total) by mouth 3 (three) times daily as needed for muscle spasms. 08/24/15  Yes Carlisle Cater, PA-C  Multiple Vitamin (MULTIVITAMIN WITH MINERALS) TABS tablet Take 1 tablet by mouth daily.   Yes Historical Provider, MD  nitroGLYCERIN (NITROSTAT) 0.4 MG SL tablet Place 0.4 mg under the tongue every 5 (five) minutes as needed for chest pain.   Yes Historical Provider, MD  Omega-3 Fatty Acids (FISH OIL) 1000 MG CAPS Take 1 capsule by mouth daily.   Yes Historical Provider, MD  OVER THE COUNTER MEDICATION Take 2 tablets by mouth 4 (four) times daily as needed (focus). "Focus Fast" or "Fast Focus" otc herbal  supplement   Yes Historical Provider, MD  traMADol (ULTRAM) 50 MG tablet Take 1 tablet (50 mg total) by mouth every 6 (six) hours as needed. Patient taking differently: Take 50 mg by mouth every 6 (six) hours as needed for moderate pain.  06/27/15  Yes Larene Pickett, PA-C  amitriptyline (ELAVIL) 25 MG tablet Take 1 tablet by mouth daily. 03/19/14 03/19/15  Historical Provider, MD  ibuprofen (ADVIL,MOTRIN) 200 MG tablet Take 2 tablets (400 mg total) by mouth every 6 (six) hours as needed for headache or moderate pain. 09/05/15   Harvel Quale, MD   BP 110/79 mmHg  Pulse 66  Temp(Src) 98.4 F (36.9 C) (Oral)  Resp 18  SpO2 100%  LMP 08/18/2015 (Exact Date) Physical Exam  Constitutional: She is oriented to person, place, and time. She appears well-developed and well-nourished. No distress.  HENT:  Head: Normocephalic and atraumatic.  Right Ear: External ear normal.  Left Ear: External ear normal.  Nose: Nose normal.  Mouth/Throat: Oropharynx is clear and moist. No oropharyngeal exudate.  Eyes: EOM are normal. Pupils are equal, round, and reactive to light.  Neck: Normal range of motion. Neck supple.  Cardiovascular: Normal rate, regular rhythm, normal heart sounds and intact distal pulses.   No murmur heard. Pulmonary/Chest: Effort normal. No respiratory distress. She has no wheezes. She has no rales.  Abdominal: Soft. She exhibits no distension. There is no tenderness.  Musculoskeletal: Normal range of motion. She exhibits edema (1+ edema with overlying bruise and tenderness of the left calf). She exhibits no tenderness.  Neurological: She is alert and oriented to person, place, and time.  Skin: Skin is warm and dry. No rash noted. She is not diaphoretic.  Vitals reviewed.   ED Course  Procedures (including critical care time) Labs Review Labs Reviewed  CBC - Abnormal; Notable for the following:    Hemoglobin 11.6 (*)    HCT 35.8 (*)    All other components within normal limits   BASIC METABOLIC PANEL  BRAIN NATRIURETIC PEPTIDE  I-STAT TROPOININ, ED  I-STAT BETA HCG BLOOD, ED ([place], [facility], [facility] ONLY)  I-STAT TROPOININ, ED    Imaging Review Dg Chest 2 View  09/05/2015  CLINICAL DATA:  New onset mid to LEFT chest pain with shortness of breath this morning, history asthma EXAM: CHEST  2 VIEW COMPARISON:  08/24/2015 FINDINGS: Normal heart size, mediastinal contours, and pulmonary vascularity. Lungs clear. No pleural effusion or pneumothorax. Biconvex thoracolumbar scoliosis. IMPRESSION: No acute abnormalities. Electronically Signed   By: Lavonia Dana M.D.   On: 09/05/2015 11:19   Ct Angio Chest Pe W/cm &/or Wo Cm  09/05/2015  CLINICAL DATA:  Chest and left calf pain for the past 3 days. Left calf bruising since yesterday. Some shortness of  breath this morning. EXAM: CT ANGIOGRAPHY CHEST WITH CONTRAST TECHNIQUE: Multidetector CT imaging of the chest was performed using the standard protocol during bolus administration of intravenous contrast. Multiplanar CT image reconstructions and MIPs were obtained to evaluate the vascular anatomy. CONTRAST:  157mL OMNIPAQUE IOHEXOL 350 MG/ML SOLN COMPARISON:  Chest radiographs obtained earlier today. Chest CTA dated 08/30/2011. FINDINGS: Normally opacified pulmonary arteries with no pulmonary arterial filling defects seen. Minimal bilateral dependent atelectasis. Otherwise, clear lungs. No lung nodules or enlarged lymph nodes. Mild thoracic spine degenerative changes. Moderate dextroconvex thoracic scoliosis is stable. Unremarkable upper abdomen. Review of the MIP images confirms the above findings. IMPRESSION: No acute abnormality.  No pulmonary emboli seen. Electronically Signed   By: Claudie Revering M.D.   On: 09/05/2015 14:47   I have personally reviewed and evaluated these images and lab results as part of my medical decision-making.   EKG Interpretation   Date/Time:  Monday September 05 2015 10:44:02 EDT Ventricular Rate:  76 PR Interval:   154 QRS Duration: 99 QT Interval:  380 QTC Calculation: 427 R Axis:   60 Text Interpretation:  Sinus rhythm Baseline wander in lead(s) II III aVF  No significant change since last tracing Confirmed by Viva Gallaher  ([id number]) on 09/05/2015 3:12:05 PM      MDM  Patient seen and evaluated in stable condition. EKG unremarkable.  Troponin normal x2.  CTA negative for PE, no DVT seen on doppler of left leg.  Patient well appearing and in no acute distress.  Discussed results with patient who expressed understanding and agreement with plan for discharge and outpatient follow up.  Patient was discharged home in stable condition. Final diagnoses:  Leg pain  Chest pain, unspecified chest pain type  Contusion of leg, left, initial encounter    1. Chest pain  2. Leg contusion    Harvel Quale, MD 09/07/15 [phone]

## 2015-09-15 ENCOUNTER — Encounter (HOSPITAL_COMMUNITY): Payer: Self-pay

## 2015-09-15 ENCOUNTER — Emergency Department (HOSPITAL_COMMUNITY)

## 2015-09-15 ENCOUNTER — Inpatient Hospital Stay (HOSPITAL_COMMUNITY)
Admission: EM | Admit: 2015-09-15 | Discharge: 2015-09-20 | DRG: 872 | Disposition: A | Discharge status: Home / Self Care | Attending: Family Medicine | Admitting: Family Medicine

## 2015-09-15 DIAGNOSIS — R509 Fever, unspecified: Secondary | ICD-10-CM | POA: Diagnosis present

## 2015-09-15 DIAGNOSIS — E876 Hypokalemia: Secondary | ICD-10-CM | POA: Diagnosis not present

## 2015-09-15 DIAGNOSIS — Z818 Family history of other mental and behavioral disorders: Secondary | ICD-10-CM | POA: Diagnosis not present

## 2015-09-15 DIAGNOSIS — A419 Sepsis, unspecified organism: Secondary | ICD-10-CM

## 2015-09-15 DIAGNOSIS — I10 Essential (primary) hypertension: Secondary | ICD-10-CM | POA: Diagnosis not present

## 2015-09-15 DIAGNOSIS — Z888 Allergy status to other drugs, medicaments and biological substances status: Secondary | ICD-10-CM

## 2015-09-15 DIAGNOSIS — M436 Torticollis: Secondary | ICD-10-CM | POA: Diagnosis present

## 2015-09-15 DIAGNOSIS — N3001 Acute cystitis with hematuria: Secondary | ICD-10-CM

## 2015-09-15 DIAGNOSIS — F319 Bipolar disorder, unspecified: Secondary | ICD-10-CM | POA: Diagnosis not present

## 2015-09-15 DIAGNOSIS — A413 Sepsis due to Hemophilus influenzae: Secondary | ICD-10-CM

## 2015-09-15 DIAGNOSIS — G43909 Migraine, unspecified, not intractable, without status migrainosus: Secondary | ICD-10-CM | POA: Diagnosis present

## 2015-09-15 DIAGNOSIS — Z7982 Long term (current) use of aspirin: Secondary | ICD-10-CM | POA: Diagnosis not present

## 2015-09-15 DIAGNOSIS — Z6834 Body mass index (BMI) 34.0-34.9, adult: Secondary | ICD-10-CM | POA: Diagnosis not present

## 2015-09-15 DIAGNOSIS — N12 Tubulo-interstitial nephritis, not specified as acute or chronic: Secondary | ICD-10-CM | POA: Diagnosis not present

## 2015-09-15 DIAGNOSIS — Z823 Family history of stroke: Secondary | ICD-10-CM

## 2015-09-15 DIAGNOSIS — Z791 Long term (current) use of non-steroidal anti-inflammatories (NSAID): Secondary | ICD-10-CM | POA: Diagnosis not present

## 2015-09-15 DIAGNOSIS — Z8249 Family history of ischemic heart disease and other diseases of the circulatory system: Secondary | ICD-10-CM

## 2015-09-15 DIAGNOSIS — Z833 Family history of diabetes mellitus: Secondary | ICD-10-CM | POA: Diagnosis not present

## 2015-09-15 DIAGNOSIS — Z882 Allergy status to sulfonamides status: Secondary | ICD-10-CM | POA: Diagnosis not present

## 2015-09-15 DIAGNOSIS — J45909 Unspecified asthma, uncomplicated: Secondary | ICD-10-CM

## 2015-09-15 DIAGNOSIS — I058 Other rheumatic mitral valve diseases: Secondary | ICD-10-CM | POA: Diagnosis present

## 2015-09-15 DIAGNOSIS — R112 Nausea with vomiting, unspecified: Secondary | ICD-10-CM

## 2015-09-15 DIAGNOSIS — Z79899 Other long term (current) drug therapy: Secondary | ICD-10-CM | POA: Diagnosis not present

## 2015-09-15 DIAGNOSIS — A4151 Sepsis due to Escherichia coli [E. coli]: Principal | ICD-10-CM | POA: Diagnosis present

## 2015-09-15 DIAGNOSIS — Z9049 Acquired absence of other specified parts of digestive tract: Secondary | ICD-10-CM

## 2015-09-15 DIAGNOSIS — M419 Scoliosis, unspecified: Secondary | ICD-10-CM | POA: Diagnosis present

## 2015-09-15 DIAGNOSIS — E872 Acidosis: Secondary | ICD-10-CM | POA: Diagnosis not present

## 2015-09-15 DIAGNOSIS — Z87440 Personal history of urinary (tract) infections: Secondary | ICD-10-CM | POA: Diagnosis not present

## 2015-09-15 DIAGNOSIS — Z881 Allergy status to other antibiotic agents status: Secondary | ICD-10-CM

## 2015-09-15 DIAGNOSIS — I059 Rheumatic mitral valve disease, unspecified: Secondary | ICD-10-CM | POA: Diagnosis present

## 2015-09-15 DIAGNOSIS — N289 Disorder of kidney and ureter, unspecified: Secondary | ICD-10-CM

## 2015-09-15 DIAGNOSIS — F410 Panic disorder [episodic paroxysmal anxiety] without agoraphobia: Secondary | ICD-10-CM | POA: Diagnosis present

## 2015-09-15 LAB — COMPREHENSIVE METABOLIC PANEL
ALT: 80 U/L — ABNORMAL HIGH (ref 14–54)
ANION GAP: 8 (ref 5–15)
AST: 61 U/L — AB (ref 15–41)
Albumin: 3 g/dL — ABNORMAL LOW (ref 3.5–5.0)
Alkaline Phosphatase: 58 U/L (ref 38–126)
BUN: 8 mg/dL (ref 6–20)
CHLORIDE: 104 mmol/L (ref 101–111)
CO2: 21 mmol/L — ABNORMAL LOW (ref 22–32)
Calcium: 8.2 mg/dL — ABNORMAL LOW (ref 8.9–10.3)
Creatinine, Ser: 0.64 mg/dL (ref 0.44–1.00)
GFR calc Af Amer: >60 mL/min (ref >60)
Glucose, Bld: 131 mg/dL — ABNORMAL HIGH (ref 65–99)
POTASSIUM: 3.3 mmol/L — AB (ref 3.5–5.1)
Sodium: 133 mmol/L — ABNORMAL LOW (ref 135–145)
Total Bilirubin: 0.7 mg/dL (ref 0.3–1.2)
Total Protein: 6.5 g/dL (ref 6.5–8.1)

## 2015-09-15 LAB — CBC WITH DIFFERENTIAL/PLATELET
BASOS ABS: 0 10*3/uL (ref 0.0–0.1)
Basophils Relative: 0 %
EOS PCT: 0 %
Eosinophils Absolute: 0 10*3/uL (ref 0.0–0.7)
HEMATOCRIT: 31.2 % — AB (ref 36.0–46.0)
HEMOGLOBIN: 10.5 g/dL — AB (ref 12.0–15.0)
LYMPHS ABS: 1 10*3/uL (ref 0.7–4.0)
LYMPHS PCT: 5 %
MCH: 27.6 pg (ref 26.0–34.0)
MCHC: 33.7 g/dL (ref 30.0–36.0)
MCV: 82.1 fL (ref 78.0–100.0)
Monocytes Absolute: 3.2 10*3/uL — ABNORMAL HIGH (ref 0.1–1.0)
Monocytes Relative: 18 %
NEUTROS ABS: 13.3 10*3/uL — AB (ref 1.7–7.7)
NEUTROS PCT: 77 %
Platelets: 328 10*3/uL (ref 150–400)
RBC: 3.8 MIL/uL — AB (ref 3.87–5.11)
RDW: 14.8 % (ref 11.5–15.5)
WBC: 17.5 10*3/uL — AB (ref 4.0–10.5)

## 2015-09-15 LAB — URINE MICROSCOPIC-ADD ON

## 2015-09-15 LAB — INFLUENZA PANEL BY PCR (TYPE A & B)
H1N1FLUPCR: NOT DETECTED
INFLBPCR: NEGATIVE
Influenza A By PCR: NEGATIVE

## 2015-09-15 LAB — I-STAT BETA HCG BLOOD, ED (MC, WL, AP ONLY): I-stat hCG, quantitative: 5.1 m[IU]/mL — ABNORMAL HIGH (ref <5)

## 2015-09-15 LAB — URINALYSIS, ROUTINE W REFLEX MICROSCOPIC
Bilirubin Urine: NEGATIVE
GLUCOSE, UA: NEGATIVE mg/dL
Ketones, ur: NEGATIVE mg/dL
NITRITE: POSITIVE — AB
PH: 7 (ref 5.0–8.0)
Protein, ur: 30 mg/dL — AB
SPECIFIC GRAVITY, URINE: 1.009 (ref 1.005–1.030)
Urobilinogen, UA: 1 mg/dL (ref 0.0–1.0)

## 2015-09-15 LAB — I-STAT CG4 LACTIC ACID, ED
LACTIC ACID, VENOUS: 3.25 mmol/L — AB (ref 0.5–2.0)
LACTIC ACID, VENOUS: 0.74 mmol/L (ref 0.5–2.0)

## 2015-09-15 MED ORDER — VANCOMYCIN HCL IN DEXTROSE 1-5 GM/200ML-% IV SOLN
1000.0000 mg | Freq: Once | INTRAVENOUS | Status: AC
  Administered 2015-09-15: 1000 mg via INTRAVENOUS
  Filled 2015-09-15: qty 200

## 2015-09-15 MED ORDER — ACETAMINOPHEN 325 MG PO TABS
650.0000 mg | ORAL_TABLET | Freq: Four times a day (QID) | ORAL | Status: DC | PRN
  Administered 2015-09-15 – 2015-09-16 (×4): 650 mg via ORAL
  Filled 2015-09-15 (×4): qty 2

## 2015-09-15 MED ORDER — IBUPROFEN 600 MG PO TABS
600.0000 mg | ORAL_TABLET | Freq: Once | ORAL | Status: AC
  Administered 2015-09-15: 600 mg via ORAL
  Filled 2015-09-15: qty 3
  Filled 2015-09-15: qty 1

## 2015-09-15 MED ORDER — CETYLPYRIDINIUM CHLORIDE 0.05 % MT LIQD
7.0000 mL | Freq: Two times a day (BID) | OROMUCOSAL | Status: DC
  Administered 2015-09-15 – 2015-09-18 (×6): 7 mL via OROMUCOSAL

## 2015-09-15 MED ORDER — DEXTROSE 5 % IV SOLN: 1.0000 g | Freq: Once | INTRAVENOUS | Status: DC

## 2015-09-15 MED ORDER — IBUPROFEN 800 MG PO TABS
800.0000 mg | ORAL_TABLET | Freq: Once | ORAL | Status: DC
  Filled 2015-09-15: qty 1

## 2015-09-15 MED ORDER — ENOXAPARIN SODIUM 40 MG/0.4ML ~~LOC~~ SOLN
40.0000 mg | SUBCUTANEOUS | Status: DC
  Administered 2015-09-15 – 2015-09-19 (×5): 40 mg via SUBCUTANEOUS
  Filled 2015-09-15 (×6): qty 0.4

## 2015-09-15 MED ORDER — SODIUM CHLORIDE 0.9 % IV SOLN
INTRAVENOUS | Status: DC
  Administered 2015-09-15 (×2): via INTRAVENOUS
  Administered 2015-09-16: 125 mL/h via INTRAVENOUS

## 2015-09-15 MED ORDER — PIPERACILLIN-TAZOBACTAM 3.375 G IVPB
3.3750 g | Freq: Three times a day (TID) | INTRAVENOUS | Status: DC
  Administered 2015-09-15 – 2015-09-16 (×5): 3.375 g via INTRAVENOUS
  Filled 2015-09-15 (×7): qty 50

## 2015-09-15 MED ORDER — KETOROLAC TROMETHAMINE 30 MG/ML IJ SOLN
30.0000 mg | Freq: Once | INTRAMUSCULAR | Status: AC
  Administered 2015-09-15: 30 mg via INTRAVENOUS
  Filled 2015-09-15: qty 1

## 2015-09-15 MED ORDER — CHLORHEXIDINE GLUCONATE 0.12 % MT SOLN
15.0000 mL | Freq: Two times a day (BID) | OROMUCOSAL | Status: DC
  Administered 2015-09-15 – 2015-09-19 (×6): 15 mL via OROMUCOSAL
  Filled 2015-09-15 (×12): qty 15

## 2015-09-15 MED ORDER — DIPHENHYDRAMINE HCL 50 MG/ML IJ SOLN
25.0000 mg | Freq: Once | INTRAMUSCULAR | Status: AC
  Administered 2015-09-15: 25 mg via INTRAVENOUS
  Filled 2015-09-15: qty 1

## 2015-09-15 MED ORDER — SODIUM CHLORIDE 0.9 % IV SOLN
INTRAVENOUS | Status: DC
  Administered 2015-09-16: 12:00:00 via INTRAVENOUS

## 2015-09-15 MED ORDER — TRAMADOL HCL 50 MG PO TABS
50.0000 mg | ORAL_TABLET | Freq: Four times a day (QID) | ORAL | Status: DC | PRN
  Administered 2015-09-15 – 2015-09-19 (×8): 50 mg via ORAL
  Filled 2015-09-15 (×8): qty 1

## 2015-09-15 MED ORDER — CYCLOBENZAPRINE HCL 10 MG PO TABS
10.0000 mg | ORAL_TABLET | Freq: Three times a day (TID) | ORAL | Status: DC | PRN
  Administered 2015-09-17: 10 mg via ORAL
  Filled 2015-09-15: qty 1

## 2015-09-15 MED ORDER — ASPIRIN 81 MG PO CHEW
81.0000 mg | CHEWABLE_TABLET | Freq: Every day | ORAL | Status: DC
  Administered 2015-09-15 – 2015-09-16 (×2): 81 mg via ORAL
  Filled 2015-09-15 (×3): qty 1

## 2015-09-15 MED ORDER — SODIUM CHLORIDE 0.9 % IV BOLUS (SEPSIS)
1000.0000 mL | Freq: Once | INTRAVENOUS | Status: AC
  Administered 2015-09-15: 1000 mL via INTRAVENOUS

## 2015-09-15 MED ORDER — METOCLOPRAMIDE HCL 5 MG/ML IJ SOLN
10.0000 mg | Freq: Once | INTRAMUSCULAR | Status: AC
  Administered 2015-09-15: 10 mg via INTRAVENOUS
  Filled 2015-09-15: qty 2

## 2015-09-15 MED ORDER — PIPERACILLIN-TAZOBACTAM 3.375 G IVPB
3.3750 g | Freq: Once | INTRAVENOUS | Status: AC
Start: 2015-09-15 — End: 2015-09-15
  Administered 2015-09-15: 3.375 g via INTRAVENOUS
  Filled 2015-09-15: qty 50

## 2015-09-15 MED ORDER — ACETAMINOPHEN 650 MG RE SUPP: 650.0000 mg | Freq: Four times a day (QID) | RECTAL | Status: DC | PRN

## 2015-09-15 MED ORDER — ONDANSETRON HCL 4 MG/2ML IJ SOLN
4.0000 mg | Freq: Once | INTRAMUSCULAR | Status: AC
  Administered 2015-09-15: 4 mg via INTRAVENOUS
  Filled 2015-09-15: qty 2

## 2015-09-15 MED ORDER — AMITRIPTYLINE HCL 25 MG PO TABS
25.0000 mg | ORAL_TABLET | Freq: Every day | ORAL | Status: DC
  Administered 2015-09-15 – 2015-09-20 (×6): 25 mg via ORAL
  Filled 2015-09-15 (×6): qty 1

## 2015-09-15 NOTE — H&P (Signed)
[facility] History and Physical  THECLA [name] [id number] DOB: [birth year] DOA: 09/15/2015  Referring physician: ED PCP: Antonietta Jewel, MD  Specialists: none  Chief Complaint:  39 year old female, known history migraines, bipolar with panic attacks, childhood asthma, mitral valve prolapse, prior urinary tract infections, Morbid obesity, Body mass index is 34.91 kg/(m^2). palpitations and recent emergency room visits 09/05/15 with left calf pain and bruising, (negative CTA negative troponins) came to [facility] long emergency room high-grade fever which started Tuesday. Did not take temperature. Diffuse body aches, nausea, migraine headaches similar to prior. Has been exposed to a cousin who has had similar symptoms with fevers and chills No diarrhea Decreased appetite Had mild vomiting and EMS truck and was given 1000 g Tylenol because she was febrile 102 per report Also complaining of some mild central chest pain past couple of days with no radiation. Significant pain in the left side of the back Also states some neck stiffness and discomfort but this is similar to migraine-like pain that she's had in the past Tells me that she's had prior multiple episodes of UTIs which are similar to this episode   emergency room workup revealed slightly elevated point care hCG , CT head with contrast negative for any acute findings, nitrite plus leukocyte positive on UA , cloudy urine with many bacteria , urine culture performed  Lactic acid 3.2 Chest x-ray showed no pneumonia    Review of Systems:  Past Medical History  Diagnosis Date  . Panic attacks   . Scoliosis   . Mitral valve prolapse   . Asthma     As a child  . Renal disorder   . UTI (lower urinary tract infection)   . Hypertension   . Ovarian cyst   . Migraine   . Vertigo   . Degenerative disk disease    Past Surgical History  Procedure Laterality Date  . Tubal ligation    . Laparoscopy    . Cesarean section    .  Cholecystectomy    . Knee surgery    . Back surgery    . Radio frequency albiation      Social History:  Social History   Social History Narrative   Married with 3 children in their teens   Student at [employer]  . Sulfonamide Derivatives Anaphylaxis and Rash    Patient was hospitalized  . Ciprofloxacin Itching    Benadryl does not help  . Norvasc [Amlodipine] Swelling  . Amlodipine Besylate Rash    Family History  Problem Relation Age of Onset  . Hypertension Mother   . Diabetes Mother   . Stroke Mother   . Heart disease Mother   . Hypertension Father   . Diabetes Father   . Dementia Father   . Schizophrenia Father     Prior to Admission medications   Medication Sig Start Date End Date Taking? Authorizing Provider  cyclobenzaprine (FLEXERIL) 10 MG tablet Take 1 tablet (10 mg total) by mouth 3 (three) times daily as needed for muscle spasms. 08/24/15  Yes Carlisle Cater, PA-C  ibuprofen (ADVIL,MOTRIN) 200 MG tablet Take 2 tablets (400 mg total) by mouth every 6 (six) hours as needed for headache or moderate pain. 09/05/15  Yes Harvel Quale, MD  Pseudoephedrine-APAP-DM (TYLENOL COLD/FLU SEVERE DAY PO) Take 2 tablets by mouth every 4 (four) hours as needed (flu like symptoms).   Yes Historical Provider, MD  amitriptyline (ELAVIL) 25 MG tablet Take 1 tablet by  mouth daily. 03/19/14 03/19/15  Historical Provider, MD  aspirin 81 MG chewable tablet Chew 81 mg by mouth daily.    Historical Provider, MD  Multiple Vitamin (MULTIVITAMIN WITH MINERALS) TABS tablet Take 1 tablet by mouth daily.    Historical Provider, MD  nitroGLYCERIN (NITROSTAT) 0.4 MG SL tablet Place 0.4 mg under the tongue every 5 (five) minutes as needed for chest pain.    Historical Provider, MD  Omega-3 Fatty Acids (FISH OIL) 1000 MG CAPS Take 1 capsule by mouth daily.    Historical Provider, MD  OVER THE COUNTER MEDICATION Take 2 tablets by mouth 4 (four) times daily as needed  (focus). "Focus Fast" or "Fast Focus" otc herbal supplement    Historical Provider, MD  traMADol (ULTRAM) 50 MG tablet Take 1 tablet (50 mg total) by mouth every 6 (six) hours as needed. Patient not taking: Reported on 09/15/2015 06/27/15   Larene Pickett, PA-C   Physical Exam: Danley Danker Vitals:   09/15/15 0531 09/15/15 0609 09/15/15 0726  BP: 120/77 124/76   Pulse: 99 93   Temp: 102.1 F (38.9 C)  97.5 F (36.4 C)  TempSrc: Oral  Oral  Resp: 20    Height: 5\' 3"  (1.6 m)    Weight: 89.359 kg (197 lb)    SpO2: 99% 96%     EOMI NCAT mild pallor no icterus mucosa dry no JVD S1-S2 tachycardic slightly with no murmur I can note abdomen is soft however she does have significant right flank tenderness in the CVA angle Mild lower extremity edema No injection to her throat Brudzinski and Kernig sign equivocalg  Powers 5 on 5 bilaterally Reflexes are grossly within normal limits   Labs on Admission:  Basic Metabolic Panel:  Recent Labs Lab 09/15/15 0545  NA 133*  K 3.3*  CL 104  CO2 21*  GLUCOSE 131*  BUN 8  CREATININE 0.64  CALCIUM 8.2*   Liver Function Tests:  Recent Labs Lab 09/15/15 0545  AST 61*  ALT 80*  ALKPHOS 58  BILITOT 0.7  PROT 6.5  ALBUMIN 3.0*   No results for input(s): LIPASE, AMYLASE in the last 168 hours. No results for input(s): AMMONIA in the last 168 hours. CBC:  Recent Labs Lab 09/15/15 0545  WBC 17.5*  NEUTROABS 13.3*  HGB 10.5*  HCT 31.2*  MCV 82.1  PLT 328   Cardiac Enzymes: No results for input(s): CKTOTAL, CKMB, CKMBINDEX, TROPONINI in the last 168 hours.  BNP (last 3 results)  Recent Labs  09/05/15 1131  BNP 10.2    ProBNP (last 3 results) No results for input(s): PROBNP in the last 8760 hours.  CBG: No results for input(s): GLUCAP in the last 168 hours.  Radiological Exams on Admission: Dg Chest 2 View  09/15/2015  CLINICAL DATA:  Sepsis protocol.  Fever and chills with body aches EXAM: CHEST  2 VIEW COMPARISON:   09/05/2015 FINDINGS: Normal heart size and mediastinal contours. No acute infiltrate or edema. No effusion or pneumothorax. Thoracic dextroscoliosis. No acute osseous findings. Cholecystectomy clips. IMPRESSION: Negative.  No pneumonia. Electronically Signed   By: Monte Fantasia M.D.   On: 09/15/2015 06:03   Ct Head Wo Contrast  09/15/2015  CLINICAL DATA:  Fever, headache, nausea and vomiting for 2 days. Initial encounter. EXAM: CT HEAD WITHOUT CONTRAST TECHNIQUE: Contiguous axial images were obtained from the base of the skull through the vertex without intravenous contrast. COMPARISON:  Brain MRI and head CT scan 10/05/2013. FINDINGS: No evidence of acute  intracranial abnormality including hemorrhage, infarct, mass lesion, mass effect, midline shift or abnormal extra-axial fluid collection. Small cyst at the superior vermis with an associated irregular appearance of the vermis is unchanged. Imaged paranasal sinuses and mastoid air cells are clear. No hydrocephalus or pneumocephalus. The calvarium is intact. IMPRESSION: No acute abnormality.  Stable compared to prior exam. Electronically Signed   By: Inge Rise M.D.   On: 09/15/2015 07:11    EKG: Independently reviewed. None performed  Assessment/Plan Principal Problem:   Sepsis due to hemophilus influenzae vs Pyelonephritis -Suggestive of potential pyelonephritis given exam. -She has not had a by mouth trial in the emergency room but given lactic acidosis and for chills feel it is reasonable to observe her at least overnight. -Would narrow to Zosyn monotherapy and empirically treat--- await urine culture results and likely transition to nitrofurantoin and 1-2 days once patient feels better -Repeat CBC plus differential in a.m. -IV saline 125 cc an hour for now Active Problems:   Mitral valve disorder -Monitor   Panic attacks - Monitor and can continue amitriptyline 25 mg for this  Hypertension\ -Patient has a history of hypertension  but is on no medications so we will just monitor   Scoliosis -Has had scoliosis surgery in the past can continue Flexeril if needed although patient states she doesn't really take any over-the-counter medications or any other medications   Asthma -Childhood asthma and this is clinically quiescent Chest pain -probably from all mylagias she has been having.  Recent work-up in Ed neg.  Monitor and get EKG if persisitent   Full code obs tele Discussed c family bedside  Time spent: [place], [facility] Pager [phone]  If 7PM-7AM, please contact night-coverage [email or web address] Password [facility] 09/15/2015, 7:44 AM

## 2015-09-15 NOTE — Progress Notes (Signed)
Pt temp 102.5 axillary.  Dr Verlon Au notified.

## 2015-09-15 NOTE — ED Notes (Signed)
Report given to Chasity, RN-will transfer to 5th floor

## 2015-09-15 NOTE — ED Notes (Signed)
Patient arrives by EMS with complaints flu like symptoms, fever, headache, chills, nauseated.  Symptoms started 2 days ago.  Fever 103.5 per EMS, last dose 2300 last night tylenol cold and flu, EMS administered tylenol 1000 mg prior to arrival

## 2015-09-15 NOTE — ED Notes (Signed)
Pt has had fever, chills, body aches and N/V x 2 days.  OTC medications not providing relief.

## 2015-09-15 NOTE — ED Provider Notes (Signed)
TIME SEEN: 5:50 AM  CHIEF COMPLAINT: Fever, headache, body aches, nausea  HPI: Pt is a 39 y.o. female with history of hypertension, mitral valve prolapse, migraine headaches who presents emergency department with 2 days of fever, body aches, diffuse throbbing headache, chills, nausea and one episode of vomiting. States she was with her cousin over the weekend who had similar symptoms. She states she does have pain in her neck but is complaining of pain all over. No rash. No tick bites. No diarrhea. No ear pain, sore throat.  Does report a dry cough. Given Tylenol with EMS. She has not had an influenza vaccination this year. States that she thinks that this headache is similar to her previous migraines. She states that she feels like she has the flu. Patient also reports however that she has had urinary frequency. No dysuria or hematuria. No vaginal bleeding or discharge. No abdominal pain.  ROS: See HPI Constitutional:  fever  Eyes: no drainage  ENT: no runny nose   Cardiovascular:  no chest pain  Resp: no SOB  GI:  vomiting GU: no dysuria or hematuria Integumentary: no rash  Allergy: no hives  Musculoskeletal: no leg swelling  Neurological: no slurred speech ROS otherwise negative  PAST MEDICAL HISTORY/PAST SURGICAL HISTORY:  Past Medical History  Diagnosis Date  . Panic attacks   . Scoliosis   . Mitral valve prolapse   . Asthma     As a child  . Renal disorder   . UTI (lower urinary tract infection)   . Hypertension   . Ovarian cyst   . Migraine   . Vertigo   . Degenerative disk disease     MEDICATIONS:  Prior to Admission medications   Medication Sig Start Date End Date Taking? Authorizing Provider  amitriptyline (ELAVIL) 25 MG tablet Take 1 tablet by mouth daily. 03/19/14 03/19/15  Historical Provider, MD  aspirin 81 MG chewable tablet Chew 81 mg by mouth daily.    Historical Provider, MD  BIOTIN PO Take by mouth.    Historical Provider, MD  cyclobenzaprine (FLEXERIL) 10  MG tablet Take 1 tablet (10 mg total) by mouth 3 (three) times daily as needed for muscle spasms. 08/24/15   Carlisle Cater, PA-C  ibuprofen (ADVIL,MOTRIN) 200 MG tablet Take 2 tablets (400 mg total) by mouth every 6 (six) hours as needed for headache or moderate pain. 09/05/15   Harvel Quale, MD  Multiple Vitamin (MULTIVITAMIN WITH MINERALS) TABS tablet Take 1 tablet by mouth daily.    Historical Provider, MD  nitroGLYCERIN (NITROSTAT) 0.4 MG SL tablet Place 0.4 mg under the tongue every 5 (five) minutes as needed for chest pain.    Historical Provider, MD  Omega-3 Fatty Acids (FISH OIL) 1000 MG CAPS Take 1 capsule by mouth daily.    Historical Provider, MD  OVER THE COUNTER MEDICATION Take 2 tablets by mouth 4 (four) times daily as needed (focus). "Focus Fast" or "Fast Focus" otc herbal supplement    Historical Provider, MD  traMADol (ULTRAM) 50 MG tablet Take 1 tablet (50 mg total) by mouth every 6 (six) hours as needed. Patient taking differently: Take 50 mg by mouth every 6 (six) hours as needed for moderate pain.  06/27/15   Larene Pickett, PA-C    ALLERGIES:  Allergies  Allergen Reactions  . Sulfonamide Derivatives Anaphylaxis and Rash    Patient was hospitalized  . Ciprofloxacin Itching    Benadryl does not help  . Norvasc [Amlodipine] Swelling  .  Amlodipine Besylate Rash    SOCIAL HISTORY:  Social History  Substance Use Topics  . Smoking status: Former Research scientist (life sciences)  . Smokeless tobacco: Never Used  . Alcohol Use: No    FAMILY HISTORY: Family History  Problem Relation Age of Onset  . Hypertension Mother   . Diabetes Mother   . Stroke Mother   . Heart disease Mother   . Hypertension Father   . Diabetes Father   . Dementia Father   . Schizophrenia Father     EXAM: BP 120/77 mmHg  Pulse 99  Temp(Src) 102.1 F (38.9 C) (Oral)  Resp 20  Ht 5\' 3"  (1.6 m)  Wt 197 lb (89.359 kg)  BMI 34.91 kg/m2  SpO2 99%  LMP 08/18/2015 (Exact Date) CONSTITUTIONAL: Alert and oriented  and responds appropriately to questions. Well-appearing; well-nourished HEAD: Normocephalic EYES: Conjunctivae clear, PERRL ENT: normal nose; no rhinorrhea; moist mucous membranes; pharynx without lesions noted, no tonsillar hypertrophy or exudate, no uvular deviation, no trismus or drooling, normal phonation, no stridor, no dental caries or abscess noted, no Ludwig's angina, tongue sits flat in the bottom of the mouth NECK: Supple, no meningismus, no LAD; full and painless range of motion in the neck, able to touch her chin to her chest without difficulty CARD: RRR; S1 and S2 appreciated; no murmurs, no clicks, no rubs, no gallops RESP: Normal chest excursion without splinting or tachypnea; breath sounds clear and equal bilaterally; no wheezes, no rhonchi, no rales, no hypoxia or respiratory distress, speaking full sentences ABD/GI: Normal bowel sounds; non-distended; soft, non-tender, no rebound, no guarding, no peritoneal signs BACK:  The back appears normal and is non-tender to palpation, there is no CVA tenderness EXT: Normal ROM in all joints; non-tender to palpation; no edema; normal capillary refill; no cyanosis, no calf tenderness or swelling    SKIN: Normal color for age and race; warm, no rash NEURO: Moves all extremities equally, sensation to light touch intact diffusely, cranial nerves II through XII intact PSYCH: The patient's mood and manner are appropriate. Grooming and personal hygiene are appropriate.  MEDICAL DECISION MAKING: Patient here with flulike symptoms. Labs, cultures, urine and chest x-ray ordered by nursing staff. We'll give IV fluids, Toradol and Zofran for symptomatic relief.  ED PROGRESS: Patient's has a leukocytosis of 17,000 with left shift. Lactate is mildly elevated at 3.25. Chest x-ray shows no pneumonia. We'll continue IV hydration.  Discussed with patient that this could potentially be meningitis although my suspicion for this is lower and I feel like this is  likely influenza or other viral illness. Discussed with patient that I would like to try to treat her fever to see if this improves her headache, body aches. Discussed the possibility of a lumbar puncture. She agrees with this plan.   Patient's i-STAT hCG very mildly elevated. Point-of-care urine pregnancy however is negative. Suspect this initial lab is an error.   7:00 AM  Pt reports her headache has improved. She states that she feels like this is likely migraines. Will give dose of Reglan and Benadryl. She does have a nitrite-positive UTI on urinalysis. Culture pending. Given she meets sepsis criteria she will receive broad-spectrum antibiotics vancomycin and Zosyn. She has received 2 L of IV fluids and will receive a third.  Will admit to hospitalist service. PCP is Dr. Volney American in [place].   7:25 AM  D/w Dr. Verlon Au with hospitalist service for admission.     [place], DO 09/15/15 [phone]

## 2015-09-15 NOTE — ED Notes (Signed)
Bed: [id number] Expected date:  Expected time:  Means of arrival:  Comments: EMS, flu like symptoms x 2 days, fever, hypertension 190/110

## 2015-09-16 DIAGNOSIS — M436 Torticollis: Secondary | ICD-10-CM | POA: Diagnosis present

## 2015-09-16 DIAGNOSIS — Z882 Allergy status to sulfonamides status: Secondary | ICD-10-CM | POA: Diagnosis not present

## 2015-09-16 DIAGNOSIS — E872 Acidosis: Secondary | ICD-10-CM | POA: Diagnosis present

## 2015-09-16 DIAGNOSIS — A4151 Sepsis due to Escherichia coli [E. coli]: Secondary | ICD-10-CM | POA: Diagnosis present

## 2015-09-16 DIAGNOSIS — Z818 Family history of other mental and behavioral disorders: Secondary | ICD-10-CM | POA: Diagnosis not present

## 2015-09-16 DIAGNOSIS — Z791 Long term (current) use of non-steroidal anti-inflammatories (NSAID): Secondary | ICD-10-CM | POA: Diagnosis not present

## 2015-09-16 DIAGNOSIS — F410 Panic disorder [episodic paroxysmal anxiety] without agoraphobia: Secondary | ICD-10-CM | POA: Diagnosis present

## 2015-09-16 DIAGNOSIS — M419 Scoliosis, unspecified: Secondary | ICD-10-CM | POA: Diagnosis present

## 2015-09-16 DIAGNOSIS — F319 Bipolar disorder, unspecified: Secondary | ICD-10-CM | POA: Diagnosis present

## 2015-09-16 DIAGNOSIS — Z833 Family history of diabetes mellitus: Secondary | ICD-10-CM | POA: Diagnosis not present

## 2015-09-16 DIAGNOSIS — Z823 Family history of stroke: Secondary | ICD-10-CM | POA: Diagnosis not present

## 2015-09-16 DIAGNOSIS — Z881 Allergy status to other antibiotic agents status: Secondary | ICD-10-CM | POA: Diagnosis not present

## 2015-09-16 DIAGNOSIS — Z9049 Acquired absence of other specified parts of digestive tract: Secondary | ICD-10-CM | POA: Diagnosis not present

## 2015-09-16 DIAGNOSIS — N12 Tubulo-interstitial nephritis, not specified as acute or chronic: Secondary | ICD-10-CM | POA: Diagnosis present

## 2015-09-16 DIAGNOSIS — Z888 Allergy status to other drugs, medicaments and biological substances status: Secondary | ICD-10-CM | POA: Diagnosis not present

## 2015-09-16 DIAGNOSIS — Z79899 Other long term (current) drug therapy: Secondary | ICD-10-CM | POA: Diagnosis not present

## 2015-09-16 DIAGNOSIS — I058 Other rheumatic mitral valve diseases: Secondary | ICD-10-CM | POA: Diagnosis present

## 2015-09-16 DIAGNOSIS — A413 Sepsis due to Hemophilus influenzae: Secondary | ICD-10-CM | POA: Diagnosis not present

## 2015-09-16 DIAGNOSIS — Z7982 Long term (current) use of aspirin: Secondary | ICD-10-CM | POA: Diagnosis not present

## 2015-09-16 DIAGNOSIS — Z8249 Family history of ischemic heart disease and other diseases of the circulatory system: Secondary | ICD-10-CM | POA: Diagnosis not present

## 2015-09-16 DIAGNOSIS — I10 Essential (primary) hypertension: Secondary | ICD-10-CM | POA: Diagnosis present

## 2015-09-16 DIAGNOSIS — R509 Fever, unspecified: Secondary | ICD-10-CM | POA: Diagnosis present

## 2015-09-16 DIAGNOSIS — G43909 Migraine, unspecified, not intractable, without status migrainosus: Secondary | ICD-10-CM | POA: Diagnosis present

## 2015-09-16 DIAGNOSIS — E876 Hypokalemia: Secondary | ICD-10-CM | POA: Diagnosis present

## 2015-09-16 DIAGNOSIS — J45909 Unspecified asthma, uncomplicated: Secondary | ICD-10-CM | POA: Diagnosis present

## 2015-09-16 DIAGNOSIS — Z6834 Body mass index (BMI) 34.0-34.9, adult: Secondary | ICD-10-CM | POA: Diagnosis not present

## 2015-09-16 DIAGNOSIS — Z87440 Personal history of urinary (tract) infections: Secondary | ICD-10-CM | POA: Diagnosis not present

## 2015-09-16 LAB — COMPREHENSIVE METABOLIC PANEL
ALT: 104 U/L — ABNORMAL HIGH (ref 14–54)
AST: 60 U/L — ABNORMAL HIGH (ref 15–41)
Albumin: 2.6 g/dL — ABNORMAL LOW (ref 3.5–5.0)
Alkaline Phosphatase: 66 U/L (ref 38–126)
Anion gap: 6 (ref 5–15)
BUN: 6 mg/dL (ref 6–20)
CHLORIDE: 110 mmol/L (ref 101–111)
CO2: 23 mmol/L (ref 22–32)
Calcium: 7.7 mg/dL — ABNORMAL LOW (ref 8.9–10.3)
Creatinine, Ser: 0.59 mg/dL (ref 0.44–1.00)
GFR calc Af Amer: >60 mL/min (ref >60)
Glucose, Bld: 95 mg/dL (ref 65–99)
POTASSIUM: 3.5 mmol/L (ref 3.5–5.1)
SODIUM: 139 mmol/L (ref 135–145)
Total Bilirubin: 0.7 mg/dL (ref 0.3–1.2)
Total Protein: 5.8 g/dL — ABNORMAL LOW (ref 6.5–8.1)

## 2015-09-16 LAB — CBC
HCT: 27.9 % — ABNORMAL LOW (ref 36.0–46.0)
Hemoglobin: 9.2 g/dL — ABNORMAL LOW (ref 12.0–15.0)
MCH: 27.9 pg (ref 26.0–34.0)
MCHC: 33 g/dL (ref 30.0–36.0)
MCV: 84.5 fL (ref 78.0–100.0)
PLATELETS: 302 10*3/uL (ref 150–400)
RBC: 3.3 MIL/uL — ABNORMAL LOW (ref 3.87–5.11)
RDW: 15.2 % (ref 11.5–15.5)
WBC: 10.3 10*3/uL (ref 4.0–10.5)

## 2015-09-16 MED ORDER — PROMETHAZINE HCL 25 MG/ML IJ SOLN
25.0000 mg | Freq: Three times a day (TID) | INTRAMUSCULAR | Status: DC | PRN
  Administered 2015-09-16 – 2015-09-19 (×4): 25 mg via INTRAVENOUS
  Filled 2015-09-16 (×4): qty 1

## 2015-09-16 MED ORDER — BUTALBITAL-APAP-CAFFEINE 50-325-40 MG PO TABS
1.0000 | ORAL_TABLET | ORAL | Status: DC | PRN
  Administered 2015-09-16 – 2015-09-18 (×5): 1 via ORAL
  Filled 2015-09-16 (×5): qty 1

## 2015-09-16 NOTE — Progress Notes (Signed)
[name] [id number] DOB: 04/22/1976 DOA: 09/15/2015 PCP: Antonietta Jewel, MD  Brief narrative:  39 year old female, known history migraines, bipolar with panic attacks, childhood asthma, mitral valve prolapse, prior urinary tract infections, Morbid obesity, Body mass index is 34.91 kg/(m^2). palpitations and recent emergency room visits 09/05/15 with left calf pain and bruising, (negative CTA negative troponins) came to [facility] long emergency room high-grade fever which started Tuesday  Admitted with findings concerning for Sepsis-felt might have been 2/2 initially to FLU or Pyelonephritis as had relatives with similar c/o  Past medical history-As per Problem list Chart reviewed as below- reiveed  Consultants:  None currently  Procedures:  none  Antibiotics:  Zosyn 11/10  Vancomycin 11/10   Subjective   Doing fair. Has seen a neurologist in the 4 headaches and was prescribed Fioricet Able to keep down some food Still not feeling completely better MAXIMUM TEMPERATURE 103 overnight     Objective    Interim History:   Telemetry: nsr   Objective: Filed Vitals:   09/15/15 1744 09/15/15 2053 09/15/15 2109 09/16/15 0506  BP:   113/60 100/60  Pulse:   100 71  Temp: 100.5 F (38.1 C) 101.7 F (38.7 C)  98.2 F (36.8 C)  TempSrc: Axillary Axillary  Oral  Resp:   20 20  Height:      Weight:      SpO2:   92% 98%    Intake/Output Summary (Last 24 hours) at 09/16/15 1016 Last data filed at 09/16/15 0921  Gross per 24 hour  Intake   1940 ml  Output      0 ml  Net   1940 ml    Exam:  General: eomi ncat Cardiovascular: S1-S2 no murmur rub or gallop Respiratory: Clinically clear no added sound Abdomen: Soft nontender nondistended however right flank discomfort Skin no lower extremity edema Neuro intact  Data Reviewed: Basic Metabolic Panel:  Recent Labs Lab 09/15/15 0545 09/16/15 0550  NA 133* 139  K 3.3* 3.5  CL 104 110  CO2 21* 23    GLUCOSE 131* 95  BUN 8 6  CREATININE 0.64 0.59  CALCIUM 8.2* 7.7*   Liver Function Tests:  Recent Labs Lab 09/15/15 0545 09/16/15 0550  AST 61* 60*  ALT 80* 104*  ALKPHOS 58 66  BILITOT 0.7 0.7  PROT 6.5 5.8*  ALBUMIN 3.0* 2.6*   No results for input(s): LIPASE, AMYLASE in the last 168 hours. No results for input(s): AMMONIA in the last 168 hours. CBC:  Recent Labs Lab 09/15/15 0545 09/16/15 0550  WBC 17.5* 10.3  NEUTROABS 13.3*  --   HGB 10.5* 9.2*  HCT 31.2* 27.9*  MCV 82.1 84.5  PLT 328 302   Cardiac Enzymes: No results for input(s): CKTOTAL, CKMB, CKMBINDEX, TROPONINI in the last 168 hours. BNP: Invalid input(s): POCBNP CBG: No results for input(s): GLUCAP in the last 168 hours.  No results found for this or any previous visit (from the past 240 hour(s)).   Studies:              All Imaging reviewed and is as per above notation   Scheduled Meds: . amitriptyline  25 mg Oral Daily  . antiseptic oral rinse  7 mL Mouth Rinse q12n4p  . aspirin  81 mg Oral Daily  . chlorhexidine  15 mL Mouth Rinse BID  . enoxaparin (LOVENOX) injection  40 mg Subcutaneous Q24H  . piperacillin-tazobactam (ZOSYN)  IV  3.375 g Intravenous 3 times per day  Continuous Infusions: . sodium chloride 125 mL/hr (09/16/15 0502)  . sodium chloride 150 mL/hr at 09/15/15 1030     Assessment/Plan:   1. Sepsis secondary to pyelonephritis-some nausea and vomiting today therefore prescribed Phenergan. Continue broad-spectrum Zosyn. Will discontinue vancomycin. Urine culture growing above 100,000 colony-forming units and cultures pending white count down from 17-13 expect resolution. Would continue sodium chloride 125 cc per hour today and recheck basic metabolic panel in a.m. 2. Migraine headaches-start Fioricet 1 tab every 4 when necessary. May use ibuprofen 600 and tramado every 6 when necessary  as well. May continue Flexeril 10 mg 3 times a day when necessary 3. Altered LFTs-patient  has a habitus of obesity and probably will need a screening ultrasound as an outpatient to rule out steatosis and cirrhosis.  If there is not a down trend and LFTs would get hepatitis panel 4. bipolar-continue Elavil 25 mg daily 5. Hypokalemia resolved 6. Asthma-clinically stable with no new issues 7. Mitral prolapse-clinically stable 8. Morbid obesity-outpatient follow-up needed and weight loss counseling as well   Code Status: Full Family Communication: Discussed with the mother at the bedside Disposition Plan: Inpatient likely hospitalization 48 more hours   Verneita Griffes, MD  [facility] Pager [phone] 09/16/2015, 10:16 AM

## 2015-09-16 NOTE — Care Management Note (Signed)
Case Management Note  Patient Details  Name: ADOREE LUBER MRN: [id number] Date of Birth: September 02, 1976  Subjective/Objective: 38 y/o f admitted w/Sepsis. From home.                   Action/Plan:d/c plan home.   Expected Discharge Date:   (unknown)               Expected Discharge Plan:  Home/Self Care  In-House Referral:     Discharge planning Services  CM Consult  Post Acute Care Choice:    Choice offered to:     DME Arranged:    DME Agency:     HH Arranged:    HH Agency:     Status of Service:  In process, will continue to follow  Medicare Important Message Given:    Date Medicare IM Given:    Medicare IM give by:    Date Additional Medicare IM Given:    Additional Medicare Important Message give by:     If discussed at [place] of Stay Meetings, dates discussed:    Additional Comments:  Dessa Phi, RN 09/16/2015, 3:18 PM

## 2015-09-17 ENCOUNTER — Inpatient Hospital Stay (HOSPITAL_COMMUNITY)

## 2015-09-17 LAB — CBC
HEMATOCRIT: 26.7 % — AB (ref 36.0–46.0)
Hemoglobin: 9 g/dL — ABNORMAL LOW (ref 12.0–15.0)
MCH: 28 pg (ref 26.0–34.0)
MCHC: 33.7 g/dL (ref 30.0–36.0)
MCV: 82.9 fL (ref 78.0–100.0)
PLATELETS: 321 10*3/uL (ref 150–400)
RBC: 3.22 MIL/uL — ABNORMAL LOW (ref 3.87–5.11)
RDW: 14.9 % (ref 11.5–15.5)
WBC: 9.8 10*3/uL (ref 4.0–10.5)

## 2015-09-17 LAB — COMPREHENSIVE METABOLIC PANEL
ALT: 90 U/L — ABNORMAL HIGH (ref 14–54)
ANION GAP: 7 (ref 5–15)
AST: 42 U/L — ABNORMAL HIGH (ref 15–41)
Albumin: 2.6 g/dL — ABNORMAL LOW (ref 3.5–5.0)
Alkaline Phosphatase: 69 U/L (ref 38–126)
BILIRUBIN TOTAL: 0.2 mg/dL — AB (ref 0.3–1.2)
BUN: 8 mg/dL (ref 6–20)
CHLORIDE: 104 mmol/L (ref 101–111)
CO2: 25 mmol/L (ref 22–32)
Calcium: 7.9 mg/dL — ABNORMAL LOW (ref 8.9–10.3)
Creatinine, Ser: 0.77 mg/dL (ref 0.44–1.00)
GFR calc Af Amer: >60 mL/min (ref >60)
Glucose, Bld: 124 mg/dL — ABNORMAL HIGH (ref 65–99)
POTASSIUM: 3 mmol/L — AB (ref 3.5–5.1)
Sodium: 136 mmol/L (ref 135–145)
TOTAL PROTEIN: 6 g/dL — AB (ref 6.5–8.1)

## 2015-09-17 LAB — LIPASE, BLOOD: LIPASE: 19 U/L (ref 11–51)

## 2015-09-17 LAB — URINE CULTURE: Culture: >=100000

## 2015-09-17 MED ORDER — POTASSIUM CHLORIDE IN NACL 40-0.9 MEQ/L-% IV SOLN
INTRAVENOUS | Status: DC
  Administered 2015-09-17 – 2015-09-19 (×5): 100 mL/h via INTRAVENOUS
  Filled 2015-09-17 (×8): qty 1000

## 2015-09-17 MED ORDER — MUSCLE RUB 10-15 % EX CREA
TOPICAL_CREAM | CUTANEOUS | Status: DC | PRN
  Administered 2015-09-17: 22:00:00 via TOPICAL
  Filled 2015-09-17: qty 85

## 2015-09-17 MED ORDER — OXYCODONE HCL 5 MG PO TABS
5.0000 mg | ORAL_TABLET | Freq: Once | ORAL | Status: AC
  Administered 2015-09-17: 5 mg via ORAL
  Filled 2015-09-17: qty 1

## 2015-09-17 MED ORDER — NITROFURANTOIN MONOHYD MACRO 100 MG PO CAPS
100.0000 mg | ORAL_CAPSULE | Freq: Two times a day (BID) | ORAL | Status: DC
  Filled 2015-09-17 (×2): qty 1

## 2015-09-17 MED ORDER — SODIUM CHLORIDE 0.9 % IV SOLN: INTRAVENOUS | Status: DC

## 2015-09-17 MED ORDER — PIPERACILLIN-TAZOBACTAM 3.375 G IVPB
3.3750 g | Freq: Three times a day (TID) | INTRAVENOUS | Status: DC
  Administered 2015-09-17 – 2015-09-18 (×4): 3.375 g via INTRAVENOUS
  Filled 2015-09-17 (×4): qty 50

## 2015-09-17 NOTE — Progress Notes (Addendum)
[name] [id number] DOB: [birth year] DOA: 09/15/2015 PCP: Antonietta Jewel, MD  Brief narrative:  38 year old female, known history migraines, bipolar with panic attacks, childhood asthma, mitral valve prolapse, prior urinary tract infections, Morbid obesity, Body mass index is 34.91 kg/(m^2). palpitations and recent emergency room visits 09/05/15 with left calf pain and bruising, (negative CTA negative troponins) came to [facility] long emergency room high-grade fever which started Tuesday  Admitted with findings concerning for Sepsis-felt might have been 2/2 initially to FLU or Pyelonephritis as had relatives with similar c/o  Past medical history-As per Problem list Chart reviewed as below- reiveed  Consultants:  None currently  Procedures:  none  Antibiotics:  Zosyn 11/10  Vancomycin 11/10-11/1   Subjective    rough night overnight 2 episodes n/v Says it was "red" Vomit after eating on both occasions No cp Back pain a little better but still +    Objective    Interim History:   Telemetry: nsr   Objective: Filed Vitals:   09/16/15 1424 09/16/15 1700 09/16/15 2200 09/17/15 0500  BP: 121/74  110/63 129/70  Pulse: 78  82 74  Temp: 98.7 F (37.1 C) 99.1 F (37.3 C) 98.5 F (36.9 C) 98.5 F (36.9 C)  TempSrc: Oral Oral Oral Oral  Resp: 20  20 18   Height:      Weight:      SpO2: 100%  97% 100%    Intake/Output Summary (Last 24 hours) at 09/17/15 0911 Last data filed at 09/17/15 0600  Gross per 24 hour  Intake   1620 ml  Output    400 ml  Net   1220 ml    Exam:  General: eomi ncat Cardiovascular: S1-S2 no murmur rub or gallop Respiratory: Clinically clear no added sound Abdomen: Soft slightly tender ion mid abdomen nondistended however right flank discomfort Skin no lower extremity edema Neuro intact  Data Reviewed: Basic Metabolic Panel:  Recent Labs Lab 09/15/15 0545 09/16/15 0550 09/17/15 0517  NA 133* 139 136  K 3.3* 3.5 3.0*    CL 104 110 104  CO2 21* 23 25  GLUCOSE 131* 95 124*  BUN 8 6 8   CREATININE 0.64 0.59 0.77  CALCIUM 8.2* 7.7* 7.9*   Liver Function Tests:  Recent Labs Lab 09/15/15 0545 09/16/15 0550 09/17/15 0517  AST 61* 60* 42*  ALT 80* 104* 90*  ALKPHOS 58 66 69  BILITOT 0.7 0.7 0.2*  PROT 6.5 5.8* 6.0*  ALBUMIN 3.0* 2.6* 2.6*   No results for input(s): LIPASE, AMYLASE in the last 168 hours. No results for input(s): AMMONIA in the last 168 hours. CBC:  Recent Labs Lab 09/15/15 0545 09/16/15 0550 09/17/15 0517  WBC 17.5* 10.3 9.8  NEUTROABS 13.3*  --   --   HGB 10.5* 9.2* 9.0*  HCT 31.2* 27.9* 26.7*  MCV 82.1 84.5 82.9  PLT 328 302 321   Cardiac Enzymes: No results for input(s): CKTOTAL, CKMB, CKMBINDEX, TROPONINI in the last 168 hours. BNP: Invalid input(s): POCBNP CBG: No results for input(s): GLUCAP in the last 168 hours.  Recent Results (from the past 240 hour(s))  Culture, blood (routine x 2)     Status: None (Preliminary result)   Collection Time: 09/15/15  5:35 AM  Result Value Ref Range Status   Specimen Description BLOOD RIGHT HAND  Final   Special Requests BOTTLES DRAWN AEROBIC AND ANAEROBIC 5ML  Final   Culture   Final    NO GROWTH 1 DAY Performed at [facility]  Report Status PENDING  Incomplete  Culture, blood (routine x 2)     Status: None (Preliminary result)   Collection Time: 09/15/15  5:45 AM  Result Value Ref Range Status   Specimen Description BLOOD LEFT HAND  Final   Special Requests BOTTLES DRAWN AEROBIC AND ANAEROBIC 5ML  Final   Culture   Final    NO GROWTH 1 DAY Performed at [facility]    Report Status PENDING  Incomplete  Urine culture     Status: None   Collection Time: 09/15/15  6:26 AM  Result Value Ref Range Status   Specimen Description URINE, CLEAN CATCH  Final   Special Requests NONE  Final   Culture   Final    >=100,000 COLONIES/mL ESCHERICHIA COLI Performed at [facility]    Report Status  09/17/2015 FINAL  Final   Organism ID, Bacteria ESCHERICHIA COLI  Final      Susceptibility   Escherichia coli - MIC*    AMPICILLIN >=32 RESISTANT Resistant     CEFAZOLIN <=4 SENSITIVE Sensitive     CEFTRIAXONE <=1 SENSITIVE Sensitive     CIPROFLOXACIN <=0.25 SENSITIVE Sensitive     GENTAMICIN <=1 SENSITIVE Sensitive     IMIPENEM <=0.25 SENSITIVE Sensitive     NITROFURANTOIN <=16 SENSITIVE Sensitive     TRIMETH/SULFA <=20 SENSITIVE Sensitive     AMPICILLIN/SULBACTAM 16 INTERMEDIATE Intermediate     PIP/TAZO <=4 SENSITIVE Sensitive     * >=100,000 COLONIES/mL ESCHERICHIA COLI     Studies:              All Imaging reviewed and is as per above notation   Scheduled Meds: . amitriptyline  25 mg Oral Daily  . antiseptic oral rinse  7 mL Mouth Rinse q12n4p  . aspirin  81 mg Oral Daily  . chlorhexidine  15 mL Mouth Rinse BID  . enoxaparin (LOVENOX) injection  40 mg Subcutaneous Q24H  . piperacillin-tazobactam (ZOSYN)  IV  3.375 g Intravenous Q8H   Continuous Infusions: . sodium chloride 125 mL/hr (09/16/15 0502)  . sodium chloride       Assessment/Plan:   1. Sepsis secondary to pyelonephritis-some nausea and vomiting today therefore prescribed Phenergan.  Back down to CLD only.  Continue broad-spectrum Zosyn. Will discontinue vancomycin. Urine culture growing above 100,000 colony-forming units E coli.  sens pending.  white count down from 17-13--->9.  Expect slower resolution. Change fluids sodium chloride 100 cc/hr + K 40 on 11/12  per hour today.  Check renal US ensure no other complicating issue such as abcess etc etc  2. Migraine headaches-start Fioricet 1 tab every 4 when necessary as per prior Neurology regimen. Hold ibuprofen 600.  Continue tramadol every 6 when necessary  as well. May continue Flexeril 10 mg 3 times a day when necessary 3. Altered LFTs-patient has a habitus of obesity and probably will need a screening ultrasound as an outpatient to rule out steatosis and  cirrhosis. Get US liver as well with US kidneys and monitor 4. bipolar-continue Elavil 25 mg daily 5. Hypokalemia replace with IV fluid and recheck am labs 6. Asthma-clinically stable with no new issues 7. Mitral prolapse-clinically stable 8. Morbid obesity-outpatient follow-up needed and weight loss counseling as well   Code Status: Full Family Communication: Discussed with the mother at the bedside Disposition Plan: Inpatient-as has clincially declined and not making significant progress, unclear dispo at present  Verneita Griffes, MD  [facility] Pager [phone] 09/17/2015, 9:11 AM  LOS: 1 day

## 2015-09-18 LAB — COMPREHENSIVE METABOLIC PANEL
ALK PHOS: 61 U/L (ref 38–126)
ALT: 68 U/L — ABNORMAL HIGH (ref 14–54)
ANION GAP: 5 (ref 5–15)
AST: 23 U/L (ref 15–41)
Albumin: 2.7 g/dL — ABNORMAL LOW (ref 3.5–5.0)
BILIRUBIN TOTAL: 0.6 mg/dL (ref 0.3–1.2)
BUN: <5 mg/dL — ABNORMAL LOW (ref 6–20)
CALCIUM: 8.3 mg/dL — AB (ref 8.9–10.3)
CO2: 26 mmol/L (ref 22–32)
CREATININE: 0.59 mg/dL (ref 0.44–1.00)
Chloride: 106 mmol/L (ref 101–111)
Glucose, Bld: 91 mg/dL (ref 65–99)
Potassium: 3.8 mmol/L (ref 3.5–5.1)
SODIUM: 137 mmol/L (ref 135–145)
TOTAL PROTEIN: 6.5 g/dL (ref 6.5–8.1)

## 2015-09-18 LAB — CBC WITH DIFFERENTIAL/PLATELET
BASOS ABS: 0 10*3/uL (ref 0.0–0.1)
Basophils Relative: 0 %
EOS PCT: 5 %
Eosinophils Absolute: 0.3 10*3/uL (ref 0.0–0.7)
HCT: 28.9 % — ABNORMAL LOW (ref 36.0–46.0)
Hemoglobin: 9.3 g/dL — ABNORMAL LOW (ref 12.0–15.0)
LYMPHS PCT: 26 %
Lymphs Abs: 1.7 10*3/uL (ref 0.7–4.0)
MCH: 27.2 pg (ref 26.0–34.0)
MCHC: 32.2 g/dL (ref 30.0–36.0)
MCV: 84.5 fL (ref 78.0–100.0)
MONO ABS: 1.3 10*3/uL — AB (ref 0.1–1.0)
Monocytes Relative: 20 %
Neutro Abs: 3.2 10*3/uL (ref 1.7–7.7)
Neutrophils Relative %: 49 %
Platelets: 432 10*3/uL — ABNORMAL HIGH (ref 150–400)
RBC: 3.42 MIL/uL — ABNORMAL LOW (ref 3.87–5.11)
RDW: 15.3 % (ref 11.5–15.5)
WBC: 6.6 10*3/uL (ref 4.0–10.5)

## 2015-09-18 LAB — PROTIME-INR
INR: 1.16 (ref 0.00–1.49)
PROTHROMBIN TIME: 15 s (ref 11.6–15.2)

## 2015-09-18 MED ORDER — NITROFURANTOIN MONOHYD MACRO 100 MG PO CAPS
100.0000 mg | ORAL_CAPSULE | Freq: Two times a day (BID) | ORAL | Status: DC
  Administered 2015-09-18 – 2015-09-20 (×4): 100 mg via ORAL
  Filled 2015-09-18 (×5): qty 1

## 2015-09-18 MED ORDER — MORPHINE SULFATE (PF) 2 MG/ML IV SOLN
1.0000 mg | INTRAVENOUS | Status: AC | PRN
  Administered 2015-09-18 (×2): 2 mg via INTRAVENOUS
  Filled 2015-09-18 (×2): qty 1

## 2015-09-18 MED ORDER — MORPHINE SULFATE (PF) 2 MG/ML IV SOLN: 1.0000 mg | INTRAVENOUS | Status: DC | PRN

## 2015-09-18 NOTE — Progress Notes (Signed)
[name] [id number] DOB: [birth year] DOA: 09/15/2015 PCP: Antonietta Jewel, MD  Brief narrative:  39 year old female, known history migraines, bipolar with panic attacks, childhood asthma, mitral valve prolapse, prior urinary tract infections, Morbid obesity, Body mass index is 34.91 kg/(m^2). palpitations and recent emergency room visits 09/05/15 with left calf pain and bruising, (negative CTA negative troponins) came to [facility] long emergency room high-grade fever which started Tuesday  Admitted with findings concerning for Sepsis-felt might have been 2/2 initially to FLU or Pyelonephritis as had relatives with similar c/o  Past medical history-As per Problem list Chart reviewed as below- reiveed  Consultants:  None currently  Procedures:  none  Antibiotics:  Zosyn 11/10-11/13  Vancomycin 11/10-11/1   nitrofurantoin 11/13    Subjective   Headaches are severe requesting IV pain meds No nausea but has been taking Phenergan Abdominal pain is worse but flank pain is better Tolerated liquid diet yesterday No chest pain no fevers    Objective    Interim History:   Telemetry: nsr   Objective: Filed Vitals:   09/16/15 2200 09/17/15 0500 09/17/15 2113 09/18/15 0559  BP: 110/63 129/70 147/99 133/82  Pulse: 82 74 80 74  Temp: 98.5 F (36.9 C) 98.5 F (36.9 C) 98.7 F (37.1 C) 97.4 F (36.3 C)  TempSrc: Oral Oral Axillary Axillary  Resp: 20 18 18 18   Height:      Weight:      SpO2: 97% 100% 99% 99%    Intake/Output Summary (Last 24 hours) at 09/18/15 0934 Last data filed at 09/18/15 0600  Gross per 24 hour  Intake   2700 ml  Output      0 ml  Net   2700 ml    Exam:  General: eomi ncat Cardiovascular: S1-S2 no murmur rub or gallop Respiratory: Clinically clear no added sound Abdomen: Soft slightly tender ion mid abdomen nondistended however right flank discomfort Skin no lower extremity edema Neuro intact  Data Reviewed: Basic Metabolic  Panel:  Recent Labs Lab 09/15/15 0545 09/16/15 0550 09/17/15 0517 09/18/15 0555  NA 133* 139 136 137  K 3.3* 3.5 3.0* 3.8  CL 104 110 104 106  CO2 21* 23 25 26   GLUCOSE 131* 95 124* 91  BUN 8 6 8  <5*  CREATININE 0.64 0.59 0.77 0.59  CALCIUM 8.2* 7.7* 7.9* 8.3*   Liver Function Tests:  Recent Labs Lab 09/15/15 0545 09/16/15 0550 09/17/15 0517 09/18/15 0555  AST 61* 60* 42* 23  ALT 80* 104* 90* 68*  ALKPHOS 58 66 69 61  BILITOT 0.7 0.7 0.2* 0.6  PROT 6.5 5.8* 6.0* 6.5  ALBUMIN 3.0* 2.6* 2.6* 2.7*    Recent Labs Lab 09/17/15 0517  LIPASE 19   No results for input(s): AMMONIA in the last 168 hours. CBC:  Recent Labs Lab 09/15/15 0545 09/16/15 0550 09/17/15 0517  WBC 17.5* 10.3 9.8  NEUTROABS 13.3*  --   --   HGB 10.5* 9.2* 9.0*  HCT 31.2* 27.9* 26.7*  MCV 82.1 84.5 82.9  PLT 328 302 321   Cardiac Enzymes: No results for input(s): CKTOTAL, CKMB, CKMBINDEX, TROPONINI in the last 168 hours. BNP: Invalid input(s): POCBNP CBG: No results for input(s): GLUCAP in the last 168 hours.  Recent Results (from the past 240 hour(s))  Culture, blood (routine x 2)     Status: None (Preliminary result)   Collection Time: 09/15/15  5:35 AM  Result Value Ref Range Status   Specimen Description BLOOD RIGHT HAND  Final  Special Requests BOTTLES DRAWN AEROBIC AND ANAEROBIC 5ML  Final   Culture   Final    NO GROWTH 2 DAYS Performed at [facility]    Report Status PENDING  Incomplete  Culture, blood (routine x 2)     Status: None (Preliminary result)   Collection Time: 09/15/15  5:45 AM  Result Value Ref Range Status   Specimen Description BLOOD LEFT HAND  Final   Special Requests BOTTLES DRAWN AEROBIC AND ANAEROBIC 5ML  Final   Culture   Final    NO GROWTH 2 DAYS Performed at [facility]    Report Status PENDING  Incomplete  Urine culture     Status: None   Collection Time: 09/15/15  6:26 AM  Result Value Ref Range Status   Specimen  Description URINE, CLEAN CATCH  Final   Special Requests NONE  Final   Culture   Final    >=100,000 COLONIES/mL ESCHERICHIA COLI Performed at [facility]    Report Status 09/17/2015 FINAL  Final   Organism ID, Bacteria ESCHERICHIA COLI  Final      Susceptibility   Escherichia coli - MIC*    AMPICILLIN >=32 RESISTANT Resistant     CEFAZOLIN <=4 SENSITIVE Sensitive     CEFTRIAXONE <=1 SENSITIVE Sensitive     CIPROFLOXACIN <=0.25 SENSITIVE Sensitive     GENTAMICIN <=1 SENSITIVE Sensitive     IMIPENEM <=0.25 SENSITIVE Sensitive     NITROFURANTOIN <=16 SENSITIVE Sensitive     TRIMETH/SULFA <=20 SENSITIVE Sensitive     AMPICILLIN/SULBACTAM 16 INTERMEDIATE Intermediate     PIP/TAZO <=4 SENSITIVE Sensitive     * >=100,000 COLONIES/mL ESCHERICHIA COLI     Studies:              All Imaging reviewed and is as per above notation   Scheduled Meds: . amitriptyline  25 mg Oral Daily  . antiseptic oral rinse  7 mL Mouth Rinse q12n4p  . chlorhexidine  15 mL Mouth Rinse BID  . enoxaparin (LOVENOX) injection  40 mg Subcutaneous Q24H  . piperacillin-tazobactam (ZOSYN)  IV  3.375 g Intravenous Q8H   Continuous Infusions: . 0.9 % NaCl with KCl 40 mEq / L 100 mL/hr (09/17/15 [id number])     Assessment/Plan:   1. Sepsis secondary to pyelonephritis-nausea and vomiting 09/17/15- prescribed Phenergan. Placed back CLD-->soft diet.    vancomycin DC 11/11.transition Zosyn--> nitrofurantoin 11/30 .  Urine culture growing above 100,000 colony-forming units E coli.  sens  white count down from 17-13--->9 epeat CBC a.m. .  Expect slower resolution. Change fluids sodium chloride 100 cc/hr + K 40 on 11/12  per hour todayAbdominal ultrasound no acute intra-abdominal pathology. 2. Migraine headaches-start Fioricet 1 tab every 4 when necessary as per prior Neurology regimen. Hold ibuprofen 600.  Continue tramadol every 6 when necessary  as well. May continue Flexeril 10 mg 3 times a day when necessary--because  of increasing headaches we have prescribed one or 2 doses of IV morphine 1-2 mg.   3. Altered LFTs-patient has a habitus of obesity .  she has been taking Fioricet in addition to Tylenol scheduled at home which may also contribute to elevated LFTs-see above discussion re-ultrasound  4. bipolar-continue Elavil 25 mg daily 5. Hypokalemia replace with IV fluid and recheck am labs--potassium resolved to 3.8  6. Asthma-clinically stable with no new issues 7. Mitral prolapse-clinically stable 8. Morbid obesity-outpatient follow-up needed and weight loss counseling as well   Code Status: Full Family Communication:  Discussed with the mother, husband at the bedside Disposition Plan: Inpatient-expect resolution within 3-4 days   Verneita Griffes, MD  [facility] Pager [phone] 09/18/2015, 9:34 AM    LOS: 2 days     U yesterday

## 2015-09-19 LAB — COMPREHENSIVE METABOLIC PANEL
ALT: 67 U/L — AB (ref 14–54)
ANION GAP: 7 (ref 5–15)
AST: 31 U/L (ref 15–41)
Albumin: 3 g/dL — ABNORMAL LOW (ref 3.5–5.0)
Alkaline Phosphatase: 68 U/L (ref 38–126)
BUN: 8 mg/dL (ref 6–20)
CHLORIDE: 105 mmol/L (ref 101–111)
CO2: 25 mmol/L (ref 22–32)
CREATININE: 0.62 mg/dL (ref 0.44–1.00)
Calcium: 8.7 mg/dL — ABNORMAL LOW (ref 8.9–10.3)
GFR calc non Af Amer: >60 mL/min (ref >60)
Glucose, Bld: 99 mg/dL (ref 65–99)
Potassium: 4.3 mmol/L (ref 3.5–5.1)
SODIUM: 137 mmol/L (ref 135–145)
Total Bilirubin: 0.3 mg/dL (ref 0.3–1.2)
Total Protein: 6.8 g/dL (ref 6.5–8.1)

## 2015-09-19 MED ORDER — SODIUM CHLORIDE 0.9 % IJ SOLN
3.0000 mL | Freq: Two times a day (BID) | INTRAMUSCULAR | Status: DC
  Administered 2015-09-19: 3 mL via INTRAVENOUS

## 2015-09-19 MED ORDER — ONDANSETRON 4 MG PO TBDP
4.0000 mg | ORAL_TABLET | Freq: Once | ORAL | Status: AC
  Administered 2015-09-19: 4 mg via ORAL
  Filled 2015-09-19: qty 1

## 2015-09-19 MED ORDER — BUTALBITAL-APAP-CAFFEINE 50-325-40 MG PO TABS
1.0000 | ORAL_TABLET | ORAL | Status: DC
  Administered 2015-09-19 – 2015-09-20 (×5): 1 via ORAL
  Filled 2015-09-19 (×5): qty 1

## 2015-09-19 MED ORDER — HYDROCERIN EX CREA
TOPICAL_CREAM | Freq: Two times a day (BID) | CUTANEOUS | Status: DC
  Administered 2015-09-19: 19:00:00 via TOPICAL
  Filled 2015-09-19: qty 113

## 2015-09-19 NOTE — Progress Notes (Signed)
[name] [id number] DOB: Sep 22, 1976 DOA: 09/15/2015 PCP: Antonietta Jewel, MD  Brief narrative:  39 year old female, known history migraines, bipolar with panic attacks, childhood asthma, mitral valve prolapse, prior urinary tract infections, Morbid obesity, Body mass index is 34.91 kg/(m^2). palpitations and recent emergency room visits 09/05/15 with left calf pain and bruising, (negative CTA negative troponins) came to [facility] long emergency room high-grade fever which started Tuesday  Admitted with findings concerning for Sepsis-felt might have been 2/2 initially to FLU or Pyelonephritis as had relatives with similar c/o  Past medical history-As per Problem list Chart reviewed as below- reiveed  Consultants:  None currently  Procedures:  none  Antibiotics:  Zosyn 11/10-11/13  Vancomycin 11/10-11/1   nitrofurantoin 11/13    Subjective   Headaches fair No cp abd pain is better Has menses as well which started explaining some abd pain family bedside tol PO fairly well now    Objective    Interim History:   Telemetry: nsr   Objective: Filed Vitals:   09/18/15 0559 09/18/15 1436 09/18/15 2126 09/19/15 0547  BP: 133/82 106/56 134/92 145/89  Pulse: 74 76 77 64  Temp: 97.4 F (36.3 C) 98.6 F (37 C) 98.7 F (37.1 C) 98.8 F (37.1 C)  TempSrc: Axillary Oral Oral Oral  Resp: 18 18 18 17   Height:      Weight:      SpO2: 99% 100% 96% 100%    Intake/Output Summary (Last 24 hours) at 09/19/15 1600 Last data filed at 09/19/15 1500  Gross per 24 hour  Intake   3660 ml  Output      0 ml  Net   3660 ml    Exam:  General: eomi ncat Cardiovascular: S1-S2 no murmur rub or gallop Respiratory: Clinically clear no added sound Abdomen: Soft slightly tender ion mid abdomen nondistended- right flank discomfort improved Skin no lower extremity edema Neuro intact  Data Reviewed: Basic Metabolic Panel:  Recent Labs Lab 09/15/15 0545 09/16/15 0550  09/17/15 0517 09/18/15 0555 09/19/15 0510  NA 133* 139 136 137 137  K 3.3* 3.5 3.0* 3.8 4.3  CL 104 110 104 106 105  CO2 21* 23 25 26 25   GLUCOSE 131* 95 124* 91 99  BUN 8 6 8  <5* 8  CREATININE 0.64 0.59 0.77 0.59 0.62  CALCIUM 8.2* 7.7* 7.9* 8.3* 8.7*   Liver Function Tests:  Recent Labs Lab 09/15/15 0545 09/16/15 0550 09/17/15 0517 09/18/15 0555 09/19/15 0510  AST 61* 60* 42* 23 31  ALT 80* 104* 90* 68* 67*  ALKPHOS 58 66 69 61 68  BILITOT 0.7 0.7 0.2* 0.6 0.3  PROT 6.5 5.8* 6.0* 6.5 6.8  ALBUMIN 3.0* 2.6* 2.6* 2.7* 3.0*    Recent Labs Lab 09/17/15 0517  LIPASE 19   No results for input(s): AMMONIA in the last 168 hours. CBC:  Recent Labs Lab 09/15/15 0545 09/16/15 0550 09/17/15 0517 09/18/15 0555  WBC 17.5* 10.3 9.8 6.6  NEUTROABS 13.3*  --   --  3.2  HGB 10.5* 9.2* 9.0* 9.3*  HCT 31.2* 27.9* 26.7* 28.9*  MCV 82.1 84.5 82.9 84.5  PLT 328 302 321 432*   Cardiac Enzymes: No results for input(s): CKTOTAL, CKMB, CKMBINDEX, TROPONINI in the last 168 hours. BNP: Invalid input(s): POCBNP CBG: No results for input(s): GLUCAP in the last 168 hours.  Recent Results (from the past 240 hour(s))  Culture, blood (routine x 2)     Status: None (Preliminary result)   Collection Time: 09/15/15  5:35 AM  Result Value Ref Range Status   Specimen Description BLOOD RIGHT HAND  Final   Special Requests BOTTLES DRAWN AEROBIC AND ANAEROBIC 5ML  Final   Culture   Final    NO GROWTH 4 DAYS Performed at [facility]    Report Status PENDING  Incomplete  Culture, blood (routine x 2)     Status: None (Preliminary result)   Collection Time: 09/15/15  5:45 AM  Result Value Ref Range Status   Specimen Description BLOOD LEFT HAND  Final   Special Requests BOTTLES DRAWN AEROBIC AND ANAEROBIC 5ML  Final   Culture   Final    NO GROWTH 4 DAYS Performed at [facility]    Report Status PENDING  Incomplete  Urine culture     Status: None   Collection Time:  09/15/15  6:26 AM  Result Value Ref Range Status   Specimen Description URINE, CLEAN CATCH  Final   Special Requests NONE  Final   Culture   Final    >=100,000 COLONIES/mL ESCHERICHIA COLI Performed at [facility]    Report Status 09/17/2015 FINAL  Final   Organism ID, Bacteria ESCHERICHIA COLI  Final      Susceptibility   Escherichia coli - MIC*    AMPICILLIN >=32 RESISTANT Resistant     CEFAZOLIN <=4 SENSITIVE Sensitive     CEFTRIAXONE <=1 SENSITIVE Sensitive     CIPROFLOXACIN <=0.25 SENSITIVE Sensitive     GENTAMICIN <=1 SENSITIVE Sensitive     IMIPENEM <=0.25 SENSITIVE Sensitive     NITROFURANTOIN <=16 SENSITIVE Sensitive     TRIMETH/SULFA <=20 SENSITIVE Sensitive     AMPICILLIN/SULBACTAM 16 INTERMEDIATE Intermediate     PIP/TAZO <=4 SENSITIVE Sensitive     * >=100,000 COLONIES/mL ESCHERICHIA COLI     Studies:              All Imaging reviewed and is as per above notation   Scheduled Meds: . amitriptyline  25 mg Oral Daily  . antiseptic oral rinse  7 mL Mouth Rinse q12n4p  . butalbital-acetaminophen-caffeine  1 tablet Oral Q4H  . chlorhexidine  15 mL Mouth Rinse BID  . enoxaparin (LOVENOX) injection  40 mg Subcutaneous Q24H  . nitrofurantoin (macrocrystal-monohydrate)  100 mg Oral Q12H   Continuous Infusions:     Assessment/Plan:   1. Sepsis secondary to pyelonephritis-nausea and vomiting 09/17/15- prescribed Phenergan. Placed back CLD-->soft diet.    vancomycin DC 11/11.transition Zosyn--> nitrofurantoin 11/30 .  Urine culture growing above 100,000 colony-forming units E coli.  sens  white count down from 17-13--->9-->6 repeat CBC a.m. .  Expect slower resolution. Change fluids sodium chloride 100 cc/hr + K . Saline lock IV 11/14.  Force oral fluids 2. Migraine headaches-start Fioricet 1 tab every 4 scheduled. Hold ibuprofen 600.  Continue tramadol every 6 when necessary  as well. May continue Flexeril 10 mg 3 times a day when necessary--due to increasing  headaches we have prescribed one or 2 doses of IV morphine 1-2 mg.   3. Altered LFTs-patient has a habitus of obesity .  she has been taking Fioricet in addition to Tylenol scheduled at home which may also contribute to elevated LFTs-see above discussion re-ultrasound  4. bipolar-continue Elavil 25 mg daily 5. Hypokalemia replace with IV fluid and recheck am labs--potassium resolved to 3.8.  Potassium now 4.3 6. Asthma-clinically stable with no new issues 7. Mitral prolapse-clinically stable 8. Morbid obesity-outpatient follow-up needed and weight loss counseling as well  Code Status: Full Family Communication: Discussed with the mother, husband at the bedside Disposition Plan: Inpatient-expect resolution and d/c in 24 hours if all stable   Verneita Griffes, MD  [facility] Pager [phone] 09/19/2015, 4:00 PM    LOS: 3 days     U yesterday

## 2015-09-20 ENCOUNTER — Encounter: Payer: Self-pay | Admitting: Family Medicine

## 2015-09-20 LAB — BASIC METABOLIC PANEL
ANION GAP: 8 (ref 5–15)
BUN: 16 mg/dL (ref 6–20)
CALCIUM: 8.9 mg/dL (ref 8.9–10.3)
CHLORIDE: 102 mmol/L (ref 101–111)
CO2: 27 mmol/L (ref 22–32)
Creatinine, Ser: 0.66 mg/dL (ref 0.44–1.00)
GFR calc non Af Amer: >60 mL/min (ref >60)
GLUCOSE: 90 mg/dL (ref 65–99)
Potassium: 4.2 mmol/L (ref 3.5–5.1)
Sodium: 137 mmol/L (ref 135–145)

## 2015-09-20 LAB — CBC WITH DIFFERENTIAL/PLATELET
BASOS PCT: 1 %
Basophils Absolute: 0.1 10*3/uL (ref 0.0–0.1)
EOS PCT: 8 %
Eosinophils Absolute: 0.4 10*3/uL (ref 0.0–0.7)
HEMATOCRIT: 31.8 % — AB (ref 36.0–46.0)
HEMOGLOBIN: 10.4 g/dL — AB (ref 12.0–15.0)
LYMPHS ABS: 1.5 10*3/uL (ref 0.7–4.0)
Lymphocytes Relative: 26 %
MCH: 27.4 pg (ref 26.0–34.0)
MCHC: 32.7 g/dL (ref 30.0–36.0)
MCV: 83.9 fL (ref 78.0–100.0)
MONO ABS: 0.9 10*3/uL (ref 0.1–1.0)
Monocytes Relative: 16 %
NEUTROS ABS: 2.7 10*3/uL (ref 1.7–7.7)
Neutrophils Relative %: 49 %
Platelets: 523 10*3/uL — ABNORMAL HIGH (ref 150–400)
RBC: 3.79 MIL/uL — ABNORMAL LOW (ref 3.87–5.11)
RDW: 15 % (ref 11.5–15.5)
WBC: 5.6 10*3/uL (ref 4.0–10.5)

## 2015-09-20 LAB — CULTURE, BLOOD (ROUTINE X 2)
Culture: NO GROWTH
Culture: NO GROWTH

## 2015-09-20 MED ORDER — CYCLOBENZAPRINE HCL 10 MG PO TABS: 10.0000 mg | ORAL_TABLET | Freq: Three times a day (TID) | ORAL | Status: DC | PRN

## 2015-09-20 MED ORDER — CHLORHEXIDINE GLUCONATE 0.12 % MT SOLN: 15.0000 mL | Freq: Two times a day (BID) | OROMUCOSAL | Status: DC

## 2015-09-20 MED ORDER — NITROFURANTOIN MONOHYD MACRO 100 MG PO CAPS: 100.0000 mg | ORAL_CAPSULE | Freq: Two times a day (BID) | ORAL | Status: DC

## 2015-09-20 MED ORDER — POLYETHYLENE GLYCOL 3350 17 G PO PACK: 17.0000 g | PACK | Freq: Every day | ORAL | Status: DC

## 2015-09-20 MED ORDER — POLYETHYLENE GLYCOL 3350 17 G PO PACK
17.0000 g | PACK | Freq: Once | ORAL | Status: DC
  Filled 2015-09-20: qty 1

## 2015-09-20 MED ORDER — TRAMADOL HCL 50 MG PO TABS: 50.0000 mg | ORAL_TABLET | Freq: Four times a day (QID) | ORAL | Status: DC | PRN

## 2015-09-20 MED ORDER — BUTALBITAL-APAP-CAFFEINE 50-325-40 MG PO TABS: 1.0000 | ORAL_TABLET | ORAL | Status: DC

## 2015-09-20 NOTE — Discharge Summary (Signed)
Physician Discharge Summary  [name] [facility] SMO:[id number] DOB: 1975/12/15 DOA: 09/15/2015  PCP: Antonietta Jewel, MD  Admit date: 09/15/2015 Discharge date: 09/20/2015  Time spent: 35 minutes  Recommendations for Outpatient Follow-up:  1. bmet + CBC 1 week 2. Limited Rx flexeril and Tramadol Rx 3. Needs to follow up with Migraine specialist and refilled fioricet for her   4. Finish Macrobid 100 bid 3 days from d/c on 09/23/15  Discharge Diagnoses:  Principal Problem:   Sepsis due Pyelonephritis   Active Problems:   Mitral valve disorder   Panic attacks   Hypertension   Scoliosis   Renal disorder   Asthma   Pyelonephritis   Discharge Condition: improved  Diet recommendation: hh low salt  Filed Weights   09/15/15 0531  Weight: 89.359 kg (197 lb)    History of present illness:  39 year old female, known history migraines, bipolar with panic attacks, childhood asthma, mitral valve prolapse, prior urinary tract infections, Morbid obesity, Body mass index is 34.91 kg/(m^2). palpitations and recent emergency room visits 09/05/15 with left calf pain and bruising, (negative CTA negative troponins) came to [facility] long emergency room high-grade fever which started Tuesday  Admitted with findings concerning for Sepsis-felt might have been 2/2 initially to FLU or Pyelonephritis as had relatives with similar c/o  Hospital Course:   1. Sepsis secondary to E.coli pyelonephritis-nausea and vomiting 09/17/15- prescribed Phenergan. Placed back CLD-->soft diet. vancomycin DC 11/11.transition Zosyn--> nitrofurantoin 11/30 . white count down from 17-13--->9-->6.  Resolved . Was on sodium chloride 100 cc/hr + K . Saline lock IV 11/14.tol PO and has met criterion for d/c home.  US abdomen showed no liver or kidney pathology 2. Migraine headaches-start Fioricet 1 tab every 4 scheduled. Hold ibuprofen 600. Continue tramadol every 6 when necessary as well. May continue Flexeril 10 mg 3 times a  day when necessary--due to increasing headaches we have prescribed one or 2 doses of IV morphine 1-2 mg.  3. Altered LFTs-patient has a habitus of obesity . she has been taking Fioricet in addition to Tylenol scheduled at home which may also contribute to elevated LFTs-see above discussion re-ultrasound  4. bipolar-continue Elavil 25 mg daily 5. Hypokalemia replace with IV fluid and recheck am labs--potassium resolved to 3.8. Potassium 4.2 on d/c.  Rpt labs as OP 6. Asthma-clinically stable with no new issues 7. Mitral prolapse-clinically stable 8. Morbid obesity-outpatient follow-up needed and weight loss counseling as well   Discharge Exam: Filed Vitals:   09/20/15 0704  BP: 113/77  Pulse: 64  Temp: 98.4 F (36.9 C)  Resp: 17   improved Has had 3 full meals so far No significant n/v tells nurse consitpated No f/chill/n/v  General: eomi ncat no pallor no ict Cardiovascular: s1 s2 no m/r/g Respiratory: clear no added sound  Discharge Instructions   Discharge Instructions    Diet - low sodium heart healthy    Complete by:  As directed      Discharge instructions    Complete by:  As directed   Please follow up with your regular MD in 1-2 weeks Do not take Pseudophed, tyleol and Fioricet together as they contain tylenol Do not take Aspirin with Fioricet Get labs in ~ 1 wek from your priary provider's office Complete the fuull course of antibiotics as Rx without missing any doses I have refilled your cyclobenzaprine for spasm, and given you a small Rx for tramadol in case your pain is not resolved These cannot be called in so please fill them  at your phamracy     Increase activity slowly    Complete by:  As directed           Current Discharge Medication List    START taking these medications   Details  butalbital-acetaminophen-caffeine (FIORICET, ESGIC) 50-325-40 MG tablet Take 1 tablet by mouth every 4 (four) hours. Qty: 14 tablet, Refills: 0    chlorhexidine  (PERIDEX) 0.12 % solution 15 mLs by Mouth Rinse route 2 (two) times daily. Qty: 120 mL, Refills: 0    nitrofurantoin, macrocrystal-monohydrate, (MACROBID) 100 MG capsule Take 1 capsule (100 mg total) by mouth every 12 (twelve) hours. Qty: 6 capsule, Refills: 0      CONTINUE these medications which have CHANGED   Details  cyclobenzaprine (FLEXERIL) 10 MG tablet Take 1 tablet (10 mg total) by mouth 3 (three) times daily as needed for muscle spasms. Qty: 20 tablet, Refills: 0    traMADol (ULTRAM) 50 MG tablet Take 1 tablet (50 mg total) by mouth every 6 (six) hours as needed. Qty: 15 tablet, Refills: 0      CONTINUE these medications which have NOT CHANGED   Details  ibuprofen (ADVIL,MOTRIN) 200 MG tablet Take 2 tablets (400 mg total) by mouth every 6 (six) hours as needed for headache or moderate pain. Qty: 20 tablet, Refills: 0    amitriptyline (ELAVIL) 25 MG tablet Take 1 tablet by mouth daily.    aspirin 81 MG chewable tablet Chew 81 mg by mouth daily.    Multiple Vitamin (MULTIVITAMIN WITH MINERALS) TABS tablet Take 1 tablet by mouth daily.    Omega-3 Fatty Acids (FISH OIL) 1000 MG CAPS Take 1 capsule by mouth daily.    OVER THE COUNTER MEDICATION Take 2 tablets by mouth 4 (four) times daily as needed (focus). "Focus Fast" or "Fast Focus" otc herbal supplement      STOP taking these medications     Pseudoephedrine-APAP-DM (TYLENOL COLD/FLU SEVERE DAY PO)      nitroGLYCERIN (NITROSTAT) 0.4 MG SL tablet        Allergies  Allergen Reactions  . Sulfonamide Derivatives Anaphylaxis and Rash    Patient was hospitalized  . Ciprofloxacin Itching    Benadryl does not help  . Norvasc [Amlodipine] Swelling  . Amlodipine Besylate Rash      The results of significant diagnostics from this hospitalization (including imaging, microbiology, ancillary and laboratory) are listed below for reference.    Significant Diagnostic Studies: Dg Chest 2 View  09/15/2015  CLINICAL DATA:   Sepsis protocol.  Fever and chills with body aches EXAM: CHEST  2 VIEW COMPARISON:  09/05/2015 FINDINGS: Normal heart size and mediastinal contours. No acute infiltrate or edema. No effusion or pneumothorax. Thoracic dextroscoliosis. No acute osseous findings. Cholecystectomy clips. IMPRESSION: Negative.  No pneumonia. Electronically Signed   By: Monte Fantasia M.D.   On: 09/15/2015 06:03   Dg Chest 2 View  09/05/2015  CLINICAL DATA:  New onset mid to LEFT chest pain with shortness of breath this morning, history asthma EXAM: CHEST  2 VIEW COMPARISON:  08/24/2015 FINDINGS: Normal heart size, mediastinal contours, and pulmonary vascularity. Lungs clear. No pleural effusion or pneumothorax. Biconvex thoracolumbar scoliosis. IMPRESSION: No acute abnormalities. Electronically Signed   By: Lavonia Dana M.D.   On: 09/05/2015 11:19   Dg Chest 2 View  08/24/2015  CLINICAL DATA:  Posterior right mid rib pain for 2 months. No reported injury. EXAM: CHEST  2 VIEW COMPARISON:  06/09/2013 chest radiograph FINDINGS: Top-normal heart size,  slightly increased. Stable normal mediastinal silhouette. No pneumothorax. No pleural effusion. Clear lungs, with no focal lung consolidation and no pulmonary edema. Mild dextroscoliosis of the thoracic spine, slightly increased. Surgical clips are seen in the right upper quadrant of the abdomen. IMPRESSION: Top-normal heart size, slightly increased. No pulmonary edema. Clear lungs. Electronically Signed   By: Ilona Sorrel M.D.   On: 08/24/2015 12:11   Ct Head Wo Contrast  09/15/2015  CLINICAL DATA:  Fever, headache, nausea and vomiting for 2 days. Initial encounter. EXAM: CT HEAD WITHOUT CONTRAST TECHNIQUE: Contiguous axial images were obtained from the base of the skull through the vertex without intravenous contrast. COMPARISON:  Brain MRI and head CT scan 10/05/2013. FINDINGS: No evidence of acute intracranial abnormality including hemorrhage, infarct, mass lesion, mass effect,  midline shift or abnormal extra-axial fluid collection. Small cyst at the superior vermis with an associated irregular appearance of the vermis is unchanged. Imaged paranasal sinuses and mastoid air cells are clear. No hydrocephalus or pneumocephalus. The calvarium is intact. IMPRESSION: No acute abnormality.  Stable compared to prior exam. Electronically Signed   By: Inge Rise M.D.   On: 09/15/2015 07:11   Ct Angio Chest Pe W/cm &/or Wo Cm  09/05/2015  CLINICAL DATA:  Chest and left calf pain for the past 3 days. Left calf bruising since yesterday. Some shortness of breath this morning. EXAM: CT ANGIOGRAPHY CHEST WITH CONTRAST TECHNIQUE: Multidetector CT imaging of the chest was performed using the standard protocol during bolus administration of intravenous contrast. Multiplanar CT image reconstructions and MIPs were obtained to evaluate the vascular anatomy. CONTRAST:  173m OMNIPAQUE IOHEXOL 350 MG/ML SOLN COMPARISON:  Chest radiographs obtained earlier today. Chest CTA dated 08/30/2011. FINDINGS: Normally opacified pulmonary arteries with no pulmonary arterial filling defects seen. Minimal bilateral dependent atelectasis. Otherwise, clear lungs. No lung nodules or enlarged lymph nodes. Mild thoracic spine degenerative changes. Moderate dextroconvex thoracic scoliosis is stable. Unremarkable upper abdomen. Review of the MIP images confirms the above findings. IMPRESSION: No acute abnormality.  No pulmonary emboli seen. Electronically Signed   By: SClaudie ReveringM.D.   On: 09/05/2015 14:47   UKoreaAbdomen Complete  09/17/2015  CLINICAL DATA:  Nausea and vomiting. Flu-like symptoms with elevated AST/ ALT. Morbid obesity. EXAM: ULTRASOUND ABDOMEN COMPLETE COMPARISON:  CT 12/22/2008 FINDINGS: Gallbladder: Previous cholecystectomy. Common bile duct: Diameter: 4 mm. Liver: No focal lesion identified. Within normal limits in parenchymal echogenicity. IVC: No abnormality visualized. Pancreas: Visualized  portion unremarkable. Spleen: Size and appearance within normal limits. Right Kidney: Length: 13.4 cm. Echogenicity within normal limits. No mass or hydronephrosis visualized. Left Kidney: Length: 12.3 cm. Echogenicity within normal limits. No mass or hydronephrosis visualized. Abdominal aorta: No aneurysm visualized. Other findings: None. IMPRESSION: Prior cholecystectomy.  No acute hepatobiliary findings. Electronically Signed   By: DMarin OlpM.D.   On: 09/17/2015 17:56    Microbiology: Recent Results (from the past 240 hour(s))  Culture, blood (routine x 2)     Status: None (Preliminary result)   Collection Time: 09/15/15  5:35 AM  Result Value Ref Range Status   Specimen Description BLOOD RIGHT HAND  Final   Special Requests BOTTLES DRAWN AEROBIC AND ANAEROBIC 5ML  Final   Culture   Final    NO GROWTH 4 DAYS [facility]   Report Status PENDING  Incomplete  Culture, blood (routine x 2)     Status: None (Preliminary result)   Collection Time: 09/15/15  5:45 AM  Result Value Ref Range Status   Specimen Description BLOOD LEFT HAND  Final   Special Requests BOTTLES DRAWN AEROBIC AND ANAEROBIC 5ML  Final   Culture   Final    NO GROWTH 4 DAYS Performed at [facility]    Report Status PENDING  Incomplete  Urine culture     Status: None   Collection Time: 09/15/15  6:26 AM  Result Value Ref Range Status   Specimen Description URINE, CLEAN CATCH  Final   Special Requests NONE  Final   Culture   Final    >=100,000 COLONIES/mL ESCHERICHIA COLI Performed at [facility]    Report Status 09/17/2015 FINAL  Final   Organism ID, Bacteria ESCHERICHIA COLI  Final      Susceptibility   Escherichia coli - MIC*    AMPICILLIN >=32 RESISTANT Resistant     CEFAZOLIN <=4 SENSITIVE Sensitive     CEFTRIAXONE <=1 SENSITIVE Sensitive     CIPROFLOXACIN <=0.25 SENSITIVE Sensitive     GENTAMICIN <=1 SENSITIVE Sensitive     IMIPENEM <=0.25 SENSITIVE Sensitive      NITROFURANTOIN <=16 SENSITIVE Sensitive     TRIMETH/SULFA <=20 SENSITIVE Sensitive     AMPICILLIN/SULBACTAM 16 INTERMEDIATE Intermediate     PIP/TAZO <=4 SENSITIVE Sensitive     * >=100,000 COLONIES/mL ESCHERICHIA COLI     Labs: Basic Metabolic Panel:  Recent Labs Lab 09/16/15 0550 09/17/15 0517 09/18/15 0555 09/19/15 0510 09/20/15 0538  NA 139 136 137 137 137  K 3.5 3.0* 3.8 4.3 4.2  CL 110 104 106 105 102  CO2 23 25 26 25 27   GLUCOSE 95 124* 91 99 90  BUN 6 8 <5* 8 16  CREATININE 0.59 0.77 0.59 0.62 0.66  CALCIUM 7.7* 7.9* 8.3* 8.7* 8.9   Liver Function Tests:  Recent Labs Lab 09/15/15 0545 09/16/15 0550 09/17/15 0517 09/18/15 0555 09/19/15 0510  AST 61* 60* 42* 23 31  ALT 80* 104* 90* 68* 67*  ALKPHOS 58 66 69 61 68  BILITOT 0.7 0.7 0.2* 0.6 0.3  PROT 6.5 5.8* 6.0* 6.5 6.8  ALBUMIN 3.0* 2.6* 2.6* 2.7* 3.0*    Recent Labs Lab 09/17/15 0517  LIPASE 19   No results for input(s): AMMONIA in the last 168 hours. CBC:  Recent Labs Lab 09/15/15 0545 09/16/15 0550 09/17/15 0517 09/18/15 0555 09/20/15 0538  WBC 17.5* 10.3 9.8 6.6 5.6  NEUTROABS 13.3*  --   --  3.2 2.7  HGB 10.5* 9.2* 9.0* 9.3* 10.4*  HCT 31.2* 27.9* 26.7* 28.9* 31.8*  MCV 82.1 84.5 82.9 84.5 83.9  PLT 328 302 321 432* 523*   Cardiac Enzymes: No results for input(s): CKTOTAL, CKMB, CKMBINDEX, TROPONINI in the last 168 hours. BNP: BNP (last 3 results)  Recent Labs  09/05/15 1131  BNP 10.2    ProBNP (last 3 results) No results for input(s): PROBNP in the last 8760 hours.  CBG: No results for input(s): GLUCAP in the last 168 hours.     SignedNita Sells  [facility] 09/20/2015, 8:14 AM

## 2016-01-02 NOTE — H&P (Signed)
[name] is a 40 y.o. female here for Saline Infusion Ultrasound . However, pelvic pain prevented these procedures today. Pelvic pain: Hx of ovarian cysts that she associates with pelvic pain  AUB- undetermined cause, likely ovarian. TVUS normal, hx of D&C with resolution of bleeding sx.  Entry pelvic pain: present for several months, limiting exams today  Past Medical History:  has a past medical history of Asthma without status asthmaticus; Depression, unspecified; Fibromyalgia; Heart disease; Migraine headache; Migraine with vertigo; and Scoliosis.  Past Surgical History:  has a past surgical history that includes Tubal ligation (1998); Cholecystectomy; ovarian laperoscopy; Right knee arthroscopy with chondroplasty and alteral release as well as open distal tuberle osteotomy and medial patellofemoral ligament imbrication (05-28-12); and Cholecystectomy. Family History: family history includes Alcohol abuse in her father; Arthritis in her maternal aunt; Asthma in her mother; Breast cancer in her other; Coronary artery disease in her mother; Depression in her father and mother; Diabetes mellitus in her mother; Heart disease in her mother and other; Hypertension in her father and mother; Kidney disease in her other; Lung cancer in her other; Osteoarthritis in her mother; Schizophrenia in her father; Sickle cell anemia in her mother; Stroke in her father, mother, and other. Social History:  reports that she has never smoked. She has never used smokeless tobacco. She reports that she does not drink alcohol or use illicit drugs. OB/GYN History:     OB History    No data available      Allergies: is allergic to sulfabenzamide; ciprofloxacin; norvasc [amlodipine]; and sulfa (sulfonamide antibiotics). Medications:  Current Outpatient Prescriptions:  . ascorbic acid (VITAMIN C) 500 MG tablet, Take 500 mg by mouth once daily., Disp: , Rfl:  . BIOTIN ORAL, Take by mouth., Disp: , Rfl:  .  bisacodyl (DULCOLAX) 5 mg EC tablet, Take 5 mg by mouth once daily as needed for Constipation., Disp: , Rfl:  . butalbital-acetaminophen-caffeine (FIORICET) 50-325-40 mg tablet, TK 1 T PO Q 4 H, Disp: , Rfl: 0 . clindamycin (CLEOCIN T) 1 % topical solution, Affected areas hands and feet (Patient not taking: Reported on 11/30/2015 ), Disp: 60 mL, Rfl: 2 . clotrimazole-betamethasone (LOTRISONE) 1-0.05 % cream, Apply topically 2 (two) times daily. To left foot and between toes (Patient not taking: Reported on 11/30/2015 ), Disp: 45 g, Rfl: 3 . cyclobenzaprine (FLEXERIL) 10 MG tablet, Take 10 mg by mouth., Disp: , Rfl:  . gabapentin (NEURONTIN) 300 MG capsule, Take 3 capsules (900 mg total) by mouth nightly., Disp: 90 capsule, Rfl: 3 . meclizine (ANTIVERT) 25 mg tablet, Take 1 tablet (25 mg total) by mouth 4 (four) times daily as needed for Dizziness., Disp: 30 tablet, Rfl: 3 . meloxicam (MOBIC) 15 MG tablet, Take 0.5 tablets (7.5 mg total) by mouth once daily., Disp: 30 tablet, Rfl: 0 . multivitamin with iron-minerals (VITAMINS AND MINERALS) tablet, Take by mouth. Reported on 11/30/2015 , Disp: , Rfl:  . NAPROXEN ORAL, Take 1 tablet by mouth continuously as needed., Disp: , Rfl:  . nitroGLYcerin (NITROSTAT) 0.4 MG SL tablet, Place under the tongue., Disp: , Rfl:  . omega-3 fatty acids/fish oil (FISH OIL) 340-1,000 mg capsule, Take by mouth., Disp: , Rfl:  . polyethylene glycol (MIRALAX) packet, Take by mouth., Disp: , Rfl:    Review of Systems: General:   No fatigue or weight loss Eyes:   No vision changes Ears:   No hearing difficulty Respiratory:   No cough or shortness of breath Pulmonary:   No asthma  or shortness of breath Cardiovascular:  No chest pain, palpitations, dyspnea on exertion Gastrointestinal:  No abdominal bloating, chronic diarrhea, constipations, masses, pain or hematochezia Genitourinary:  No hematuria, dysuria, abnormal vaginal discharge, +pelvic pain,  +Menometrorrhagia Lymphatic:  No swollen lymph nodes Musculoskeletal: No muscle weakness Neurologic:  No extremity weakness, syncope, seizure disorder Psychiatric:  No history of depression, delusions or suicidal/homicidal ideation   Exam:      Vitals:   12/26/15 1543  BP: 142/83  Pulse: 87   Body mass index is 35.91 kg/(m^2).  WDWN black female in NAD Lungs: CTA  CV : RRR without murmur  HEENT: Trachea midline, no lymphadenopathy Breast: deferred Neck: no thyromegaly Abdomen: soft , no mass, normal active bowel sounds, non-tender, no rebound tenderness Skin: No rashes, ulcers or skin lesions noted. No excessive hirsutism or acne noted.  Neurological: Appears alert and oriented and is a good historian. No gross abnormalities are noted. Psychological: Normal affect and mood. No signs of anxiety or depression noted.   Pelvic:  External genitalia: vulva /labia no lesions, Tanner stage 5 Urethra: no prolapse Vagina: normal physiologic d/c Cervix: no lesions, no cervical motion tenderness  Uterus: normal size shape and contour, non-tender Adnexa: no mass, non-tender  Rectovaginal: external exam normal    Impression:   The primary encounter diagnosis was Pelvic pain in female. A diagnosis of Abnormal uterine bleeding (AUB) was also pertinent to this visit.    Plan:   -  Preoperative visit: D&C hysteroscopy, Embx, pap smear. Consents signed today. After discussion of the options, including endometrial ablation and hysterectomy to treat AUB/pelvic pain, we have decided on a dx and tx route of D&C, hysteroscopy with embx and pap smear at that time, as pain limits all speculum exams in the office.   Risks of surgery were discussed with the patient including but not limited to: bleeding which may require transfusion; infection which may require antibiotics; injury to uterus or surrounding organs; intrauterine scarring which may impair future fertility; need for  additional procedures including laparotomy or laparoscopy; and other postoperative/anesthesia complications. Written informed consent was obtained.  This is a scheduled same-day surgery. If her pelvic pain is triggered significantly, she is given the option of staying overnight for pain control She will have a postop visit in 2 weeks to review operative findings and pathology.  Today we tried to performed an Endometrial biopsy with planned saline sonogram= both were aborted due to pain.  We did discuss trialing an endometrial ablation with hysteroscopy and EMBx the same day, but patient desires D&C as she had resolution of her menorrhagia after this procedure in the past. She is aware that D&C is diagnostic and not truly therapeutic and that further procedures, such as ablation or hysterectomy, may be needed in the future. Hysterectomy was discussed as a possible tx for pelvic pain, and we will try conservative management first.   I have also ordered vaginal valium and pelvic floor physical therapy to treat vaginal muscle spasms prior to and after the surgery..  60 min spent with the patient, with >50% spent in counseling and clinical decision making

## 2016-01-03 ENCOUNTER — Encounter
Admission: RE | Admit: 2016-01-03 | Discharge: 2016-01-03 | Disposition: A | Discharge status: Home / Self Care | Source: Ambulatory Visit | Attending: Obstetrics and Gynecology | Admitting: Obstetrics and Gynecology

## 2016-01-03 DIAGNOSIS — Z01810 Encounter for preprocedural cardiovascular examination: Secondary | ICD-10-CM | POA: Insufficient documentation

## 2016-01-03 DIAGNOSIS — Z01812 Encounter for preprocedural laboratory examination: Secondary | ICD-10-CM | POA: Diagnosis present

## 2016-01-03 HISTORY — DX: Palpitations: R00.2

## 2016-01-03 LAB — CBC
HCT: 35.4 % (ref 35.0–47.0)
Hemoglobin: 11.7 g/dL — ABNORMAL LOW (ref 12.0–16.0)
MCH: 26.8 pg (ref 26.0–34.0)
MCHC: 32.9 g/dL (ref 32.0–36.0)
MCV: 81.3 fL (ref 80.0–100.0)
PLATELETS: 425 10*3/uL (ref 150–440)
RBC: 4.36 MIL/uL (ref 3.80–5.20)
RDW: 15.6 % — ABNORMAL HIGH (ref 11.5–14.5)
WBC: 4.5 10*3/uL (ref 3.6–11.0)

## 2016-01-03 LAB — BASIC METABOLIC PANEL
Anion gap: 6 (ref 5–15)
BUN: 10 mg/dL (ref 6–20)
CHLORIDE: 106 mmol/L (ref 101–111)
CO2: 27 mmol/L (ref 22–32)
CREATININE: 0.51 mg/dL (ref 0.44–1.00)
Calcium: 8.6 mg/dL — ABNORMAL LOW (ref 8.9–10.3)
GFR calc non Af Amer: >60 mL/min (ref >60)
Glucose, Bld: 75 mg/dL (ref 65–99)
POTASSIUM: 3.6 mmol/L (ref 3.5–5.1)
SODIUM: 139 mmol/L (ref 135–145)

## 2016-01-03 LAB — TYPE AND SCREEN
ABO/RH(D): O POS
ANTIBODY SCREEN: NEGATIVE

## 2016-01-03 LAB — ABO/RH: ABO/RH(D): O POS

## 2016-01-03 NOTE — Patient Instructions (Signed)
  Your procedure is scheduled on: Monday 01/09/16 Report to Day Surgery. 2ND FLOOR MEDICAL MALL ENTRANCE To find out your arrival time please call [phone] between 1PM - 3PM on Friday 01/06/16.  Remember: Instructions that are not followed completely may result in serious medical risk, up to and including death, or upon the discretion of your surgeon and anesthesiologist your surgery may need to be rescheduled.    __X__ 1. Do not eat food or drink liquids after midnight. No gum chewing or hard candies.     __X__ 2. No Alcohol for 24 hours before or after surgery.   ____ 3. Bring all medications with you on the day of surgery if instructed.    __X__ 4. Notify your doctor if there is any change in your medical condition     (cold, fever, infections).     Do not wear jewelry, make-up, hairpins, clips or nail polish.  Do not wear lotions, powders, or perfumes. You may wear deodorant.  Do not shave 48 hours prior to surgery. Men may shave face and neck.  Do not bring valuables to the hospital.    [facility] is not responsible for any belongings or valuables.               Contacts, dentures or bridgework may not be worn into surgery.  Leave your suitcase in the car. After surgery it may be brought to your room.  For patients admitted to the hospital, discharge time is determined by your                treatment team.   Patients discharged the day of surgery will not be allowed to drive home.   Please read over the following fact sheets that you were given:   Surgical Site Infection Prevention   __X__ Take these medicines the morning of surgery with A SIP OF WATER:    1. NONE  2.   3.   4.  5.  6.  ____ Fleet Enema (as directed)   ____ Use CHG Soap as directed  ____ Use inhalers on the day of surgery  ____ Stop metformin 2 days prior to surgery    ____ Take 1/2 of usual insulin dose the night before surgery and none on the morning of surgery.   __X__ Stop  Coumadin/Plavix/aspirin on TODAY  __X__ Stop Anti-inflammatories on TODAY     (MELOXICAM, IBUPROFEN)   __X__ Stop supplements until after surgery.    ____ Bring C-Pap to the hospital.

## 2016-01-04 NOTE — Pre-Procedure Instructions (Signed)
EKG sent to anesthesia for review

## 2016-01-09 ENCOUNTER — Ambulatory Visit: Admitting: Anesthesiology

## 2016-01-09 ENCOUNTER — Encounter: Payer: Self-pay | Admitting: *Deleted

## 2016-01-09 ENCOUNTER — Encounter
Admission: RE | Disposition: A | Discharge status: Home / Self Care | Payer: Self-pay | Source: Ambulatory Visit | Attending: Obstetrics and Gynecology

## 2016-01-09 ENCOUNTER — Observation Stay
Admission: RE | Admit: 2016-01-09 | Discharge: 2016-01-10 | Disposition: A | Discharge status: Home / Self Care | Source: Ambulatory Visit | Attending: Obstetrics and Gynecology | Admitting: Obstetrics and Gynecology

## 2016-01-09 DIAGNOSIS — N3010 Interstitial cystitis (chronic) without hematuria: Secondary | ICD-10-CM | POA: Diagnosis not present

## 2016-01-09 DIAGNOSIS — Z818 Family history of other mental and behavioral disorders: Secondary | ICD-10-CM | POA: Insufficient documentation

## 2016-01-09 DIAGNOSIS — Z9049 Acquired absence of other specified parts of digestive tract: Secondary | ICD-10-CM | POA: Insufficient documentation

## 2016-01-09 DIAGNOSIS — Z79899 Other long term (current) drug therapy: Secondary | ICD-10-CM | POA: Diagnosis not present

## 2016-01-09 DIAGNOSIS — Z825 Family history of asthma and other chronic lower respiratory diseases: Secondary | ICD-10-CM | POA: Insufficient documentation

## 2016-01-09 DIAGNOSIS — Z888 Allergy status to other drugs, medicaments and biological substances status: Secondary | ICD-10-CM | POA: Diagnosis not present

## 2016-01-09 DIAGNOSIS — G43909 Migraine, unspecified, not intractable, without status migrainosus: Secondary | ICD-10-CM | POA: Insufficient documentation

## 2016-01-09 DIAGNOSIS — Z791 Long term (current) use of non-steroidal anti-inflammatories (NSAID): Secondary | ICD-10-CM | POA: Insufficient documentation

## 2016-01-09 DIAGNOSIS — Z823 Family history of stroke: Secondary | ICD-10-CM | POA: Insufficient documentation

## 2016-01-09 DIAGNOSIS — M419 Scoliosis, unspecified: Secondary | ICD-10-CM | POA: Diagnosis not present

## 2016-01-09 DIAGNOSIS — R102 Pelvic and perineal pain: Secondary | ICD-10-CM | POA: Diagnosis present

## 2016-01-09 DIAGNOSIS — Z832 Family history of diseases of the blood and blood-forming organs and certain disorders involving the immune mechanism: Secondary | ICD-10-CM | POA: Diagnosis not present

## 2016-01-09 DIAGNOSIS — Z811 Family history of alcohol abuse and dependence: Secondary | ICD-10-CM | POA: Diagnosis not present

## 2016-01-09 DIAGNOSIS — J45909 Unspecified asthma, uncomplicated: Secondary | ICD-10-CM | POA: Diagnosis not present

## 2016-01-09 DIAGNOSIS — Z8261 Family history of arthritis: Secondary | ICD-10-CM | POA: Insufficient documentation

## 2016-01-09 DIAGNOSIS — Z881 Allergy status to other antibiotic agents status: Secondary | ICD-10-CM | POA: Diagnosis not present

## 2016-01-09 DIAGNOSIS — M797 Fibromyalgia: Secondary | ICD-10-CM | POA: Diagnosis not present

## 2016-01-09 DIAGNOSIS — I519 Heart disease, unspecified: Secondary | ICD-10-CM | POA: Insufficient documentation

## 2016-01-09 DIAGNOSIS — N840 Polyp of corpus uteri: Secondary | ICD-10-CM | POA: Diagnosis not present

## 2016-01-09 DIAGNOSIS — Z801 Family history of malignant neoplasm of trachea, bronchus and lung: Secondary | ICD-10-CM | POA: Diagnosis not present

## 2016-01-09 DIAGNOSIS — N920 Excessive and frequent menstruation with regular cycle: Secondary | ICD-10-CM | POA: Insufficient documentation

## 2016-01-09 DIAGNOSIS — Z882 Allergy status to sulfonamides status: Secondary | ICD-10-CM | POA: Diagnosis not present

## 2016-01-09 DIAGNOSIS — N939 Abnormal uterine and vaginal bleeding, unspecified: Secondary | ICD-10-CM | POA: Insufficient documentation

## 2016-01-09 DIAGNOSIS — Z841 Family history of disorders of kidney and ureter: Secondary | ICD-10-CM | POA: Insufficient documentation

## 2016-01-09 DIAGNOSIS — R112 Nausea with vomiting, unspecified: Secondary | ICD-10-CM | POA: Insufficient documentation

## 2016-01-09 DIAGNOSIS — Z8249 Family history of ischemic heart disease and other diseases of the circulatory system: Secondary | ICD-10-CM | POA: Insufficient documentation

## 2016-01-09 HISTORY — PX: CYSTOSCOPY: SHX5120

## 2016-01-09 HISTORY — PX: HYSTEROSCOPY W/D&C: SHX1775

## 2016-01-09 LAB — POCT PREGNANCY, URINE: PREG TEST UR: NEGATIVE

## 2016-01-09 SURGERY — DILATATION AND CURETTAGE /HYSTEROSCOPY
Anesthesia: General

## 2016-01-09 MED ORDER — ONDANSETRON HCL 4 MG/2ML IJ SOLN
INTRAMUSCULAR | Status: AC
  Administered 2016-01-09: 4 mg via INTRAVENOUS
  Filled 2016-01-09: qty 2

## 2016-01-09 MED ORDER — MENTHOL 3 MG MT LOZG
1.0000 | LOZENGE | OROMUCOSAL | Status: DC | PRN
  Administered 2016-01-09: 3 mg via ORAL
  Filled 2016-01-09: qty 9

## 2016-01-09 MED ORDER — SCOPOLAMINE 1 MG/3DAYS TD PT72
1.0000 | MEDICATED_PATCH | Freq: Once | TRANSDERMAL | Status: DC
  Administered 2016-01-09: 1.5 mg via TRANSDERMAL

## 2016-01-09 MED ORDER — LACTATED RINGERS IV SOLN
INTRAVENOUS | Status: DC
  Administered 2016-01-09: 07:00:00 via INTRAVENOUS

## 2016-01-09 MED ORDER — FAMOTIDINE 20 MG PO TABS
20.0000 mg | ORAL_TABLET | Freq: Once | ORAL | Status: AC
  Administered 2016-01-09: 20 mg via ORAL

## 2016-01-09 MED ORDER — ONDANSETRON HCL 4 MG PO TABS
4.0000 mg | ORAL_TABLET | Freq: Four times a day (QID) | ORAL | Status: DC | PRN
  Administered 2016-01-09: 4 mg via ORAL
  Filled 2016-01-09: qty 1

## 2016-01-09 MED ORDER — FENTANYL CITRATE (PF) 100 MCG/2ML IJ SOLN
INTRAMUSCULAR | Status: DC | PRN
Start: 2016-01-09 — End: 2016-01-09
  Administered 2016-01-09: 50 ug via INTRAVENOUS

## 2016-01-09 MED ORDER — DIPHENHYDRAMINE HCL 50 MG/ML IJ SOLN
INTRAMUSCULAR | Status: AC
  Filled 2016-01-09: qty 1

## 2016-01-09 MED ORDER — ONDANSETRON HCL 4 MG/2ML IJ SOLN: 4.0000 mg | Freq: Four times a day (QID) | INTRAMUSCULAR | Status: DC | PRN

## 2016-01-09 MED ORDER — POLYETHYLENE GLYCOL 3350 17 G PO PACK: 17.0000 g | PACK | Freq: Every day | ORAL | Status: DC

## 2016-01-09 MED ORDER — MELOXICAM 7.5 MG PO TABS
7.5000 mg | ORAL_TABLET | Freq: Every day | ORAL | Status: DC
  Administered 2016-01-09 – 2016-01-10 (×2): 7.5 mg via ORAL
  Filled 2016-01-09 (×3): qty 1

## 2016-01-09 MED ORDER — CHLORHEXIDINE GLUCONATE 0.12 % MT SOLN
15.0000 mL | Freq: Two times a day (BID) | OROMUCOSAL | Status: DC
  Filled 2016-01-09 (×4): qty 15

## 2016-01-09 MED ORDER — TRAMADOL HCL 50 MG PO TABS: 50.0000 mg | ORAL_TABLET | Freq: Four times a day (QID) | ORAL | Status: DC | PRN

## 2016-01-09 MED ORDER — OXYCODONE-ACETAMINOPHEN 5-325 MG PO TABS
1.0000 | ORAL_TABLET | ORAL | Status: DC | PRN
  Administered 2016-01-09: 1 via ORAL
  Administered 2016-01-10 (×2): 2 via ORAL
  Filled 2016-01-09: qty 1
  Filled 2016-01-09 (×2): qty 2

## 2016-01-09 MED ORDER — FENTANYL CITRATE (PF) 100 MCG/2ML IJ SOLN
INTRAMUSCULAR | Status: AC
Start: 2016-01-09 — End: 2016-01-09
  Administered 2016-01-09: 25 ug via INTRAVENOUS
  Filled 2016-01-09: qty 2

## 2016-01-09 MED ORDER — MIDAZOLAM HCL 2 MG/2ML IJ SOLN
INTRAMUSCULAR | Status: DC | PRN
  Administered 2016-01-09: 2 mg via INTRAVENOUS

## 2016-01-09 MED ORDER — FAMOTIDINE 20 MG PO TABS
ORAL_TABLET | ORAL | Status: AC
  Administered 2016-01-09: 20 mg via ORAL
  Filled 2016-01-09: qty 1

## 2016-01-09 MED ORDER — ASPIRIN 81 MG PO CHEW: 81.0000 mg | CHEWABLE_TABLET | Freq: Every day | ORAL | Status: DC

## 2016-01-09 MED ORDER — ONDANSETRON HCL 4 MG/2ML IJ SOLN
INTRAMUSCULAR | Status: DC | PRN
  Administered 2016-01-09: 4 mg via INTRAVENOUS

## 2016-01-09 MED ORDER — FENTANYL CITRATE (PF) 100 MCG/2ML IJ SOLN
25.0000 ug | INTRAMUSCULAR | Status: DC | PRN
  Administered 2016-01-09 (×4): 25 ug via INTRAVENOUS

## 2016-01-09 MED ORDER — DIPHENHYDRAMINE HCL 25 MG PO CAPS
25.0000 mg | ORAL_CAPSULE | Freq: Four times a day (QID) | ORAL | Status: DC | PRN
Start: 2016-01-09 — End: 2016-01-10
  Administered 2016-01-09 – 2016-01-10 (×2): 25 mg via ORAL
  Filled 2016-01-09 (×2): qty 1

## 2016-01-09 MED ORDER — SCOPOLAMINE 1 MG/3DAYS TD PT72
MEDICATED_PATCH | TRANSDERMAL | Status: DC
Start: 2016-01-09 — End: 2016-01-10
  Administered 2016-01-09: 1.5 mg via TRANSDERMAL
  Filled 2016-01-09: qty 1

## 2016-01-09 MED ORDER — MECLIZINE HCL 25 MG PO TABS
25.0000 mg | ORAL_TABLET | Freq: Four times a day (QID) | ORAL | Status: DC | PRN
  Filled 2016-01-09: qty 1

## 2016-01-09 MED ORDER — DIPHENHYDRAMINE HCL 50 MG/ML IJ SOLN
12.5000 mg | Freq: Once | INTRAMUSCULAR | Status: AC
  Administered 2016-01-09: 12.5 mg via INTRAVENOUS

## 2016-01-09 MED ORDER — PROMETHAZINE HCL 25 MG RE SUPP: 25.0000 mg | Freq: Four times a day (QID) | RECTAL | Status: DC | PRN

## 2016-01-09 MED ORDER — OMEGA-3-ACID ETHYL ESTERS 1 G PO CAPS
1.0000 | ORAL_CAPSULE | Freq: Every day | ORAL | Status: DC
  Administered 2016-01-09 – 2016-01-10 (×2): 1 g via ORAL
  Filled 2016-01-09 (×3): qty 1

## 2016-01-09 MED ORDER — AMITRIPTYLINE HCL 25 MG PO TABS
25.0000 mg | ORAL_TABLET | Freq: Every day | ORAL | Status: DC
  Filled 2016-01-09: qty 1

## 2016-01-09 MED ORDER — LACTATED RINGERS IV SOLN
INTRAVENOUS | Status: DC
  Administered 2016-01-09 (×2): via INTRAVENOUS

## 2016-01-09 MED ORDER — CYCLOBENZAPRINE HCL 10 MG PO TABS: 10.0000 mg | ORAL_TABLET | Freq: Three times a day (TID) | ORAL | Status: DC | PRN

## 2016-01-09 MED ORDER — HYDROMORPHONE HCL 1 MG/ML IJ SOLN
INTRAMUSCULAR | Status: AC
  Administered 2016-01-09: 0.5 mg via INTRAVENOUS
  Filled 2016-01-09: qty 1

## 2016-01-09 MED ORDER — ONDANSETRON HCL 4 MG/2ML IJ SOLN
4.0000 mg | Freq: Once | INTRAMUSCULAR | Status: AC | PRN
  Administered 2016-01-09: 4 mg via INTRAVENOUS

## 2016-01-09 MED ORDER — HYDROMORPHONE HCL 1 MG/ML IJ SOLN: 0.2000 mg | INTRAMUSCULAR | Status: DC | PRN

## 2016-01-09 MED ORDER — HYDROMORPHONE HCL 1 MG/ML IJ SOLN
0.2500 mg | INTRAMUSCULAR | Status: DC | PRN
  Administered 2016-01-09 (×2): 0.5 mg via INTRAVENOUS

## 2016-01-09 MED ORDER — HYDROMORPHONE HCL 1 MG/ML IJ SOLN
INTRAMUSCULAR | Status: AC
  Filled 2016-01-09: qty 1

## 2016-01-09 MED ORDER — LACTATED RINGERS IV SOLN
INTRAVENOUS | Status: DC
  Administered 2016-01-10: 01:00:00 via INTRAVENOUS

## 2016-01-09 MED ORDER — IBUPROFEN 400 MG PO TABS
400.0000 mg | ORAL_TABLET | Freq: Four times a day (QID) | ORAL | Status: DC | PRN
  Administered 2016-01-10: 400 mg via ORAL
  Filled 2016-01-09: qty 1

## 2016-01-09 MED ORDER — DOCUSATE SODIUM 100 MG PO CAPS
100.0000 mg | ORAL_CAPSULE | Freq: Two times a day (BID) | ORAL | Status: DC
  Administered 2016-01-09 – 2016-01-10 (×2): 100 mg via ORAL
  Filled 2016-01-09 (×2): qty 1

## 2016-01-09 MED ORDER — NITROFURANTOIN MONOHYD MACRO 100 MG PO CAPS: 100.0000 mg | ORAL_CAPSULE | Freq: Two times a day (BID) | ORAL | Status: DC

## 2016-01-09 MED ORDER — KETOROLAC TROMETHAMINE 30 MG/ML IJ SOLN
30.0000 mg | Freq: Once | INTRAMUSCULAR | Status: AC
  Administered 2016-01-09: 30 mg via INTRAVENOUS

## 2016-01-09 MED ORDER — SILVER NITRATE-POT NITRATE 75-25 % EX MISC
CUTANEOUS | Status: DC | PRN
  Administered 2016-01-09: 1

## 2016-01-09 MED ORDER — BUTALBITAL-APAP-CAFFEINE 50-325-40 MG PO TABS
1.0000 | ORAL_TABLET | ORAL | Status: DC
  Filled 2016-01-09: qty 1

## 2016-01-09 MED ORDER — PROPOFOL 10 MG/ML IV BOLUS
INTRAVENOUS | Status: DC | PRN
  Administered 2016-01-09: 200 ug/kg/min via INTRAVENOUS

## 2016-01-09 MED ORDER — ADULT MULTIVITAMIN W/MINERALS CH
1.0000 | ORAL_TABLET | Freq: Every day | ORAL | Status: DC
  Filled 2016-01-09 (×2): qty 1

## 2016-01-09 SURGICAL SUPPLY — 19 items
CANISTER SUCT 3000ML (MISCELLANEOUS) ×4 IMPLANT
CATH ROBINSON RED A/P 16FR (CATHETERS) ×4 IMPLANT
CORD URO TURP 10FT (MISCELLANEOUS) IMPLANT
ELECT LOOP MED HF 24F 12D (CUTTING LOOP) IMPLANT
ELECT REM PT RETURN 9FT ADLT (ELECTROSURGICAL) ×4
ELECT RESECT POWERBALL 24F (MISCELLANEOUS) IMPLANT
ELECTRODE REM PT RTRN 9FT ADLT (ELECTROSURGICAL) ×2 IMPLANT
GLOVE BIO SURGEON STRL SZ 6.5 (GLOVE) ×12 IMPLANT
GLOVE BIO SURGEONS STRL SZ 6.5 (GLOVE) ×4
GLOVE INDICATOR 7.0 STRL GRN (GLOVE) ×16 IMPLANT
GOWN STRL REUS W/ TWL LRG LVL3 (GOWN DISPOSABLE) ×4 IMPLANT
GOWN STRL REUS W/TWL LRG LVL3 (GOWN DISPOSABLE) ×4
IV LACTATED RINGERS 1000ML (IV SOLUTION) ×4 IMPLANT
KIT RM TURNOVER CYSTO AR (KITS) ×4 IMPLANT
PACK DNC HYST (MISCELLANEOUS) ×4 IMPLANT
PAD OB MATERNITY 4.3X12.25 (PERSONAL CARE ITEMS) ×4 IMPLANT
PAD PREP 24X41 OB/GYN DISP (PERSONAL CARE ITEMS) ×4 IMPLANT
TUBING CONNECTING 10 (TUBING) ×3 IMPLANT
TUBING CONNECTING 10' (TUBING) ×1

## 2016-01-09 NOTE — Transfer of Care (Signed)
Immediate Anesthesia Transfer of Care Note  Patient: [name]  Procedure(s) Performed: Procedure(s): DILATATION AND CURETTAGE /HYSTEROSCOPY/endometrial pap smear (N/A) CYSTOSCOPY  Patient Location: PACU  Anesthesia Type:General  Level of Consciousness: awake, alert  and oriented  Airway & Oxygen Therapy: Patient Spontanous Breathing and Patient connected to face mask oxygen  Post-op Assessment: Report given to RN and Post -op Vital signs reviewed and stable  Post vital signs: Reviewed and stable  Last Vitals:  Filed Vitals:   01/09/16 0615 01/09/16 0905  BP: 133/97 115/100  Pulse: 96 90  Temp: 36.8 C 36.1 C  Resp: 16 17    Complications: No apparent anesthesia complications

## 2016-01-09 NOTE — Interval H&P Note (Signed)
History and Physical Interval Note:  01/09/2016 7:31 AM  [name]  has presented today for surgery, with the diagnosis of AUB  The various methods of treatment have been discussed with the patient and family. After consideration of risks, benefits and other options for treatment, the patient has consented to  Procedure(s): DILATATION AND CURETTAGE /HYSTEROSCOPY/endometrial biopsy,pap smear (N/A) as a surgical intervention .  The patient's history has been reviewed, patient examined, no change in status, stable for surgery.  I have reviewed the patient's chart and labs.  Questions were answered to the patient's satisfaction.    She expects that her pelvic pain will be significant after the procedure; I have told her that I'll keep her overnight if needed to help control her pain. She does have an appointment with pelvic floor physical therapy after recovering from the procedure.  Benjaman Kindler

## 2016-01-09 NOTE — Anesthesia Procedure Notes (Signed)
Procedure Name: LMA Insertion Date/Time: 01/09/2016 7:43 AM Performed by: Jennette Bill Pre-anesthesia Checklist: Patient identified Patient Re-evaluated:Patient Re-evaluated prior to inductionOxygen Delivery Method: Circle system utilized Preoxygenation: Pre-oxygenation with 100% oxygen Intubation Type: IV induction LMA: LMA inserted LMA Size: 4.0 Placement Confirmation: positive ETCO2,  CO2 detector and breath sounds checked- equal and bilateral Tube secured with: Tape

## 2016-01-09 NOTE — Anesthesia Preprocedure Evaluation (Signed)
Anesthesia Evaluation  Patient identified by MRN, date of birth, ID band Patient awake    Reviewed: Allergy & Precautions, H&P , NPO status , Patient's Chart, lab work & pertinent test results, reviewed documented beta blocker date and time   History of Anesthesia Complications (+) DIFFICULT AIRWAY and history of anesthetic complications  Airway Mallampati: II  TM Distance: >3 FB Neck ROM: full    Dental  (+) Teeth Intact   Pulmonary neg pulmonary ROS, asthma ,    Pulmonary exam normal        Cardiovascular Exercise Tolerance: Good hypertension, negative cardio ROS Normal cardiovascular exam Rate:Normal  MVP   Neuro/Psych  Headaches, Anxiety Vertigo Hx negative neurological ROS  negative psych ROS   GI/Hepatic negative GI ROS, Neg liver ROS,   Endo/Other  negative endocrine ROS  Renal/GU Renal diseasenegative Renal ROS  negative genitourinary   Musculoskeletal   Abdominal   Peds  Hematology negative hematology ROS (+)   Anesthesia Other Findings   Reproductive/Obstetrics negative OB ROS                             Anesthesia Physical Anesthesia Plan  ASA: II  Anesthesia Plan: General LMA   Post-op Pain Management:    Induction:   Airway Management Planned:   Additional Equipment:   Intra-op Plan:   Post-operative Plan:   Informed Consent: I have reviewed the patients History and Physical, chart, labs and discussed the procedure including the risks, benefits and alternatives for the proposed anesthesia with the patient or authorized representative who has indicated his/her understanding and acceptance.   Dental Advisory Given  Plan Discussed with: CRNA  Anesthesia Plan Comments:         Anesthesia Quick Evaluation

## 2016-01-09 NOTE — Progress Notes (Signed)
Pt has received zofran for nausea which she came in with  Spitting up clear spit   Benadryl given for itch which is still present but does sleep at times  No raised areas no body

## 2016-01-09 NOTE — Discharge Instructions (Signed)
Dilation and Curettage or Vacuum Curettage, Care After Refer to this sheet in the next few weeks. These instructions provide you with information on caring for yourself after your procedure. Your health care provider may also give you more specific instructions. Your treatment has been planned according to current medical practices, but problems sometimes occur. Call your health care provider if you have any problems or questions after your procedure. WHAT TO EXPECT AFTER THE PROCEDURE After your procedure, it is typical to have light cramping and bleeding. This may last for 2 days to 2 weeks after the procedure. HOME CARE INSTRUCTIONS   Do not drive for 24 hours.  Wait 1 week before returning to strenuous activities.  Take your temperature 2 times a day for 4 days and write it down. Provide these temperatures to your health care provider if you develop a fever.  Avoid long periods of standing.  Avoid heavy lifting, pushing, or pulling. Do not lift anything heavier than 10 pounds (4.5 kg).  Limit stair climbing to once or twice a day.  Take rest periods often.  You may resume your usual diet.  Drink enough fluids to keep your urine clear or pale yellow.  Your usual bowel function should return. If you have constipation, you may:  Take a mild laxative with permission from your health care provider.  Add fruit and bran to your diet.  Drink more fluids.  Take showers instead of baths until your health care provider gives you permission to take baths.  Do not go swimming or use a hot tub until your health care provider approves.  Try to have someone with you or available to you the first 24-48 hours, especially if you were given a general anesthetic.  Do not douche, use tampons, or have sex (intercourse) for 2 weeks after the procedure.  Only take over-the-counter or prescription medicines as directed by your health care provider. Do not take aspirin. It can cause  bleeding.  Follow up with your health care provider as directed. SEEK MEDICAL CARE IF:   You have increasing cramps or pain that is not relieved with medicine.  You have abdominal pain that does not seem to be related to the same area of earlier cramping and pain.  You have bad smelling vaginal discharge.  You have a rash.  You are having problems with any medicine. SEEK IMMEDIATE MEDICAL CARE IF:   You have bleeding that is heavier than a normal menstrual period.  You have a fever.  You have chest pain.  You have shortness of breath.  You feel dizzy or feel like fainting.  You pass out.  You have pain in your shoulder strap area.  You have heavy vaginal bleeding with or without blood clots. MAKE SURE YOU:   Understand these instructions.  Will watch your condition.  Will get help right away if you are not doing well or get worse.   This information is not intended to replace advice given to you by your health care provider. Make sure you discuss any questions you have with your health care provider.   Document Released: 10/19/2000 Document Revised: 10/27/2013 Document Reviewed: 05/21/2013 Elsevier Interactive Patient Education 2016 [employer] Inc[employer]  

## 2016-01-09 NOTE — Progress Notes (Signed)
Day of Surgery Procedure(s) (LRB): DILATATION AND CURETTAGE /HYSTEROSCOPY, pap smear (N/A) CYSTOSCOPY  Subjective: Patient reports nausea and vomiting. And itching. Eating regular po. Some pain, controlled with po meds  Objective: I have reviewed patient's vital signs, intake and output and medications.  General: alert, cooperative, appears stated age and fatigued Resp: clear to auscultation bilaterally Cardio: regular rate and rhythm, S1, S2 normal, no murmur, click, rub or gallop GI: soft, non-tender; bowel sounds normal; no masses,  no organomegaly Extremities: extremities normal, atraumatic, no cyanosis or edema  Assessment: s/p Procedure(s): DILATATION AND CURETTAGE /HYSTEROSCOPY/endometrial pap smear (N/A) CYSTOSCOPY: stable, tolerating diet and benadryl for itching  Plan: Encourage ambulation, plan for am discharge     Benjaman Kindler 01/09/2016, 7:16 PM

## 2016-01-09 NOTE — Op Note (Signed)
Operative Report Hysteroscopy with Dilation and Curettage; cystoscopy   Indications: AUB, pelvic pain   Pre-operative Diagnosis: Pelvic pain limiting office exam; menorrhagia   Post-operative Diagnosis: same.  Procedure: 1. Exam under anesthesia 2. Fractional D&C 3. Hysteroscopy 4. Cystoscopy 5. Pap smear  Surgeon: Benjaman Kindler, MD  Assistant(s):  None  Anesthesia: General endotracheal anesthesia  Anesthesiologist: Molli Barrows, MD Anesthesiologist: Molli Barrows, MD CRNA: Jennette Bill  Estimated Blood Loss:  Minimal         Intraoperative medications: none         Total IV Fluids: 1000 ml  Urine Output: 51ml  Total Fluid Deficit:  Not measured         Specimens: Endocervical curettings, endometrial curettings, pap smear (the pap will be returned to the clinic for processing)         Complications:  None; patient tolerated the procedure well. Nausea on waking.         Disposition: PACU - hemodynamically stable.         Condition: stable  Findings: Uterus measuring 9 cm by sound with endometrial polyps; normal cervix, vagina, perineum. Bladder with apparent Hunner's ulcers, no masses or lesions, patent bilateral ureteral openings.  Indication for procedure/Consents: 40 y.o. [id number]  here for scheduled surgery for the aforementioned diagnoses.  Risks of surgery were discussed with the patient including but not limited to: bleeding which may require transfusion; infection which may require antibiotics; injury to uterus or surrounding organs; intrauterine scarring which may impair future fertility; need for additional procedures including laparotomy or laparoscopy; and other postoperative/anesthesia complications. Written informed consent was obtained.    Procedure Details:  D&C/Polypectomy The patient was taken to the operating room where anesthesia was administered and was found to be adequate. After a formal and adequate timeout was performed, she was placed in  the dorsal lithotomy position and examined with the above findings. A pap smear was collected. She was then prepped and draped in the sterile manner. Her bladder was catheterized for an estimated amount of clear, yellow urine. A weighed speculum was then placed in the patient's vagina and a single tooth tenaculum was applied to the anterior lip of the cervix.  Her cervix was serially dilated to 15 [place] using Hanks dilators. An ECC was performed. Her uterus was sounded to 9 cm. The hysteroscope was introduced to reveal the above findings. Endometrial polypectomy was performed with polypectomy forceps.   A sharp curettage was then performed until there was a gritty texture in all four quadrants. The tenaculum was removed from the anterior lip of the cervix and the vaginal speculum was removed after applying silver nitrate for good hemostasis.   The Foley catheter was temporarily removed and an uncomplicated cystoscopy was performed. 800 mL of fluid was instilled for hydro-distention, drained, and the bladder mucosa was visualized again with the above findings. Excellent efflux was noted from both ureteral orifices.  The patient tolerated the procedure well and was taken to the recovery area awake and in stable condition. She received iv acetaminophen and Toradol prior to leaving the OR.  The patient will be admitted for observation, as she has experienced significant nausea and pain after procedures in the past. I will reassess her pain control this afternoon and discharge if clinically appropriate.  Routine postoperative instructions given. She was prescribed Ibuprofen and Colace. She will follow up in the clinic in two weeks for postoperative evaluation.

## 2016-01-10 DIAGNOSIS — N840 Polyp of corpus uteri: Secondary | ICD-10-CM | POA: Diagnosis not present

## 2016-01-10 LAB — URINALYSIS COMPLETE WITH MICROSCOPIC (ARMC ONLY)
BILIRUBIN URINE: NEGATIVE
Bacteria, UA: NONE SEEN
Glucose, UA: NEGATIVE mg/dL
KETONES UR: NEGATIVE mg/dL
NITRITE: NEGATIVE
PH: 7 (ref 5.0–8.0)
Protein, ur: NEGATIVE mg/dL
Specific Gravity, Urine: 1.008 (ref 1.005–1.030)

## 2016-01-10 LAB — SURGICAL PATHOLOGY

## 2016-01-10 NOTE — Discharge Summary (Signed)
Physician Discharge Summary  Patient ID: [name] MRN: [id number] DOB/AGE: Jan 30, 1976 40 y.o.  Admit date: 01/09/2016 Discharge date: 01/10/2016  Admission Diagnoses:  Discharge Diagnoses:  Active Problems:   Pelvic pain in female   Discharged Condition: good  Hospital Course: Admitted for obs and paina control after D&C, hysteroscopy. POD#1 tolerating po, voiding spontaneously, pain controlled with po meds.  Consults: None  Discharge Exam: Blood pressure 117/63, pulse 74, temperature 98.7 F (37.1 C), temperature source Oral, resp. rate 18, last menstrual period 12/19/2015, SpO2 99 %. General appearance: alert, cooperative and appears stated age Resp: clear to auscultation bilaterally Cardio: regular rate and rhythm, S1, S2 normal, no murmur, click, rub or gallop GI: soft, non-tender; bowel sounds normal; no masses,  no organomegaly Extremities: extremities normal, atraumatic, no cyanosis or edema Pulses: 2+ and symmetric Skin: Skin color, texture, turgor normal. No rashes or lesions  Disposition: 01-Home or Self Care     Medication List    ASK your doctor about these medications        amitriptyline 25 MG tablet  Commonly known as:  ELAVIL  Take 1 tablet by mouth daily.     aspirin 81 MG chewable tablet  Chew 81 mg by mouth daily. Reported on 01/09/2016     butalbital-acetaminophen-caffeine 50-325-40 MG tablet  Commonly known as:  FIORICET, ESGIC  Take 1 tablet by mouth every 4 (four) hours.     chlorhexidine 0.12 % solution  Commonly known as:  PERIDEX  15 mLs by Mouth Rinse route 2 (two) times daily.     cyclobenzaprine 10 MG tablet  Commonly known as:  FLEXERIL  Take 1 tablet (10 mg total) by mouth 3 (three) times daily as needed for muscle spasms.     Fish Oil 1000 MG Caps  Take 1 capsule by mouth daily.     ibuprofen 200 MG tablet  Commonly known as:  ADVIL,MOTRIN  Take 2 tablets (400 mg total) by mouth every 6 (six) hours as needed for headache or  moderate pain.     Iron 28 MG Tabs  Take 1 tablet by mouth daily as needed.     meclizine 25 MG tablet  Commonly known as:  ANTIVERT  Take 25 mg by mouth 4 (four) times daily as needed for dizziness.     meloxicam 15 MG tablet  Commonly known as:  MOBIC  Take 7.5 mg by mouth daily.     multivitamin with minerals Tabs tablet  Take 1 tablet by mouth daily. Reported on 01/03/2016     nitrofurantoin (macrocrystal-monohydrate) 100 MG capsule  Commonly known as:  MACROBID  Take 1 capsule (100 mg total) by mouth every 12 (twelve) hours.     polyethylene glycol packet  Commonly known as:  MIRALAX / GLYCOLAX  Take 17 g by mouth daily.     traMADol 50 MG tablet  Commonly known as:  ULTRAM  Take 1 tablet (50 mg total) by mouth every 6 (six) hours as needed.           Follow-up Information    Follow up with Benjaman Kindler, MD In 2 weeks.   Specialty:  Obstetrics and Gynecology   Why:  For postop check   Contact information:   [place] [address] [phone]       Signed: Benjaman Kindler 01/10/2016, 8:37 AM

## 2016-01-10 NOTE — Progress Notes (Signed)
Pt discharged to home via wheelchair.  Follow up appointment made. Discharge papers given and reviewed with pt.

## 2016-01-10 NOTE — Anesthesia Postprocedure Evaluation (Signed)
Anesthesia Post Note  Patient: Zaylin J Black  Procedure(s) Performed: Procedure(s) (LRB): DILATATION AND CURETTAGE /HYSTEROSCOPY/endometrial pap smear (N/A) CYSTOSCOPY  Patient location during evaluation: PACU Anesthesia Type: General Level of consciousness: awake and alert Pain management: pain level controlled Vital Signs Assessment: post-procedure vital signs reviewed and stable Respiratory status: spontaneous breathing, nonlabored ventilation, respiratory function stable and patient connected to nasal cannula oxygen Cardiovascular status: blood pressure returned to baseline and stable Postop Assessment: no signs of nausea or vomiting Anesthetic complications: no    Last Vitals:  Filed Vitals:   01/10/16 0759 01/10/16 1127  BP: 117/63 104/60  Pulse: 74 72  Temp: 37.1 C 36.7 C  Resp: 18 20    Last Pain:  Filed Vitals:   01/10/16 1529  PainSc: 7                  Molli Barrows

## 2016-01-10 NOTE — Progress Notes (Signed)
Updated Dr.Beasley on UA results for [name].  No New orders. [place] for Discharge

## 2016-01-13 LAB — URINE CULTURE: Culture: 70000

## 2016-02-06 ENCOUNTER — Emergency Department (HOSPITAL_COMMUNITY)

## 2016-02-06 ENCOUNTER — Emergency Department (HOSPITAL_COMMUNITY)
Admission: EM | Admit: 2016-02-06 | Discharge: 2016-02-06 | Disposition: A | Discharge status: Home / Self Care | Attending: Emergency Medicine | Admitting: Emergency Medicine

## 2016-02-06 ENCOUNTER — Encounter (HOSPITAL_COMMUNITY): Payer: Self-pay | Admitting: Emergency Medicine

## 2016-02-06 DIAGNOSIS — Z87440 Personal history of urinary (tract) infections: Secondary | ICD-10-CM | POA: Insufficient documentation

## 2016-02-06 DIAGNOSIS — Z7982 Long term (current) use of aspirin: Secondary | ICD-10-CM | POA: Insufficient documentation

## 2016-02-06 DIAGNOSIS — F410 Panic disorder [episodic paroxysmal anxiety] without agoraphobia: Secondary | ICD-10-CM | POA: Diagnosis not present

## 2016-02-06 DIAGNOSIS — Z791 Long term (current) use of non-steroidal anti-inflammatories (NSAID): Secondary | ICD-10-CM | POA: Insufficient documentation

## 2016-02-06 DIAGNOSIS — J45909 Unspecified asthma, uncomplicated: Secondary | ICD-10-CM | POA: Insufficient documentation

## 2016-02-06 DIAGNOSIS — R05 Cough: Secondary | ICD-10-CM | POA: Diagnosis present

## 2016-02-06 DIAGNOSIS — G43909 Migraine, unspecified, not intractable, without status migrainosus: Secondary | ICD-10-CM | POA: Insufficient documentation

## 2016-02-06 DIAGNOSIS — I10 Essential (primary) hypertension: Secondary | ICD-10-CM | POA: Insufficient documentation

## 2016-02-06 DIAGNOSIS — B349 Viral infection, unspecified: Secondary | ICD-10-CM | POA: Insufficient documentation

## 2016-02-06 DIAGNOSIS — Z8742 Personal history of other diseases of the female genital tract: Secondary | ICD-10-CM | POA: Insufficient documentation

## 2016-02-06 DIAGNOSIS — Z87448 Personal history of other diseases of urinary system: Secondary | ICD-10-CM | POA: Diagnosis not present

## 2016-02-06 DIAGNOSIS — Z79899 Other long term (current) drug therapy: Secondary | ICD-10-CM | POA: Diagnosis not present

## 2016-02-06 DIAGNOSIS — M419 Scoliosis, unspecified: Secondary | ICD-10-CM | POA: Diagnosis not present

## 2016-02-06 LAB — BASIC METABOLIC PANEL
Anion gap: 9 (ref 5–15)
BUN: 13 mg/dL (ref 6–20)
CHLORIDE: 102 mmol/L (ref 101–111)
CO2: 28 mmol/L (ref 22–32)
CREATININE: 0.85 mg/dL (ref 0.44–1.00)
Calcium: 9 mg/dL (ref 8.9–10.3)
GFR calc Af Amer: >60 mL/min (ref >60)
GFR calc non Af Amer: >60 mL/min (ref >60)
Glucose, Bld: 89 mg/dL (ref 65–99)
POTASSIUM: 4 mmol/L (ref 3.5–5.1)
Sodium: 139 mmol/L (ref 135–145)

## 2016-02-06 LAB — CBC
HEMATOCRIT: 35.3 % — AB (ref 36.0–46.0)
Hemoglobin: 11.5 g/dL — ABNORMAL LOW (ref 12.0–15.0)
MCH: 26.6 pg (ref 26.0–34.0)
MCHC: 32.6 g/dL (ref 30.0–36.0)
MCV: 81.5 fL (ref 78.0–100.0)
PLATELETS: 409 10*3/uL — AB (ref 150–400)
RBC: 4.33 MIL/uL (ref 3.87–5.11)
RDW: 15.2 % (ref 11.5–15.5)
WBC: 6.8 10*3/uL (ref 4.0–10.5)

## 2016-02-06 LAB — RAPID STREP SCREEN (MED CTR MEBANE ONLY): Streptococcus, Group A Screen (Direct): NEGATIVE

## 2016-02-06 LAB — CBG MONITORING, ED: GLUCOSE-CAPILLARY: 78 mg/dL (ref 65–99)

## 2016-02-06 MED ORDER — BENZONATATE 100 MG PO CAPS: 100.0000 mg | ORAL_CAPSULE | Freq: Three times a day (TID) | ORAL | Status: DC | PRN

## 2016-02-06 NOTE — ED Notes (Addendum)
Pt states productive cough and sore throat on-going x 1 week. Went to urgent care 2 days ago and was prescribed a Z-pack and given hycodan syrup. Pt states she's now feeling weak, and her cough is becoming more productive. Patient hoarse in triage.

## 2016-02-06 NOTE — ED Provider Notes (Signed)
CSN: [id number]     Arrival date & time 02/06/16  1810 History   First MD Initiated Contact with Patient 02/06/16 2008     Chief Complaint  Patient presents with  . Weakness  . Cough  . Sore Throat   (Consider location/radiation/quality/duration/timing/severity/associated sxs/prior Treatment) HPI 40 y.o. female  presents to the Emergency Department today complaining of cough, sore throat x 1 week. States that she was seen in Urgent Care x2 days ago and given Rx ZPack. Notes worsening weakness and cough today. Pain is 6/10 in throat and dull. No fevers. No CP/SOB/ABD pain. No N/V/D. No headaches. Notes rhinorrhea. No other symptoms noted.   Past Medical History  Diagnosis Date  . Panic attacks   . Scoliosis   . Mitral valve prolapse   . Asthma     As a child  . Renal disorder   . UTI (lower urinary tract infection)   . Hypertension   . Ovarian cyst   . Migraine   . Vertigo   . Degenerative disk disease   . Palpitations    Past Surgical History  Procedure Laterality Date  . Tubal ligation    . Laparoscopy    . Cesarean section    . Cholecystectomy    . Knee surgery    . Back surgery    . Radio frequency albiation     . Hysteroscopy w/d&c N/A 01/09/2016    Procedure: DILATATION AND CURETTAGE /HYSTEROSCOPY/endometrial pap smear;  Surgeon: Benjaman Kindler, MD;  Location: [facility] ORS;  Service: Gynecology;  Laterality: N/A;  . Cystoscopy  01/09/2016    Procedure: CYSTOSCOPY;  Surgeon: Benjaman Kindler, MD;  Location: [facility] ORS;  Service: Gynecology;;   Family History  Problem Relation Age of Onset  . Hypertension Mother   . Diabetes Mother   . Stroke Mother   . Heart disease Mother   . Hypertension Father   . Diabetes Father   . Dementia Father   . Schizophrenia Father    Social History  Substance Use Topics  . Smoking status: Never Smoker   . Smokeless tobacco: Never Used  . Alcohol Use: No   OB History    Gravida Para Term Preterm AB TAB SAB Ectopic Multiple Living   3  3 2 1      3      Review of Systems ROS reviewed and all are negative for acute change except as noted in the HPI.  Allergies  Sulfonamide derivatives; Ciprofloxacin; Dilaudid; Morphine and related; Norvasc; and Amlodipine besylate  Home Medications   Prior to Admission medications   Medication Sig Start Date End Date Taking? Authorizing Provider  azithromycin (ZITHROMAX) 250 MG tablet Take 250-500 mg by mouth daily. Take 500mg s on day1 then 250mg s daily the next 4 days   Yes Historical Provider, MD  butalbital-acetaminophen-caffeine (FIORICET, ESGIC) 50-325-40 MG tablet Take 1 tablet by mouth every 4 (four) hours. 09/20/15  Yes Nita Sells, MD  chlorpheniramine-HYDROcodone (TUSSIONEX) 10-8 MG/5ML SUER Take 5 mLs by mouth at bedtime as needed. 02/04/16 02/14/16 Yes Historical Provider, MD  cyclobenzaprine (FLEXERIL) 10 MG tablet Take 1 tablet (10 mg total) by mouth 3 (three) times daily as needed for muscle spasms. 09/20/15  Yes Nita Sells, MD  ibuprofen (ADVIL,MOTRIN) 200 MG tablet Take 2 tablets (400 mg total) by mouth every 6 (six) hours as needed for headache or moderate pain. 09/05/15  Yes Harvel Quale, MD  amitriptyline (ELAVIL) 25 MG tablet Take 1 tablet by mouth daily. 03/19/14 03/19/15  Historical Provider, MD  aspirin 81 MG chewable tablet Chew 81 mg by mouth daily. Reported on 01/09/2016    Historical Provider, MD  chlorhexidine (PERIDEX) 0.12 % solution 15 mLs by Mouth Rinse route 2 (two) times daily. Patient not taking: Reported on 01/03/2016 09/20/15   Nita Sells, MD  Ferrous Sulfate (IRON) 28 MG TABS Take 1 tablet by mouth daily as needed.    Historical Provider, MD  meclizine (ANTIVERT) 25 MG tablet Take 25 mg by mouth 4 (four) times daily as needed for dizziness.    Historical Provider, MD  meloxicam (MOBIC) 15 MG tablet Take 7.5 mg by mouth daily.    Historical Provider, MD  Multiple Vitamin (MULTIVITAMIN WITH MINERALS) TABS tablet Take 1 tablet by  mouth daily. Reported on 01/03/2016    Historical Provider, MD  nitrofurantoin, macrocrystal-monohydrate, (MACROBID) 100 MG capsule Take 1 capsule (100 mg total) by mouth every 12 (twelve) hours. Patient not taking: Reported on 01/09/2016 09/20/15   Nita Sells, MD  Omega-3 Fatty Acids (FISH OIL) 1000 MG CAPS Take 1 capsule by mouth daily.    Historical Provider, MD  polyethylene glycol (MIRALAX / GLYCOLAX) packet Take 17 g by mouth daily. 09/20/15   Nita Sells, MD  traMADol (ULTRAM) 50 MG tablet Take 1 tablet (50 mg total) by mouth every 6 (six) hours as needed. 09/20/15   Nita Sells, MD   BP 138/97 mmHg  Pulse 90  Temp(Src) 98.5 F (36.9 C) (Oral)  Resp 18  SpO2 99%   Physical Exam  Constitutional: She is oriented to person, place, and time. She appears well-developed and well-nourished. No distress.  HENT:  Head: Normocephalic and atraumatic.  Right Ear: Tympanic membrane, external ear and ear canal normal.  Left Ear: Tympanic membrane, external ear and ear canal normal.  Nose: Nose normal.  Mouth/Throat: Uvula is midline and mucous membranes are normal. No trismus in the jaw. Posterior oropharyngeal erythema present. No oropharyngeal exudate or tonsillar abscesses.  Eyes: EOM are normal. Pupils are equal, round, and reactive to light.  Neck: Normal range of motion. Neck supple. No tracheal deviation present.  Cardiovascular: Normal rate, regular rhythm, S1 normal, S2 normal, normal heart sounds, intact distal pulses and normal pulses.   Pulmonary/Chest: Effort normal and breath sounds normal. No respiratory distress. She has no decreased breath sounds. She has no wheezes. She has no rhonchi. She has no rales.  Abdominal: Soft. Normal appearance and bowel sounds are normal. There is no tenderness.  Musculoskeletal: Normal range of motion.  Neurological: She is alert and oriented to person, place, and time.  Skin: Skin is warm and dry.  Psychiatric: She has a  normal mood and affect. Her speech is normal and behavior is normal. Thought content normal.  Nursing note and vitals reviewed.  ED Course  Procedures (including critical care time) Labs Review Labs Reviewed  CBC - Abnormal; Notable for the following:    Hemoglobin 11.5 (*)    HCT 35.3 (*)    Platelets 409 (*)    All other components within normal limits  RAPID STREP SCREEN (NOT AT [facility])  CULTURE, GROUP A STREP [facility])  BASIC METABOLIC PANEL  URINALYSIS, ROUTINE W REFLEX MICROSCOPIC (NOT AT [facility])  CBG MONITORING, ED   Imaging Review Dg Chest 2 View  02/06/2016  CLINICAL DATA:  Cough and fever for 1 week EXAM: CHEST  2 VIEW COMPARISON:  September 15, 2015 FINDINGS: Lungs are clear. Heart size and pulmonary vascularity are normal. No adenopathy. There is  mid thoracic dextroscoliosis. IMPRESSION: No edema or consolidation. Electronically Signed   By: Lowella Grip III M.D.   On: 02/06/2016 20:54   I have personally reviewed and evaluated these images and lab results as part of my medical decision-making.   EKG Interpretation None      MDM  I have reviewed and evaluated the relevant laboratory values. I have reviewed and evaluated the relevant imaging studies. I have reviewed the relevant previous healthcare records. I obtained HPI from historian.  ED Course:  Assessment: Pt is a 39yF presents with URI symptoms x1 weeks . On exam, pt in NAD. VSS. Afebrile. Lungs CTA, Heart RRR. Abdomen nontender/soft. Oropharynx shows erythema. No trismus. No abscess. Pt phonates well. Pt CXR negative for acute infiltrate. Patients symptoms are consistent with URI, likely viral etiology. Discussed that antibiotics are not indicated for viral infections. Pt will be discharged with symptomatic treatment.  Verbalizes understanding and is agreeable with plan. Pt is hemodynamically stable & in NAD prior to dc.  Disposition/Plan:  DC Home Additional Verbal discharge instructions given and discussed with  patient.  Pt Instructed to f/u with PCP in the next 48 hours for evaluation and treatment of symptoms. Return precautions given Pt acknowledges and agrees with plan  Supervising Physician Forde Dandy, MD   Final diagnoses:  Viral syndrome     Shary Decamp, PA-C 02/06/16 2106  Forde Dandy, MD 02/07/16 [phone]

## 2016-02-06 NOTE — Discharge Instructions (Signed)
Please read and follow all provided instructions.  Your diagnoses today include:  1. Viral syndrome    Tests performed today include:  Vital signs. See below for your results today.  Chest Xray  Rapid Strep Test  Medications prescribed:   None   Home care instructions:  Follow any educational materials contained in this packet.  Follow-up instructions: Please follow-up with your primary care provider in the next 48 hours for further evaluation of symptoms and treatment   Return instructions:   Please return to the Emergency Department if you do not get better, if you get worse, or new symptoms OR  - Fever (temperature greater than 101.36F)  - Bleeding that does not stop with holding pressure to the area    -Severe pain (please note that you may be more sore the day after your accident)  - Chest Pain  - Difficulty breathing  - Severe nausea or vomiting  - Inability to tolerate food and liquids  - Passing out  - Skin becoming red around your wounds  - Change in mental status (confusion or lethargy)  - New numbness or weakness     Please return if you have any other emergent concerns.  Additional Information:  Your vital signs today were: BP 138/97 mmHg   Pulse 90   Temp(Src) 98.5 F (36.9 C) (Oral)   Resp 18   SpO2 99%   LMP 01/16/2016 If your blood pressure (BP) was elevated above 135/85 this visit, please have this repeated by your doctor within one month. ---------------

## 2016-02-09 LAB — CULTURE, GROUP A STREP (THRC)

## 2016-02-14 ENCOUNTER — Encounter: Payer: Self-pay | Admitting: Urology

## 2016-02-14 ENCOUNTER — Ambulatory Visit: Payer: Self-pay | Admitting: Urology

## 2016-02-16 ENCOUNTER — Ambulatory Visit: Attending: Obstetrics and Gynecology | Admitting: Physical Therapy

## 2016-02-23 ENCOUNTER — Ambulatory Visit: Admitting: Physical Therapy

## 2016-02-24 ENCOUNTER — Emergency Department (HOSPITAL_COMMUNITY)

## 2016-02-24 ENCOUNTER — Emergency Department (HOSPITAL_COMMUNITY)
Admission: EM | Admit: 2016-02-24 | Discharge: 2016-02-24 | Disposition: A | Discharge status: Home / Self Care | Attending: Emergency Medicine | Admitting: Emergency Medicine

## 2016-02-24 ENCOUNTER — Other Ambulatory Visit: Payer: Self-pay | Admitting: Gastroenterology

## 2016-02-24 ENCOUNTER — Encounter (HOSPITAL_COMMUNITY): Payer: Self-pay

## 2016-02-24 DIAGNOSIS — R0789 Other chest pain: Secondary | ICD-10-CM | POA: Insufficient documentation

## 2016-02-24 DIAGNOSIS — Z79899 Other long term (current) drug therapy: Secondary | ICD-10-CM | POA: Insufficient documentation

## 2016-02-24 DIAGNOSIS — N289 Disorder of kidney and ureter, unspecified: Secondary | ICD-10-CM | POA: Diagnosis not present

## 2016-02-24 DIAGNOSIS — Z792 Long term (current) use of antibiotics: Secondary | ICD-10-CM | POA: Insufficient documentation

## 2016-02-24 DIAGNOSIS — Z791 Long term (current) use of non-steroidal anti-inflammatories (NSAID): Secondary | ICD-10-CM | POA: Diagnosis not present

## 2016-02-24 DIAGNOSIS — R6881 Early satiety: Secondary | ICD-10-CM

## 2016-02-24 DIAGNOSIS — Z7982 Long term (current) use of aspirin: Secondary | ICD-10-CM | POA: Diagnosis not present

## 2016-02-24 DIAGNOSIS — Z79891 Long term (current) use of opiate analgesic: Secondary | ICD-10-CM | POA: Insufficient documentation

## 2016-02-24 DIAGNOSIS — I10 Essential (primary) hypertension: Secondary | ICD-10-CM | POA: Insufficient documentation

## 2016-02-24 DIAGNOSIS — R110 Nausea: Secondary | ICD-10-CM

## 2016-02-24 DIAGNOSIS — J45909 Unspecified asthma, uncomplicated: Secondary | ICD-10-CM | POA: Insufficient documentation

## 2016-02-24 DIAGNOSIS — R079 Chest pain, unspecified: Secondary | ICD-10-CM | POA: Diagnosis present

## 2016-02-24 LAB — CBC
HEMATOCRIT: 34 % — AB (ref 36.0–46.0)
HEMOGLOBIN: 11.1 g/dL — AB (ref 12.0–15.0)
MCH: 26.4 pg (ref 26.0–34.0)
MCHC: 32.6 g/dL (ref 30.0–36.0)
MCV: 80.8 fL (ref 78.0–100.0)
PLATELETS: 445 10*3/uL — AB (ref 150–400)
RBC: 4.21 MIL/uL (ref 3.87–5.11)
RDW: 15.7 % — AB (ref 11.5–15.5)
WBC: 4 10*3/uL (ref 4.0–10.5)

## 2016-02-24 LAB — BASIC METABOLIC PANEL
ANION GAP: 7 (ref 5–15)
BUN: 11 mg/dL (ref 6–20)
CHLORIDE: 107 mmol/L (ref 101–111)
CO2: 26 mmol/L (ref 22–32)
CREATININE: 0.61 mg/dL (ref 0.44–1.00)
Calcium: 9.1 mg/dL (ref 8.9–10.3)
GFR calc non Af Amer: >60 mL/min (ref >60)
Glucose, Bld: 107 mg/dL — ABNORMAL HIGH (ref 65–99)
POTASSIUM: 3.5 mmol/L (ref 3.5–5.1)
Sodium: 140 mmol/L (ref 135–145)

## 2016-02-24 LAB — I-STAT TROPONIN, ED: TROPONIN I, POC: 0 ng/mL (ref 0.00–0.08)

## 2016-02-24 NOTE — ED Notes (Signed)
Pt c/o intermittent L chest pain radiating down L arm x "over 2 weeks" and hypertension yesterday.  Pain score 7/10.  Pt reports having an endoscopy yesterday and BP was high prior and post procedure.  Pt vague regarding any associated symptoms.

## 2016-02-24 NOTE — ED Provider Notes (Signed)
CSN: [id number]     Arrival date & time 02/24/16  1409 History   First MD Initiated Contact with Patient 02/24/16 1633     Chief Complaint  Patient presents with  . Chest Pain  . Hypertension   HPI  [name] is an 40 y.o. female with history of HTN who presents to the ED for evaluation of elevated BP and chest pain. She states she has had a history of intermittently elevated BP readings in the past but she was never started on any kind of antihypertensive. She states now for the past two weeks she has had intermittent dull left sided chest pain with some radiating down her left arm. She states that yesterday she had an endoscopic procedure done and was told her BP was high. She states she called PCP today who told her to come to the ED for evaluation. She denies SOB or diaphoresis. Denies urinary issues, headache, visual disturbance. Denies new swelling. Denies n/v/d.   Past Medical History  Diagnosis Date  . Panic attacks   . Scoliosis   . Mitral valve prolapse   . Asthma     As a child  . Renal disorder   . UTI (lower urinary tract infection)   . Hypertension   . Ovarian cyst   . Migraine   . Vertigo   . Degenerative disk disease   . Palpitations    Past Surgical History  Procedure Laterality Date  . Tubal ligation    . Laparoscopy    . Cesarean section    . Cholecystectomy    . Knee surgery    . Back surgery    . Radio frequency albiation     . Hysteroscopy w/d&c N/A 01/09/2016    Procedure: DILATATION AND CURETTAGE /HYSTEROSCOPY/endometrial pap smear;  Surgeon: Benjaman Kindler, MD;  Location: [facility] ORS;  Service: Gynecology;  Laterality: N/A;  . Cystoscopy  01/09/2016    Procedure: CYSTOSCOPY;  Surgeon: Benjaman Kindler, MD;  Location: [facility] ORS;  Service: Gynecology;;  . Esophagogastroduodenoscopy endoscopy     Family History  Problem Relation Age of Onset  . Hypertension Mother   . Diabetes Mother   . Stroke Mother   . Heart disease Mother   . Hypertension Father    . Diabetes Father   . Dementia Father   . Schizophrenia Father    Social History  Substance Use Topics  . Smoking status: Never Smoker   . Smokeless tobacco: Never Used  . Alcohol Use: No   OB History    Gravida Para Term Preterm AB TAB SAB Ectopic Multiple Living   3 3 2 1      3      Review of Systems  All other systems reviewed and are negative.     Allergies  Sulfonamide derivatives; Ciprofloxacin; Dilaudid; Morphine and related; Norvasc; and Amlodipine besylate  Home Medications   Prior to Admission medications   Medication Sig Start Date End Date Taking? Authorizing Provider  amitriptyline (ELAVIL) 25 MG tablet Take 1 tablet by mouth daily. 03/19/14 03/19/15  Historical Provider, MD  Ascorbic Acid (VITAMIN C) 1000 MG tablet Take 1,000 mg by mouth daily.    Historical Provider, MD  aspirin 81 MG chewable tablet Chew 81 mg by mouth daily. Reported on 01/09/2016    Historical Provider, MD  azithromycin (ZITHROMAX) 250 MG tablet Take 250-500 mg by mouth daily. Take 500mg s on day1 then 250mg s daily the next 4 days    Historical Provider, MD  benzonatate (  TESSALON PERLES) 100 MG capsule Take 1 capsule (100 mg total) by mouth 3 (three) times daily as needed for cough. 02/06/16   Shary Decamp, PA-C  BIOTIN PO Take 1 tablet by mouth daily.    Historical Provider, MD  butalbital-acetaminophen-caffeine (FIORICET, ESGIC) 50-325-40 MG tablet Take 1 tablet by mouth every 4 (four) hours. 09/20/15   Nita Sells, MD  chlorhexidine (PERIDEX) 0.12 % solution 15 mLs by Mouth Rinse route 2 (two) times daily. Patient not taking: Reported on 01/03/2016 09/20/15   Nita Sells, MD  clotrimazole-betamethasone (LOTRISONE) cream Apply 1 application topically 2 (two) times daily. To left foot and between toes 11/24/15   Historical Provider, MD  CRANBERRY PO Take 1 tablet by mouth daily.    Historical Provider, MD  cyclobenzaprine (FLEXERIL) 10 MG tablet Take 1 tablet (10 mg total) by mouth 3  (three) times daily as needed for muscle spasms. 09/20/15   Nita Sells, MD  ECHINACEA PO Take 1 tablet by mouth daily.    Historical Provider, MD  ergocalciferol (VITAMIN D2) 50000 units capsule Take 50,000 Units by mouth once a week.    Historical Provider, MD  Ferrous Sulfate (IRON) 28 MG TABS Take 1 tablet by mouth daily as needed (monthly bleeding).     Historical Provider, MD  ibuprofen (ADVIL,MOTRIN) 200 MG tablet Take 2 tablets (400 mg total) by mouth every 6 (six) hours as needed for headache or moderate pain. 09/05/15   Harvel Quale, MD  meclizine (ANTIVERT) 25 MG tablet Take 25 mg by mouth 4 (four) times daily as needed for dizziness.    Historical Provider, MD  meloxicam (MOBIC) 15 MG tablet Take 7.5 mg by mouth daily as needed for pain.     Historical Provider, MD  nitrofurantoin, macrocrystal-monohydrate, (MACROBID) 100 MG capsule Take 1 capsule (100 mg total) by mouth every 12 (twelve) hours. Patient not taking: Reported on 01/09/2016 09/20/15   Nita Sells, MD  nitroGLYCERIN (NITROSTAT) 0.4 MG SL tablet Place 0.4 mg under the tongue every 5 (five) minutes as needed for chest pain.     Historical Provider, MD  Omega-3 Fatty Acids (FISH OIL) 1000 MG CAPS Take 1 capsule by mouth daily.    Historical Provider, MD  polyethylene glycol (MIRALAX / GLYCOLAX) packet Take 17 g by mouth daily. Patient taking differently: Take 17 g by mouth daily as needed for moderate constipation.  09/20/15   Nita Sells, MD  Probiotic Product (PROBIOTIC PO) Take 1 capsule by mouth daily.    Historical Provider, MD  traMADol (ULTRAM) 50 MG tablet Take 1 tablet (50 mg total) by mouth every 6 (six) hours as needed. 09/20/15   Nita Sells, MD   BP 149/96 mmHg  Pulse 75  Temp(Src) 98.2 F (36.8 C) (Oral)  Resp 18  SpO2 100%  LMP 02/16/2016 Physical Exam  Constitutional: She is oriented to person, place, and time.  HENT:  Right Ear: External ear normal.  Left Ear:  External ear normal.  Nose: Nose normal.  Mouth/Throat: Oropharynx is clear and moist. No oropharyngeal exudate.  Eyes: Conjunctivae and EOM are normal. Pupils are equal, round, and reactive to light.  Neck: Normal range of motion. Neck supple.  Cardiovascular: Normal rate, regular rhythm, normal heart sounds and intact distal pulses.   Pulmonary/Chest: Effort normal and breath sounds normal. No respiratory distress. She has no wheezes. She exhibits no tenderness.  Abdominal: Soft. Bowel sounds are normal. She exhibits no distension. There is no tenderness. There is no rebound and  no guarding.  Musculoskeletal: She exhibits no edema.  Neurological: She is alert and oriented to person, place, and time. No cranial nerve deficit.  Skin: Skin is warm and dry.  Psychiatric: She has a normal mood and affect.  Nursing note and vitals reviewed.   ED Course  Procedures (including critical care time) Labs Review Labs Reviewed  BASIC METABOLIC PANEL - Abnormal; Notable for the following:    Glucose, Bld 107 (*)    All other components within normal limits  CBC - Abnormal; Notable for the following:    Hemoglobin 11.1 (*)    HCT 34.0 (*)    RDW 15.7 (*)    Platelets 445 (*)    All other components within normal limits  I-STAT TROPOININ, ED    Imaging Review Dg Chest 2 View  02/24/2016  CLINICAL DATA:  Left-sided chest pain for 2 weeks EXAM: CHEST  2 VIEW COMPARISON:  02/06/2016 FINDINGS: Normal heart size. Lungs clear. No pneumothorax. No pleural effusion. Stable scoliosis. IMPRESSION: No active cardiopulmonary disease. Electronically Signed   By: Marybelle Killings M.D.   On: 02/24/2016 14:58   I have personally reviewed and evaluated these images and lab results as part of my medical decision-making.   EKG Interpretation None      MDM   Final diagnoses:  Atypical chest pain  Essential hypertension    Labs unremarkalbe. Kidney function good. Trop negative. CXR negative. EKG nonacute.  HEART score 1. PERC negative. Doubt ACS, PE, or other emergent cardiopulmonary etiology. No e/o end organ damage. BP unremarkable in the ED. Discussed with pt conservative measures such as lifestyle modification. Instructed to f/u with PCP for BP monitoring and in the future can decide if medication for hypertension is warranted. ER return precautions given.    Anne Ng, PA-C 02/24/16 2320  Lacretia Leigh, MD 02/27/16 1332

## 2016-02-24 NOTE — Discharge Instructions (Signed)
You were seen in the emergency room today for evaluation of chest pain and high blood pressure. Your labs today were normal.  Your blood pressure ranged from normal to mildly elevated. Please call your primary care provider to schedule a follow up appointment. They will likely monitor your blood pressure and can discuss with you whether or not blood pressure medication would be beneficial. I will hold off on prescribing you any new medications for now. Return to the ER for new or worsening symptoms.   Hypertension Hypertension, commonly called high blood pressure, is when the force of blood pumping through your arteries is too strong. Your arteries are the blood vessels that carry blood from your heart throughout your body. A blood pressure reading consists of a higher number over a lower number, such as 110/72. The higher number (systolic) is the pressure inside your arteries when your heart pumps. The lower number (diastolic) is the pressure inside your arteries when your heart relaxes. Ideally you want your blood pressure below 120/80. Hypertension forces your heart to work harder to pump blood. Your arteries may become narrow or stiff. Having untreated or uncontrolled hypertension can cause heart attack, stroke, kidney disease, and other problems. RISK FACTORS Some risk factors for high blood pressure are controllable. Others are not.  Risk factors you cannot control include:   Race. You may be at higher risk if you are African American.  Age. Risk increases with age.  Gender. Men are at higher risk than women before age 42 years. After age 83, women are at higher risk than men. Risk factors you can control include:  Not getting enough exercise or physical activity.  Being overweight.  Getting too much fat, sugar, calories, or salt in your diet.  Drinking too much alcohol. SIGNS AND SYMPTOMS Hypertension does not usually cause signs or symptoms. Extremely high blood pressure  (hypertensive crisis) may cause headache, anxiety, shortness of breath, and nosebleed. DIAGNOSIS To check if you have hypertension, your health care provider will measure your blood pressure while you are seated, with your arm held at the level of your heart. It should be measured at least twice using the same arm. Certain conditions can cause a difference in blood pressure between your right and left arms. A blood pressure reading that is higher than normal on one occasion does not mean that you need treatment. If it is not clear whether you have high blood pressure, you may be asked to return on a different day to have your blood pressure checked again. Or, you may be asked to monitor your blood pressure at home for 1 or more weeks. TREATMENT Treating high blood pressure includes making lifestyle changes and possibly taking medicine. Living a healthy lifestyle can help lower high blood pressure. You may need to change some of your habits. Lifestyle changes may include:  Following the DASH diet. This diet is high in fruits, vegetables, and whole grains. It is low in salt, red meat, and added sugars.  Keep your sodium intake below 2,300 mg per day.  Getting at least 30-45 minutes of aerobic exercise at least 4 times per week.  Losing weight if necessary.  Not smoking.  Limiting alcoholic beverages.  Learning ways to reduce stress. Your health care provider may prescribe medicine if lifestyle changes are not enough to get your blood pressure under control, and if one of the following is true:  You are 75-68 years of age and your systolic blood pressure is above 140.  You are 67 years of age or older, and your systolic blood pressure is above 150.  Your diastolic blood pressure is above 90.  You have diabetes, and your systolic blood pressure is over [id number] or your diastolic blood pressure is over 90.  You have kidney disease and your blood pressure is above 140/90.  You have heart disease  and your blood pressure is above 140/90. Your personal target blood pressure may vary depending on your medical conditions, your age, and other factors. HOME CARE INSTRUCTIONS  Have your blood pressure rechecked as directed by your health care provider.   Take medicines only as directed by your health care provider. Follow the directions carefully. Blood pressure medicines must be taken as prescribed. The medicine does not work as well when you skip doses. Skipping doses also puts you at risk for problems.  Do not smoke.   Monitor your blood pressure at home as directed by your health care provider. SEEK MEDICAL CARE IF:   You think you are having a reaction to medicines taken.  You have recurrent headaches or feel dizzy.  You have swelling in your ankles.  You have trouble with your vision. SEEK IMMEDIATE MEDICAL CARE IF:  You develop a severe headache or confusion.  You have unusual weakness, numbness, or feel faint.  You have severe chest or abdominal pain.  You vomit repeatedly.  You have trouble breathing. MAKE SURE YOU:   Understand these instructions.  Will watch your condition.  Will get help right away if you are not doing well or get worse.   This information is not intended to replace advice given to you by your health care provider. Make sure you discuss any questions you have with your health care provider.   Document Released: 10/22/2005 Document Revised: 03/08/2015 Document Reviewed: 08/14/2013 Elsevier Interactive Patient Education [employer].

## 2016-02-27 ENCOUNTER — Encounter (HOSPITAL_BASED_OUTPATIENT_CLINIC_OR_DEPARTMENT_OTHER): Payer: Self-pay | Admitting: Emergency Medicine

## 2016-02-27 ENCOUNTER — Emergency Department (HOSPITAL_BASED_OUTPATIENT_CLINIC_OR_DEPARTMENT_OTHER)
Admission: EM | Admit: 2016-02-27 | Discharge: 2016-02-27 | Disposition: A | Discharge status: Home / Self Care | Attending: Emergency Medicine | Admitting: Emergency Medicine

## 2016-02-27 DIAGNOSIS — J45909 Unspecified asthma, uncomplicated: Secondary | ICD-10-CM | POA: Insufficient documentation

## 2016-02-27 DIAGNOSIS — G43909 Migraine, unspecified, not intractable, without status migrainosus: Secondary | ICD-10-CM | POA: Diagnosis not present

## 2016-02-27 DIAGNOSIS — R0789 Other chest pain: Secondary | ICD-10-CM | POA: Insufficient documentation

## 2016-02-27 DIAGNOSIS — Z79899 Other long term (current) drug therapy: Secondary | ICD-10-CM | POA: Insufficient documentation

## 2016-02-27 DIAGNOSIS — I10 Essential (primary) hypertension: Secondary | ICD-10-CM | POA: Insufficient documentation

## 2016-02-27 DIAGNOSIS — F410 Panic disorder [episodic paroxysmal anxiety] without agoraphobia: Secondary | ICD-10-CM | POA: Diagnosis not present

## 2016-02-27 DIAGNOSIS — R079 Chest pain, unspecified: Secondary | ICD-10-CM | POA: Diagnosis present

## 2016-02-27 DIAGNOSIS — Z3202 Encounter for pregnancy test, result negative: Secondary | ICD-10-CM | POA: Diagnosis not present

## 2016-02-27 DIAGNOSIS — Z87440 Personal history of urinary (tract) infections: Secondary | ICD-10-CM | POA: Diagnosis not present

## 2016-02-27 DIAGNOSIS — Z7982 Long term (current) use of aspirin: Secondary | ICD-10-CM | POA: Diagnosis not present

## 2016-02-27 DIAGNOSIS — Z8742 Personal history of other diseases of the female genital tract: Secondary | ICD-10-CM | POA: Diagnosis not present

## 2016-02-27 DIAGNOSIS — R531 Weakness: Secondary | ICD-10-CM | POA: Insufficient documentation

## 2016-02-27 DIAGNOSIS — Z87448 Personal history of other diseases of urinary system: Secondary | ICD-10-CM | POA: Insufficient documentation

## 2016-02-27 DIAGNOSIS — Z7952 Long term (current) use of systemic steroids: Secondary | ICD-10-CM | POA: Diagnosis not present

## 2016-02-27 DIAGNOSIS — Z8739 Personal history of other diseases of the musculoskeletal system and connective tissue: Secondary | ICD-10-CM | POA: Diagnosis not present

## 2016-02-27 LAB — BASIC METABOLIC PANEL
Anion gap: 10 (ref 5–15)
BUN: 11 mg/dL (ref 6–20)
CHLORIDE: 105 mmol/L (ref 101–111)
CO2: 23 mmol/L (ref 22–32)
CREATININE: 0.42 mg/dL — AB (ref 0.44–1.00)
Calcium: 8.7 mg/dL — ABNORMAL LOW (ref 8.9–10.3)
GFR calc Af Amer: >60 mL/min (ref >60)
GFR calc non Af Amer: >60 mL/min (ref >60)
Glucose, Bld: 85 mg/dL (ref 65–99)
Potassium: 4 mmol/L (ref 3.5–5.1)
Sodium: 138 mmol/L (ref 135–145)

## 2016-02-27 LAB — URINALYSIS, ROUTINE W REFLEX MICROSCOPIC
BILIRUBIN URINE: NEGATIVE
Glucose, UA: NEGATIVE mg/dL
HGB URINE DIPSTICK: NEGATIVE
Ketones, ur: NEGATIVE mg/dL
Leukocytes, UA: NEGATIVE
Nitrite: NEGATIVE
PH: 8 (ref 5.0–8.0)
Protein, ur: NEGATIVE mg/dL
SPECIFIC GRAVITY, URINE: 1.009 (ref 1.005–1.030)

## 2016-02-27 LAB — TROPONIN I

## 2016-02-27 LAB — PREGNANCY, URINE: Preg Test, Ur: NEGATIVE

## 2016-02-27 NOTE — Discharge Instructions (Signed)
Make sure to drink plenty of fluids. Eat a well-balanced diet. Get plenty of rest. Please follow-up with primary care doctor for further evaluation and recheck. Please follow with cardiology regarding your chest pains. Return if worsening symptoms   Nonspecific Chest Pain  Chest pain can be caused by many different conditions. There is always a chance that your pain could be related to something serious, such as a heart attack or a blood clot in your lungs. Chest pain can also be caused by conditions that are not life-threatening. If you have chest pain, it is very important to follow up with your health care provider. CAUSES  Chest pain can be caused by:  Heartburn.  Pneumonia or bronchitis.  Anxiety or stress.  Inflammation around your heart (pericarditis) or lung (pleuritis or pleurisy).  A blood clot in your lung.  A collapsed lung (pneumothorax). It can develop suddenly on its own (spontaneous pneumothorax) or from trauma to the chest.  Shingles infection (varicella-zoster virus).  Heart attack.  Damage to the bones, muscles, and cartilage that make up your chest wall. This can include:  Bruised bones due to injury.  Strained muscles or cartilage due to frequent or repeated coughing or overwork.  Fracture to one or more ribs.  Sore cartilage due to inflammation (costochondritis). RISK FACTORS  Risk factors for chest pain may include:  Activities that increase your risk for trauma or injury to your chest.  Respiratory infections or conditions that cause frequent coughing.  Medical conditions or overeating that can cause heartburn.  Heart disease or family history of heart disease.  Conditions or health behaviors that increase your risk of developing a blood clot.  Having had chicken pox (varicella zoster). SIGNS AND SYMPTOMS Chest pain can feel like:  Burning or tingling on the surface of your chest or deep in your chest.  Crushing, pressure, aching, or  squeezing pain.  Dull or sharp pain that is worse when you move, cough, or take a deep breath.  Pain that is also felt in your back, neck, shoulder, or arm, or pain that spreads to any of these areas. Your chest pain may come and go, or it may stay constant. DIAGNOSIS Lab tests or other studies may be needed to find the cause of your pain. Your health care provider may have you take a test called an ambulatory ECG (electrocardiogram). An ECG records your heartbeat patterns at the time the test is performed. You may also have other tests, such as:  Transthoracic echocardiogram (TTE). During echocardiography, sound waves are used to create a picture of all of the heart structures and to look at how blood flows through your heart.  Transesophageal echocardiogram (TEE).This is a more advanced imaging test that obtains images from inside your body. It allows your health care provider to see your heart in finer detail.  Cardiac monitoring. This allows your health care provider to monitor your heart rate and rhythm in real time.  Holter monitor. This is a portable device that records your heartbeat and can help to diagnose abnormal heartbeats. It allows your health care provider to track your heart activity for several days, if needed.  Stress tests. These can be done through exercise or by taking medicine that makes your heart beat more quickly.  Blood tests.  Imaging tests. TREATMENT  Your treatment depends on what is causing your chest pain. Treatment may include:  Medicines. These may include:  Acid blockers for heartburn.  Anti-inflammatory medicine.  Pain medicine for inflammatory  conditions.  Antibiotic medicine, if an infection is present.  Medicines to dissolve blood clots.  Medicines to treat coronary artery disease.  Supportive care for conditions that do not require medicines. This may include:  Resting.  Applying heat or cold packs to injured areas.  Limiting  activities until pain decreases. HOME CARE INSTRUCTIONS  If you were prescribed an antibiotic medicine, finish it all even if you start to feel better.  Avoid any activities that bring on chest pain.  Do not use any tobacco products, including cigarettes, chewing tobacco, or electronic cigarettes. If you need help quitting, ask your health care provider.  Do not drink alcohol.  Take medicines only as directed by your health care provider.  Keep all follow-up visits as directed by your health care provider. This is important. This includes any further testing if your chest pain does not go away.  If heartburn is the cause for your chest pain, you may be told to keep your head raised (elevated) while sleeping. This reduces the chance that acid will go from your stomach into your esophagus.  Make lifestyle changes as directed by your health care provider. These may include:  Getting regular exercise. Ask your health care provider to suggest some activities that are safe for you.  Eating a heart-healthy diet. A registered dietitian can help you to learn healthy eating options.  Maintaining a healthy weight.  Managing diabetes, if necessary.  Reducing stress. SEEK MEDICAL CARE IF:  Your chest pain does not go away after treatment.  You have a rash with blisters on your chest.  You have a fever. SEEK IMMEDIATE MEDICAL CARE IF:   Your chest pain is worse.  You have an increasing cough, or you cough up blood.  You have severe abdominal pain.  You have severe weakness.  You faint.  You have chills.  You have sudden, unexplained chest discomfort.  You have sudden, unexplained discomfort in your arms, back, neck, or jaw.  You have shortness of breath at any time.  You suddenly start to sweat, or your skin gets clammy.  You feel nauseous or you vomit.  You suddenly feel light-headed or dizzy.  Your heart begins to beat quickly, or it feels like it is skipping  beats. These symptoms may represent a serious problem that is an emergency. Do not wait to see if the symptoms will go away. Get medical help right away. Call your local emergency services (911 in the [place] Do not drive yourself to the hospital.   This information is not intended to replace advice given to you by your health care provider. Make sure you discuss any questions you have with your health care provider.   Document Released: 08/01/2005 Document Revised: 11/12/2014 Document Reviewed: 05/28/2014 Elsevier Interactive Patient Education [employer].

## 2016-02-27 NOTE — ED Notes (Signed)
Seen x4 days ago  For endo states was hypertensive then  Headache next day  With chest and left arm pain was seen at [facility] ED  And sent home.  Has continued with nausea and elevated B/P.  This am had left arm pain again and feels funny in head

## 2016-02-27 NOTE — ED Notes (Signed)
Elevated BP

## 2016-02-27 NOTE — ED Provider Notes (Signed)
CSN: [id number]     Arrival date & time 02/27/16  0935 History   First MD Initiated Contact with Patient 02/27/16 [phone]     Chief Complaint  Patient presents with  . Hypertension     (Consider location/radiation/quality/duration/timing/severity/associated sxs/prior Treatment) HPI [name] is a 40 y.o. female with history of mitral valve prolapse, hypertension, vertigo, panic attacks, presents to ED with complaint of chest pain, left shoulder pain, radiating down left arm, generalized malaise, headache, hypertension. Pt states she had an endoscopy done for chronic nauesa and constipation, states she was told it looked normal. States at that time, was told her BP was 140s/80. States was told to follow up with pcp. Pt states it made her nervous, states she was seen in emergency department 4 days ago. She was released, with no acute findings at that time. She states that this pain that she continues to have in her chest and radiating down left arm has been going on for "months." She states it comes and goes lasting several minutes. She states that it is non exertional. Occurs at random. No pleuritic symptoms. No recent travel or surgeries other than endoscopy.   Past Medical History  Diagnosis Date  . Panic attacks   . Scoliosis   . Mitral valve prolapse   . Asthma     As a child  . Renal disorder   . UTI (lower urinary tract infection)   . Hypertension   . Ovarian cyst   . Migraine   . Vertigo   . Degenerative disk disease   . Palpitations    Past Surgical History  Procedure Laterality Date  . Tubal ligation    . Laparoscopy    . Cesarean section    . Cholecystectomy    . Knee surgery    . Back surgery    . Radio frequency albiation     . Hysteroscopy w/d&c N/A 01/09/2016    Procedure: DILATATION AND CURETTAGE /HYSTEROSCOPY/endometrial pap smear;  Surgeon: Benjaman Kindler, MD;  Location: [facility] ORS;  Service: Gynecology;  Laterality: N/A;  . Cystoscopy  01/09/2016    Procedure:  CYSTOSCOPY;  Surgeon: Benjaman Kindler, MD;  Location: [facility] ORS;  Service: Gynecology;;  . Esophagogastroduodenoscopy endoscopy     Family History  Problem Relation Age of Onset  . Hypertension Mother   . Diabetes Mother   . Stroke Mother   . Heart disease Mother   . Hypertension Father   . Diabetes Father   . Dementia Father   . Schizophrenia Father    Social History  Substance Use Topics  . Smoking status: Never Smoker   . Smokeless tobacco: Never Used  . Alcohol Use: No   OB History    Gravida Para Term Preterm AB TAB SAB Ectopic Multiple Living   3 3 2 1      3      Review of Systems  Constitutional: Negative for fever and chills.  Respiratory: Positive for chest tightness. Negative for cough and shortness of breath.   Cardiovascular: Positive for chest pain. Negative for palpitations and leg swelling.  Gastrointestinal: Negative for nausea, vomiting, abdominal pain and diarrhea.  Genitourinary: Negative for dysuria, flank pain and pelvic pain.  Musculoskeletal: Positive for arthralgias. Negative for myalgias, neck pain and neck stiffness.  Skin: Negative for rash.  Neurological: Negative for dizziness, weakness and headaches.  All other systems reviewed and are negative.     Allergies  Sulfonamide derivatives; Ciprofloxacin; Dilaudid; Morphine and related; Norvasc; and Amlodipine  besylate  Home Medications   Prior to Admission medications   Medication Sig Start Date End Date Taking? Authorizing Provider  Ascorbic Acid (VITAMIN C) 1000 MG tablet Take 1,000 mg by mouth daily.    Historical Provider, MD  aspirin 81 MG chewable tablet Chew 81 mg by mouth daily. Reported on 01/09/2016    Historical Provider, MD  benzonatate (TESSALON PERLES) 100 MG capsule Take 1 capsule (100 mg total) by mouth 3 (three) times daily as needed for cough. 02/06/16   Shary Decamp, PA-C  BIOTIN PO Take 1 tablet by mouth daily.    Historical Provider, MD  butalbital-acetaminophen-caffeine  (FIORICET, ESGIC) 50-325-40 MG tablet Take 1 tablet by mouth every 4 (four) hours. 09/20/15   Nita Sells, MD  chlorhexidine (PERIDEX) 0.12 % solution 15 mLs by Mouth Rinse route 2 (two) times daily. 09/20/15   Nita Sells, MD  clotrimazole-betamethasone (LOTRISONE) cream Apply 1 application topically 2 (two) times daily. To left foot and between toes 11/24/15   Historical Provider, MD  CRANBERRY PO Take 1 tablet by mouth daily.    Historical Provider, MD  cyclobenzaprine (FLEXERIL) 10 MG tablet Take 1 tablet (10 mg total) by mouth 3 (three) times daily as needed for muscle spasms. 09/20/15   Nita Sells, MD  ECHINACEA PO Take 1 tablet by mouth daily.    Historical Provider, MD  ergocalciferol (VITAMIN D2) 50000 units capsule Take 50,000 Units by mouth once a week.    Historical Provider, MD  Ferrous Sulfate (IRON) 28 MG TABS Take 1 tablet by mouth daily as needed (monthly bleeding).     Historical Provider, MD  ibuprofen (ADVIL,MOTRIN) 200 MG tablet Take 2 tablets (400 mg total) by mouth every 6 (six) hours as needed for headache or moderate pain. 09/05/15   Harvel Quale, MD  meclizine (ANTIVERT) 25 MG tablet Take 25 mg by mouth 4 (four) times daily as needed for dizziness.    Historical Provider, MD  meloxicam (MOBIC) 15 MG tablet Take 7.5 mg by mouth daily as needed for pain.     Historical Provider, MD  nitrofurantoin, macrocrystal-monohydrate, (MACROBID) 100 MG capsule Take 1 capsule (100 mg total) by mouth every 12 (twelve) hours. Patient not taking: Reported on 01/09/2016 09/20/15   Nita Sells, MD  nitroGLYCERIN (NITROSTAT) 0.4 MG SL tablet Place 0.4 mg under the tongue every 5 (five) minutes as needed for chest pain.     Historical Provider, MD  Omega-3 Fatty Acids (FISH OIL) 1000 MG CAPS Take 1 capsule by mouth daily.    Historical Provider, MD  polyethylene glycol (MIRALAX / GLYCOLAX) packet Take 17 g by mouth daily. Patient taking differently: Take 17 g by  mouth daily as needed for moderate constipation.  09/20/15   Nita Sells, MD  Probiotic Product (PROBIOTIC PO) Take 1 capsule by mouth daily.    Historical Provider, MD  promethazine (PHENERGAN) 25 MG tablet Take 25 mg by mouth every 6 (six) hours as needed for nausea or vomiting.    Historical Provider, MD  traMADol (ULTRAM) 50 MG tablet Take 1 tablet (50 mg total) by mouth every 6 (six) hours as needed. 09/20/15   Nita Sells, MD   BP 119/92 mmHg  Pulse 74  Temp(Src) 98.1 F (36.7 C) (Oral)  Ht 5' 3.5" (1.613 m)  Wt 92.987 kg  BMI 35.74 kg/m2  SpO2 100%  LMP 02/16/2016 Physical Exam  Constitutional: She appears well-developed and well-nourished. No distress.  HENT:  Head: Normocephalic.  Eyes: Conjunctivae are  normal.  Neck: Neck supple.  Cardiovascular: Normal rate, regular rhythm and normal heart sounds.   Pulmonary/Chest: Effort normal and breath sounds normal. No respiratory distress. She has no wheezes. She has no rales.  Musculoskeletal: She exhibits no edema.  Neurological: She is alert.  Skin: Skin is warm and dry.  Psychiatric: She has a normal mood and affect. Her behavior is normal.  Nursing note and vitals reviewed.   ED Course  Procedures (including critical care time) Labs Review Labs Reviewed  BASIC METABOLIC PANEL - Abnormal; Notable for the following:    Creatinine, Ser 0.42 (*)    Calcium 8.7 (*)    All other components within normal limits  TROPONIN I  URINALYSIS, ROUTINE W REFLEX MICROSCOPIC (NOT AT [facility])  PREGNANCY, URINE    Imaging Review No results found. I have personally reviewed and evaluated these images and lab results as part of my medical decision-making.   EKG Interpretation   Date/Time:  Monday February 27 2016 09:55:26 EDT Ventricular Rate:  80 PR Interval:  166 QRS Duration: 100 QT Interval:  386 QTC Calculation: 445 R Axis:   57 Text Interpretation:  Sinus rhythm No significant change since last  tracing  Confirmed by Maryan Rued  MD, Loree Fee ([id number]) on 02/27/2016 10:25:58  AM      MDM   Final diagnoses:  Chest pain, unspecified chest pain type  Weakness   Patient emergency department because she is worried about her blood pressure at home, states also having headache, on and off chest pain for several months that radiates into the left hand, nausea, constipation, flank pain. She was seen 3 days ago for the same, no findings on lab work, she was discharged home. She reports bad because her symptoms persist. Patient's vital signs in emergency department today are normal. Her blood pressure is 119/85. She is afebrile. She is not tachycardic, not hypoxic or tach. She is not on any birth control. I did an endoscopy, she denies any recent surgeries or travel. She is low risk for PE, and her symptoms do not fit the picture. Patient's chest pain is nonexertional, atypical in presentation. We'll recheck troponin, CBC was obtained just 3 days ago, do not think she needs repeat. Will check electrolytes. We will also get urinalysis. Patient does have urinary sepsis history.   12:47 PM Patient's blood work all within normal. Troponin is negative. Urinalysis is negative. She continues to be normotensive, and is in no acute distress. She is stable for discharge home at this time. She is requesting cardiology follow-up, will [facility]. She will need to follow with her primary care doctor as well.  Filed Vitals:   02/27/16 1011 02/27/16 1201  BP: 119/92 132/88  Pulse: 74 74  Temp: 98.1 F (36.7 C)   TempSrc: Oral   Resp:  18  Height: 5' 3.5" (1.613 m)   Weight: 92.987 kg   SpO2: 100% 100%       Jeannett Senior, PA-C 02/27/16 [place], MD 02/27/16 1636

## 2016-03-06 ENCOUNTER — Ambulatory Visit: Admitting: Obstetrics

## 2016-03-19 ENCOUNTER — Ambulatory Visit: Attending: Obstetrics and Gynecology | Admitting: Physical Therapy

## 2016-03-21 ENCOUNTER — Emergency Department (HOSPITAL_COMMUNITY)
Admission: EM | Admit: 2016-03-21 | Discharge: 2016-03-22 | Disposition: A | Discharge status: Home / Self Care | Attending: Emergency Medicine | Admitting: Emergency Medicine

## 2016-03-21 DIAGNOSIS — S161XXA Strain of muscle, fascia and tendon at neck level, initial encounter: Secondary | ICD-10-CM | POA: Insufficient documentation

## 2016-03-21 DIAGNOSIS — S39012A Strain of muscle, fascia and tendon of lower back, initial encounter: Secondary | ICD-10-CM | POA: Diagnosis not present

## 2016-03-21 DIAGNOSIS — Y999 Unspecified external cause status: Secondary | ICD-10-CM | POA: Insufficient documentation

## 2016-03-21 DIAGNOSIS — I10 Essential (primary) hypertension: Secondary | ICD-10-CM | POA: Insufficient documentation

## 2016-03-21 DIAGNOSIS — Y939 Activity, unspecified: Secondary | ICD-10-CM | POA: Insufficient documentation

## 2016-03-21 DIAGNOSIS — J45909 Unspecified asthma, uncomplicated: Secondary | ICD-10-CM | POA: Diagnosis not present

## 2016-03-21 DIAGNOSIS — S299XXA Unspecified injury of thorax, initial encounter: Secondary | ICD-10-CM | POA: Diagnosis present

## 2016-03-21 DIAGNOSIS — Y92410 Unspecified street and highway as the place of occurrence of the external cause: Secondary | ICD-10-CM | POA: Insufficient documentation

## 2016-03-22 ENCOUNTER — Encounter (HOSPITAL_COMMUNITY): Payer: Self-pay | Admitting: Emergency Medicine

## 2016-03-22 ENCOUNTER — Emergency Department (HOSPITAL_COMMUNITY)

## 2016-03-22 LAB — POC URINE PREG, ED: Preg Test, Ur: NEGATIVE

## 2016-03-22 MED ORDER — FENTANYL CITRATE (PF) 100 MCG/2ML IJ SOLN
50.0000 ug | Freq: Once | INTRAMUSCULAR | Status: AC
  Administered 2016-03-22: 50 ug via INTRAVENOUS
  Filled 2016-03-22: qty 2

## 2016-03-22 MED ORDER — ONDANSETRON 4 MG PO TBDP
4.0000 mg | ORAL_TABLET | Freq: Once | ORAL | Status: AC | PRN
  Administered 2016-03-22: 4 mg via ORAL
  Filled 2016-03-22: qty 1

## 2016-03-22 MED ORDER — OXYCODONE-ACETAMINOPHEN 5-325 MG PO TABS: 1.0000 | ORAL_TABLET | Freq: Four times a day (QID) | ORAL | Status: DC | PRN

## 2016-03-22 MED ORDER — METHOCARBAMOL 500 MG PO TABS: 500.0000 mg | ORAL_TABLET | Freq: Two times a day (BID) | ORAL | Status: DC | PRN

## 2016-03-22 MED ORDER — OXYCODONE-ACETAMINOPHEN 5-325 MG PO TABS
1.0000 | ORAL_TABLET | ORAL | Status: DC | PRN
  Administered 2016-03-22: 1 via ORAL
  Filled 2016-03-22 (×2): qty 1

## 2016-03-22 MED ORDER — DIAZEPAM 5 MG/ML IJ SOLN
2.5000 mg | Freq: Once | INTRAMUSCULAR | Status: AC
  Administered 2016-03-22: 2.5 mg via INTRAVENOUS
  Filled 2016-03-22: qty 2

## 2016-03-22 MED ORDER — OXYCODONE-ACETAMINOPHEN 5-325 MG PO TABS
2.0000 | ORAL_TABLET | Freq: Once | ORAL | Status: AC
  Administered 2016-03-22: 2 via ORAL
  Filled 2016-03-22: qty 2

## 2016-03-22 MED ORDER — KETOROLAC TROMETHAMINE 30 MG/ML IJ SOLN
30.0000 mg | Freq: Once | INTRAMUSCULAR | Status: AC
  Administered 2016-03-22: 30 mg via INTRAVENOUS
  Filled 2016-03-22: qty 1

## 2016-03-22 MED ORDER — NAPROXEN 500 MG PO TABS: 500.0000 mg | ORAL_TABLET | Freq: Two times a day (BID) | ORAL | Status: DC

## 2016-03-22 MED ORDER — METOCLOPRAMIDE HCL 5 MG/ML IJ SOLN
10.0000 mg | INTRAMUSCULAR | Status: AC
  Administered 2016-03-22: 10 mg via INTRAVENOUS
  Filled 2016-03-22: qty 2
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
## 2016-03-22 NOTE — ED Notes (Signed)
Went in to eval pt and pt asks if I can wait a min. And come back when she is in a gown.

## 2016-03-22 NOTE — ED Notes (Signed)
Pt resting quietly.  NAD.  Family at bedside.

## 2016-06-09 ENCOUNTER — Emergency Department (HOSPITAL_COMMUNITY)

## 2016-06-09 ENCOUNTER — Encounter (HOSPITAL_COMMUNITY): Payer: Self-pay | Admitting: *Deleted

## 2016-06-09 ENCOUNTER — Emergency Department (HOSPITAL_COMMUNITY)
Admission: EM | Admit: 2016-06-09 | Discharge: 2016-06-09 | Disposition: A | Discharge status: Home / Self Care | Attending: Emergency Medicine | Admitting: Emergency Medicine

## 2016-06-09 DIAGNOSIS — Z791 Long term (current) use of non-steroidal anti-inflammatories (NSAID): Secondary | ICD-10-CM | POA: Diagnosis not present

## 2016-06-09 DIAGNOSIS — R42 Dizziness and giddiness: Secondary | ICD-10-CM | POA: Insufficient documentation

## 2016-06-09 DIAGNOSIS — I10 Essential (primary) hypertension: Secondary | ICD-10-CM | POA: Insufficient documentation

## 2016-06-09 DIAGNOSIS — Z79899 Other long term (current) drug therapy: Secondary | ICD-10-CM | POA: Diagnosis not present

## 2016-06-09 DIAGNOSIS — J45909 Unspecified asthma, uncomplicated: Secondary | ICD-10-CM | POA: Insufficient documentation

## 2016-06-09 DIAGNOSIS — R110 Nausea: Secondary | ICD-10-CM | POA: Diagnosis not present

## 2016-06-09 DIAGNOSIS — R51 Headache: Secondary | ICD-10-CM | POA: Diagnosis not present

## 2016-06-09 LAB — CBC WITH DIFFERENTIAL/PLATELET
BASOS PCT: 1 %
Basophils Absolute: 0 10*3/uL (ref 0.0–0.1)
Eosinophils Absolute: 0.2 10*3/uL (ref 0.0–0.7)
Eosinophils Relative: 6 %
HEMATOCRIT: 33.8 % — AB (ref 36.0–46.0)
Hemoglobin: 10.8 g/dL — ABNORMAL LOW (ref 12.0–15.0)
Lymphocytes Relative: 31 %
Lymphs Abs: 1.1 10*3/uL (ref 0.7–4.0)
MCH: 26.3 pg (ref 26.0–34.0)
MCHC: 32 g/dL (ref 30.0–36.0)
MCV: 82.2 fL (ref 78.0–100.0)
MONO ABS: 0.4 10*3/uL (ref 0.1–1.0)
MONOS PCT: 10 %
NEUTROS ABS: 1.9 10*3/uL (ref 1.7–7.7)
Neutrophils Relative %: 52 %
Platelets: 407 10*3/uL — ABNORMAL HIGH (ref 150–400)
RBC: 4.11 MIL/uL (ref 3.87–5.11)
RDW: 15 % (ref 11.5–15.5)
WBC: 3.6 10*3/uL — ABNORMAL LOW (ref 4.0–10.5)

## 2016-06-09 LAB — COMPREHENSIVE METABOLIC PANEL
ALBUMIN: 3.6 g/dL (ref 3.5–5.0)
ALK PHOS: 54 U/L (ref 38–126)
ALT: 14 U/L (ref 14–54)
ANION GAP: 7 (ref 5–15)
AST: 16 U/L (ref 15–41)
BUN: 11 mg/dL (ref 6–20)
CALCIUM: 8.4 mg/dL — AB (ref 8.9–10.3)
CO2: 26 mmol/L (ref 22–32)
CREATININE: 0.58 mg/dL (ref 0.44–1.00)
Chloride: 105 mmol/L (ref 101–111)
GFR calc Af Amer: >60 mL/min (ref >60)
GFR calc non Af Amer: >60 mL/min (ref >60)
GLUCOSE: 102 mg/dL — AB (ref 65–99)
Potassium: 3.6 mmol/L (ref 3.5–5.1)
SODIUM: 138 mmol/L (ref 135–145)
Total Bilirubin: 0.4 mg/dL (ref 0.3–1.2)
Total Protein: 7.1 g/dL (ref 6.5–8.1)

## 2016-06-09 LAB — URINE MICROSCOPIC-ADD ON
RBC / HPF: NONE SEEN RBC/hpf (ref 0–5)
WBC, UA: NONE SEEN WBC/hpf (ref 0–5)

## 2016-06-09 LAB — URINALYSIS, ROUTINE W REFLEX MICROSCOPIC
Bilirubin Urine: NEGATIVE
Glucose, UA: NEGATIVE mg/dL
Ketones, ur: NEGATIVE mg/dL
LEUKOCYTES UA: NEGATIVE
NITRITE: NEGATIVE
Protein, ur: NEGATIVE mg/dL
Specific Gravity, Urine: 1.011 (ref 1.005–1.030)
pH: 7.5 (ref 5.0–8.0)

## 2016-06-09 LAB — I-STAT BETA HCG BLOOD, ED (MC, WL, AP ONLY): I-stat hCG, quantitative: <5 m[IU]/mL (ref <5)

## 2016-06-09 MED ORDER — MECLIZINE HCL 25 MG PO TABS
25.0000 mg | ORAL_TABLET | Freq: Once | ORAL | Status: AC
  Administered 2016-06-09: 25 mg via ORAL
  Filled 2016-06-09: qty 1

## 2016-06-09 MED ORDER — KETOROLAC TROMETHAMINE 30 MG/ML IJ SOLN
15.0000 mg | Freq: Once | INTRAMUSCULAR | Status: AC
  Administered 2016-06-09: 15 mg via INTRAVENOUS
  Filled 2016-06-09: qty 1

## 2016-06-09 MED ORDER — LORAZEPAM 2 MG/ML IJ SOLN
1.0000 mg | Freq: Once | INTRAMUSCULAR | Status: AC
Start: 2016-06-09 — End: 2016-06-09
  Administered 2016-06-09: 1 mg via INTRAVENOUS
  Filled 2016-06-09: qty 1

## 2016-06-09 MED ORDER — SODIUM CHLORIDE 0.9 % IV BOLUS (SEPSIS)
1000.0000 mL | Freq: Once | INTRAVENOUS | Status: AC
  Administered 2016-06-09: 1000 mL via INTRAVENOUS

## 2016-06-09 MED ORDER — MECLIZINE HCL 25 MG PO TABS: 25.0000 mg | ORAL_TABLET | Freq: Three times a day (TID) | ORAL | 0 refills | Status: AC

## 2016-06-09 MED ORDER — LORAZEPAM 1 MG PO TABS: 1.0000 mg | ORAL_TABLET | Freq: Three times a day (TID) | ORAL | 0 refills | Status: DC | PRN

## 2016-06-09 NOTE — ED Provider Notes (Signed)
[place] DEPT Provider Note   CSN: [id number] Arrival date & time: 06/09/16  [id number] History   Chief Complaint Chief Complaint  Patient presents with  . Dizziness    HPI [name] is a 40 y.o. female.  HPI Patient presents with concern of headache, dizziness. Today the patient had a severe episode of dizziness, noticeable upon awakening, with no improvement in spite of taking meclizine. This is a typical remedy for her, and usually her symptoms improve substantially. However, today the patient's dizziness has been persistent. Also unusual for the patient is a diffuse severe posterior headache. She does acknowledge a history of migraines, states that this is an unusual combination of events. She has a notable history of evaluation at [facility] for her persistent dizziness. She has been diagnosed with right vestibular dysfunction. Currently she denies other complaints, including confusion, disorientation, vision changes, chest pain, belly pain.  Past Medical History:  Diagnosis Date  . Asthma    As a child  . Degenerative disk disease   . Hypertension   . Migraine   . Mitral valve prolapse   . Ovarian cyst   . Palpitations   . Panic attacks   . Renal disorder   . Scoliosis   . UTI (lower urinary tract infection)   . Vertigo     Patient Active Problem List   Diagnosis Date Noted  . Pelvic pain in female 01/09/2016  . Panic attacks 09/15/2015  . Hypertension 09/15/2015  . Scoliosis 09/15/2015  . Renal disorder 09/15/2015  . Asthma 09/15/2015  . Sepsis due Pyelonephritis   09/15/2015  . Pyelonephritis 09/15/2015  . BV (bacterial vaginosis) 03/22/2014  . Unspecified symptom associated with female genital organs 03/08/2014  . Unspecified disorder of menstruation and other abnormal bleeding from female genital tract 03/08/2014  . Mitral valve disorder 07/17/2010  . PALPITATIONS 07/17/2010  . PRECORDIAL PAIN 07/17/2010  . OTH NONSPECIFIC ABNORM CV SYSTEM  FUNCTION STUDY 07/17/2010    Past Surgical History:  Procedure Laterality Date  . BACK SURGERY    . CESAREAN SECTION    . CHOLECYSTECTOMY    . CYSTOSCOPY  01/09/2016   Procedure: CYSTOSCOPY;  Surgeon: Benjaman Kindler, MD;  Location: [facility] ORS;  Service: Gynecology;;  . ESOPHAGOGASTRODUODENOSCOPY ENDOSCOPY    . HYSTEROSCOPY W/D&C N/A 01/09/2016   Procedure: DILATATION AND CURETTAGE /HYSTEROSCOPY/endometrial pap smear;  Surgeon: Benjaman Kindler, MD;  Location: [facility] ORS;  Service: Gynecology;  Laterality: N/A;  . KNEE SURGERY    . LAPAROSCOPY    . Radio Frequency Albiation     . TUBAL LIGATION      OB History    Gravida Para Term Preterm AB Living   3 3 2 1   3    SAB TAB Ectopic Multiple Live Births           3       Home Medications    Prior to Admission medications   Medication Sig Start Date End Date Taking? Authorizing Provider  Ascorbic Acid (VITAMIN C) 1000 MG tablet Take 1,000 mg by mouth daily.    Historical Provider, MD  benzonatate (TESSALON PERLES) 100 MG capsule Take 1 capsule (100 mg total) by mouth 3 (three) times daily as needed for cough. 02/06/16   Shary Decamp, PA-C  butalbital-acetaminophen-caffeine (FIORICET, ESGIC) [phone] MG tablet Take 1 tablet by mouth every 4 (four) hours. 09/20/15   Nita Sells, MD  chlorhexidine (PERIDEX) 0.12 % solution 15 mLs by Mouth Rinse route 2 (two) times daily. 09/20/15  Nita Sells, MD  CRANBERRY PO Take 1 tablet by mouth daily.    Historical Provider, MD  cyclobenzaprine (FLEXERIL) 10 MG tablet Take 1 tablet (10 mg total) by mouth 3 (three) times daily as needed for muscle spasms. 09/20/15   Nita Sells, MD  ergocalciferol (VITAMIN D2) 50000 units capsule Take 50,000 Units by mouth once a week.    Historical Provider, MD  Ferrous Sulfate (IRON) 28 MG TABS Take 1 tablet by mouth daily as needed (monthly bleeding).     Historical Provider, MD  ibuprofen (ADVIL,MOTRIN) 200 MG tablet Take 2 tablets (400 mg total)  by mouth every 6 (six) hours as needed for headache or moderate pain. 09/05/15   Harvel Quale, MD  meclizine (ANTIVERT) 25 MG tablet Take 25 mg by mouth 4 (four) times daily as needed for dizziness.    Historical Provider, MD  meloxicam (MOBIC) 15 MG tablet Take 7.5 mg by mouth daily as needed for pain.     Historical Provider, MD  methocarbamol (ROBAXIN) 500 MG tablet Take 1 tablet (500 mg total) by mouth 2 (two) times daily as needed for muscle spasms. 03/22/16   Antonietta Breach, PA-C  naproxen (NAPROSYN) 500 MG tablet Take 1 tablet (500 mg total) by mouth 2 (two) times daily. 03/22/16   Antonietta Breach, PA-C  nitrofurantoin, macrocrystal-monohydrate, (MACROBID) 100 MG capsule Take 1 capsule (100 mg total) by mouth every 12 (twelve) hours. Patient not taking: Reported on 01/09/2016 09/20/15   Nita Sells, MD  nitroGLYCERIN (NITROSTAT) 0.4 MG SL tablet Place 0.4 mg under the tongue every 5 (five) minutes as needed for chest pain.     Historical Provider, MD  oxyCODONE-acetaminophen (PERCOCET/ROXICET) 5-325 MG tablet Take 1-2 tablets by mouth every 6 (six) hours as needed for severe pain. 03/22/16   Antonietta Breach, PA-C  polyethylene glycol (MIRALAX / GLYCOLAX) packet Take 17 g by mouth daily. Patient taking differently: Take 17 g by mouth daily as needed for moderate constipation.  09/20/15   Nita Sells, MD  Probiotic Product (PROBIOTIC PO) Take 1 capsule by mouth daily.    Historical Provider, MD  promethazine (PHENERGAN) 25 MG tablet Take 25 mg by mouth every 6 (six) hours as needed for nausea or vomiting.    Historical Provider, MD  traMADol (ULTRAM) 50 MG tablet Take 1 tablet (50 mg total) by mouth every 6 (six) hours as needed. 09/20/15   Nita Sells, MD    Family History Family History  Problem Relation Age of Onset  . Hypertension Mother   . Diabetes Mother   . Stroke Mother   . Heart disease Mother   . Hypertension Father   . Diabetes Father   . Dementia Father   .  Schizophrenia Father     Social History Social History  Substance Use Topics  . Smoking status: Never Smoker  . Smokeless tobacco: Never Used  . Alcohol use No     Allergies   Sulfonamide derivatives; Ciprofloxacin; Dilaudid [hydromorphone hcl]; Morphine and related; Norvasc [amlodipine]; and Amlodipine besylate   Review of Systems Review of Systems  Constitutional:       Per HPI, otherwise negative  HENT:       Per HPI, otherwise negative  Respiratory:       Per HPI, otherwise negative  Cardiovascular:       Per HPI, otherwise negative  Gastrointestinal: Positive for nausea. Negative for vomiting.  Endocrine:       Negative aside from HPI  Genitourinary:  Neg aside from HPI   Musculoskeletal:       Per HPI, otherwise negative  Skin: Negative.   Neurological: Positive for dizziness and headaches. Negative for syncope.     Physical Exam Updated Vital Signs BP 129/97 (BP Location: Right Arm)   Pulse 76   Temp 98.1 F (36.7 C) (Oral)   Resp 14   Ht 5' 3.5" (1.613 m)   Wt 205 lb (93 kg)   LMP 06/02/2016   SpO2 100%   BMI 35.74 kg/m   Physical Exam  Constitutional: She is oriented to person, place, and time. She appears well-developed and well-nourished.  Uncomfortable appearing female resting in dark room, trying not to move.  HENT:  Head: Normocephalic and atraumatic.  Eyes: Conjunctivae and EOM are normal.  Cardiovascular: Normal rate and regular rhythm.   Pulmonary/Chest: Effort normal and breath sounds normal. No stridor. No respiratory distress.  Abdominal: She exhibits no distension.  Musculoskeletal: She exhibits no edema.  Neurological: She is alert and oriented to person, place, and time. She displays no atrophy and no tremor. No cranial nerve deficit or sensory deficit. She exhibits normal muscle tone. She displays no seizure activity. Coordination normal.  With head rotation, eyes closed, and eyes open, patient has easily induced dizziness.   Neurologic exam otherwise unremarkable.   Skin: Skin is warm and dry.  Psychiatric: She has a normal mood and affect.  Nursing note and vitals reviewed.    ED Treatments / Results  Labs (all labs ordered are listed, but only abnormal results are displayed) Labs Reviewed  URINALYSIS, ROUTINE W REFLEX MICROSCOPIC (NOT AT [facility]) - Abnormal; Notable for the following:       Result Value   APPearance CLOUDY (*)    Hgb urine dipstick MODERATE (*)    All other components within normal limits  COMPREHENSIVE METABOLIC PANEL - Abnormal; Notable for the following:    Glucose, Bld 102 (*)    Calcium 8.4 (*)    All other components within normal limits  CBC WITH DIFFERENTIAL/PLATELET - Abnormal; Notable for the following:    WBC 3.6 (*)    Hemoglobin 10.8 (*)    HCT 33.8 (*)    Platelets 407 (*)    All other components within normal limits  URINE MICROSCOPIC-ADD ON - Abnormal; Notable for the following:    Squamous Epithelial / LPF 0-5 (*)    Bacteria, UA RARE (*)    All other components within normal limits  I-STAT BETA HCG BLOOD, ED ([facility], [facility], [facility] ONLY)     Radiology Ct Head Wo Contrast  Result Date: 06/09/2016 CLINICAL DATA:  40 year old female with a history of dizziness EXAM: CT HEAD WITHOUT CONTRAST TECHNIQUE: Contiguous axial images were obtained from the base of the skull through the vertex without intravenous contrast. COMPARISON:  CT 09/15/2015, MR 10/05/2013 FINDINGS: Unremarkable appearance of the calvarium without acute fracture or aggressive lesion. Unremarkable appearance of the scalp soft tissues. Unremarkable appearance of the bilateral orbits. Mastoid air cells are clear. No significant paranasal sinus disease No acute intracranial hemorrhage, midline shift, or mass effect. Gray-white differentiation is maintained, without CT evidence of acute ischemia. Unremarkable configuration of the ventricles. IMPRESSION: No CT evidence of acute intracranial abnormality. Signed, Dulcy Fanny.  Earleen Newport, DO [facility] Electronically Signed   By: Corrie Mckusick D.O.   On: 06/09/2016 11:37    Procedures Procedures (including critical care time)  Medications Ordered in ED Medications  sodium chloride 0.9 %  bolus 1,000 mL (0 mLs Intravenous Stopped 06/09/16 1109)  LORazepam (ATIVAN) injection 1 mg (1 mg Intravenous Given 06/09/16 0936)  ketorolac (TORADOL) 30 MG/ML injection 15 mg (15 mg Intravenous Given 06/09/16 0936)  meclizine (ANTIVERT) tablet 25 mg (25 mg Oral Given 06/09/16 1110)     Initial Impression / Assessment and Plan / ED Course  I have reviewed the triage vital signs and the nursing notes.  Pertinent labs & imaging results that were available during my care of the patient were reviewed by me and considered in my medical decision making (see chart for details).  Clinical Course    10:43 AM Headache has improved substantially, dizziness is persistent after initial fluids, Ativan for Patient will receive additional meclizine, CT pending.  12:47 PM Patient resting, seemingly comfortable.  We discussed all findings at length.  Patient will f/u w her specialists at [facility].   Final Clinical Impressions(s) / ED Diagnoses  Patient presents with concern of dizziness, headache. Patient has a history of migraines, as well as vertigo, as previously diagnosed vestibular dysfunction. Here, no evidence for new neurologic deficits, stroke, and the patient improved, though she had some mild persistent symptoms. With improvement, no red flags for new conditions, the patient was discharged in stable condition with scheduled medication, to follow-up with her specialists.    Carmin Muskrat, MD 06/09/16 1258

## 2016-06-09 NOTE — ED Triage Notes (Signed)
Pt reports intermittent h/a x 2 weeks,woke up with dizziness this am.  Has hx of vertigo.  Took meclizine which took the "spinning" sensation away.  Pt also reports nausea but denies any vomiting at this time.  Pt states at times her L arm is numb.  Also reports gen weakness.  Denies unilateral weakness.  No facial droop or slurred speech at this time.

## 2016-09-02 ENCOUNTER — Encounter (HOSPITAL_COMMUNITY): Payer: Self-pay

## 2016-09-02 ENCOUNTER — Emergency Department (HOSPITAL_COMMUNITY)
Admission: EM | Admit: 2016-09-02 | Discharge: 2016-09-02 | Disposition: A | Discharge status: Home / Self Care | Attending: Emergency Medicine | Admitting: Emergency Medicine

## 2016-09-02 ENCOUNTER — Emergency Department (HOSPITAL_COMMUNITY)

## 2016-09-02 DIAGNOSIS — M545 Low back pain: Secondary | ICD-10-CM | POA: Diagnosis present

## 2016-09-02 DIAGNOSIS — Z79899 Other long term (current) drug therapy: Secondary | ICD-10-CM | POA: Diagnosis not present

## 2016-09-02 DIAGNOSIS — M5441 Lumbago with sciatica, right side: Secondary | ICD-10-CM | POA: Insufficient documentation

## 2016-09-02 DIAGNOSIS — J45909 Unspecified asthma, uncomplicated: Secondary | ICD-10-CM | POA: Diagnosis not present

## 2016-09-02 DIAGNOSIS — I10 Essential (primary) hypertension: Secondary | ICD-10-CM | POA: Diagnosis not present

## 2016-09-02 LAB — POC URINE PREG, ED: Preg Test, Ur: NEGATIVE

## 2016-09-02 MED ORDER — TRAMADOL HCL 50 MG PO TABS: 50.0000 mg | ORAL_TABLET | Freq: Four times a day (QID) | ORAL | 0 refills | Status: DC | PRN

## 2016-09-02 MED ORDER — HYDROMORPHONE HCL 1 MG/ML IJ SOLN
1.0000 mg | Freq: Once | INTRAMUSCULAR | Status: AC
  Administered 2016-09-02: 1 mg via INTRAMUSCULAR
  Filled 2016-09-02: qty 1

## 2016-09-02 MED ORDER — ONDANSETRON HCL 8 MG PO TABS
8.0000 mg | ORAL_TABLET | Freq: Three times a day (TID) | ORAL | 0 refills | Status: DC | PRN
Start: 2016-09-02 — End: 2017-12-16

## 2016-09-02 MED ORDER — ONDANSETRON 8 MG PO TBDP
8.0000 mg | ORAL_TABLET | Freq: Once | ORAL | Status: AC
  Administered 2016-09-02: 8 mg via ORAL
  Filled 2016-09-02: qty 1

## 2016-09-02 MED ORDER — DIPHENHYDRAMINE HCL 25 MG PO CAPS
50.0000 mg | ORAL_CAPSULE | Freq: Once | ORAL | Status: AC
  Administered 2016-09-02: 50 mg via ORAL
  Filled 2016-09-02: qty 2

## 2016-09-02 MED ORDER — CYCLOBENZAPRINE HCL 10 MG PO TABS: 10.0000 mg | ORAL_TABLET | Freq: Two times a day (BID) | ORAL | 0 refills | Status: DC | PRN

## 2016-09-02 MED ORDER — KETOROLAC TROMETHAMINE 60 MG/2ML IM SOLN
60.0000 mg | Freq: Once | INTRAMUSCULAR | Status: AC
  Administered 2016-09-02: 60 mg via INTRAMUSCULAR
  Filled 2016-09-02: qty 2

## 2016-09-02 MED ORDER — PREDNISONE 10 MG PO TABS: 20.0000 mg | ORAL_TABLET | Freq: Every day | ORAL | 0 refills | Status: DC

## 2016-09-02 NOTE — Discharge Instructions (Signed)
X-ray showed no acute findings. Prescription for muscle relaxer, pain medicine, prednisone. You will need to get a primary care doctor in [place].

## 2016-09-02 NOTE — ED Provider Notes (Signed)
[facility] DEPT Provider Note   CSN: [id number] Arrival date & time: 09/02/16  0840     History   Chief Complaint Chief Complaint  Patient presents with  . Back Pain    HPI [name] is a 40 y.o. female.  Patient presents with right-sided low back pain with radiation to the buttocks and occasionally to the foot, especially toes 1,2, and 3. She has been seen at [facility] in the past and been injected in her lower back. No bowel or bladder incontinence. Low back pain has been chronic. She has had scoliosis. No medication at present time.      Past Medical History:  Diagnosis Date  . Asthma    As a child  . Degenerative disk disease   . Hypertension   . Migraine   . Mitral valve prolapse   . Ovarian cyst   . Palpitations   . Panic attacks   . Renal disorder   . Scoliosis   . UTI (lower urinary tract infection)   . Vertigo     Patient Active Problem List   Diagnosis Date Noted  . Pelvic pain in female 01/09/2016  . Panic attacks 09/15/2015  . Hypertension 09/15/2015  . Scoliosis 09/15/2015  . Renal disorder 09/15/2015  . Asthma 09/15/2015  . Sepsis due Pyelonephritis   09/15/2015  . Pyelonephritis 09/15/2015  . BV (bacterial vaginosis) 03/22/2014  . Unspecified symptom associated with female genital organs 03/08/2014  . Unspecified disorder of menstruation and other abnormal bleeding from female genital tract 03/08/2014  . Mitral valve disorder 07/17/2010  . PALPITATIONS 07/17/2010  . PRECORDIAL PAIN 07/17/2010  . OTH NONSPECIFIC ABNORM CV SYSTEM FUNCTION STUDY 07/17/2010    Past Surgical History:  Procedure Laterality Date  . BACK SURGERY    . CESAREAN SECTION    . CHOLECYSTECTOMY    . CYSTOSCOPY  01/09/2016   Procedure: CYSTOSCOPY;  Surgeon: Benjaman Kindler, MD;  Location: [facility] ORS;  Service: Gynecology;;  . ESOPHAGOGASTRODUODENOSCOPY ENDOSCOPY    . HYSTEROSCOPY W/D&C N/A 01/09/2016   Procedure: DILATATION AND CURETTAGE  /HYSTEROSCOPY/endometrial pap smear;  Surgeon: Benjaman Kindler, MD;  Location: [facility] ORS;  Service: Gynecology;  Laterality: N/A;  . KNEE SURGERY    . LAPAROSCOPY    . Radio Frequency Albiation     . TUBAL LIGATION      OB History    Gravida Para Term Preterm AB Living   3 3 2 1   3    SAB TAB Ectopic Multiple Live Births           3       Home Medications    Prior to Admission medications   Medication Sig Start Date End Date Taking? Authorizing Provider  acetaminophen-codeine (TYLENOL #3) 300-30 MG tablet Take 1 tablet by mouth daily as needed for moderate pain.  04/26/16   Historical Provider, MD  Ascorbic Acid (VITAMIN C) 1000 MG tablet Take 1,000 mg by mouth daily.    Historical Provider, MD  benzonatate (TESSALON PERLES) 100 MG capsule Take 1 capsule (100 mg total) by mouth 3 (three) times daily as needed for cough. Patient not taking: Reported on 06/09/2016 02/06/16   Shary Decamp, PA-C  CRANBERRY PO Take 1 tablet by mouth daily.    Historical Provider, MD  cyclobenzaprine (FLEXERIL) 10 MG tablet Take 1 tablet (10 mg total) by mouth 2 (two) times daily as needed for muscle spasms. 09/02/16   Nat Christen, MD  ibuprofen (ADVIL,MOTRIN) 200 MG tablet Take 2  tablets (400 mg total) by mouth every 6 (six) hours as needed for headache or moderate pain. Patient taking differently: Take 200 mg by mouth every 6 (six) hours as needed for headache or moderate pain.  09/05/15   Harvel Quale, MD  LORazepam (ATIVAN) 1 MG tablet Take 1 tablet (1 mg total) by mouth 3 (three) times daily as needed (HA, dizziness). 06/09/16   Carmin Muskrat, MD  meclizine (ANTIVERT) 25 MG tablet Take 1 tablet (25 mg total) by mouth 3 (three) times daily. 06/09/16   Carmin Muskrat, MD  meloxicam (MOBIC) 15 MG tablet Take 7.5 mg by mouth daily as needed for pain.     Historical Provider, MD  methocarbamol (ROBAXIN) 500 MG tablet Take 1 tablet (500 mg total) by mouth 2 (two) times daily as needed for muscle spasms. 03/22/16    Antonietta Breach, PA-C  naproxen (NAPROSYN) 500 MG tablet Take 1 tablet (500 mg total) by mouth 2 (two) times daily. Patient taking differently: Take 500 mg by mouth daily as needed for mild pain.  03/22/16   Antonietta Breach, PA-C  nitroGLYCERIN (NITROSTAT) 0.4 MG SL tablet Place 0.4 mg under the tongue every 5 (five) minutes as needed for chest pain.     Historical Provider, MD  oxyCODONE-acetaminophen (PERCOCET/ROXICET) 5-325 MG tablet Take 1-2 tablets by mouth every 6 (six) hours as needed for severe pain. 03/22/16   Antonietta Breach, PA-C  predniSONE (DELTASONE) 10 MG tablet Take 2 tablets (20 mg total) by mouth daily. 09/02/16   Nat Christen, MD  Probiotic Product (PROBIOTIC PO) Take 1 capsule by mouth daily.    Historical Provider, MD  traMADol (ULTRAM) 50 MG tablet Take 1 tablet (50 mg total) by mouth every 6 (six) hours as needed. 09/02/16   Nat Christen, MD    Family History Family History  Problem Relation Age of Onset  . Hypertension Mother   . Diabetes Mother   . Stroke Mother   . Heart disease Mother   . Hypertension Father   . Diabetes Father   . Dementia Father   . Schizophrenia Father     Social History Social History  Substance Use Topics  . Smoking status: Never Smoker  . Smokeless tobacco: Never Used  . Alcohol use No     Allergies   Sulfonamide derivatives; Ciprofloxacin; Dilaudid [hydromorphone hcl]; Morphine and related; Norvasc [amlodipine]; and Amlodipine besylate   Review of Systems Review of Systems  All other systems reviewed and are negative.    Physical Exam Updated Vital Signs BP 138/85 (BP Location: Left Arm)   Pulse 83   Temp 97.7 F (36.5 C) (Oral)   Resp 16   LMP 08/01/2016   SpO2 100%   Physical Exam  Constitutional: She is oriented to person, place, and time. She appears well-developed and well-nourished.  HENT:  Head: Normocephalic and atraumatic.  Eyes: Conjunctivae are normal.  Neck: Neck supple.  Cardiovascular: Normal rate and regular  rhythm.   Pulmonary/Chest: Effort normal and breath sounds normal.  Abdominal: Soft. Bowel sounds are normal.  Musculoskeletal:  Minimal right lower back tenderness with tenderness in the distribution of the sciatic nerve. Minimal pain with straight leg raise.  Neurological: She is alert and oriented to person, place, and time.  Skin: Skin is warm and dry.  Psychiatric: She has a normal mood and affect. Her behavior is normal.  Nursing note and vitals reviewed.    ED Treatments / Results  Labs (all labs ordered are listed, but only  abnormal results are displayed) Labs Reviewed  POC URINE PREG, ED    EKG  EKG Interpretation None       Radiology Dg Lumbar Spine Complete  Result Date: 09/02/2016 CLINICAL DATA:  Low back pain for 2 months EXAM: LUMBAR SPINE - COMPLETE 4+ VIEW COMPARISON:  03/22/2016 FINDINGS: Mild levoscoliosis at L1-2 is stable. No vertebral compression deformity. Mild narrowing of the L5-S1 disc. No definite fracture. IMPRESSION: No acute bony pathology.  Mild chronic changes are stable. Electronically Signed   By: Marybelle Killings M.D.   On: 09/02/2016 11:05    Procedures Procedures (including critical care time)  Medications Ordered in ED Medications  HYDROmorphone (DILAUDID) injection 1 mg (not administered)  diphenhydrAMINE (BENADRYL) capsule 50 mg (not administered)  ketorolac (TORADOL) injection 60 mg (60 mg Intramuscular Given 09/02/16 0955)     Initial Impression / Assessment and Plan / ED Course  I have reviewed the triage vital signs and the nursing notes.  Pertinent labs & imaging results that were available during my care of the patient were reviewed by me and considered in my medical decision making (see chart for details).  Clinical Course    Patient has acute and chronic problem with her lower back. Plain films of the LS spine show no acute bony pathology. There is mild scoliosis at L1-2.  Patient has been encouraged to get primary care  follow-up in her new home. Discharge medication Flexeril 10 mg, prednisone, Ultram.  Final Clinical Impressions(s) / ED Diagnoses   Final diagnoses:  Right-sided low back pain with right-sided sciatica, unspecified chronicity    New Prescriptions New Prescriptions   CYCLOBENZAPRINE (FLEXERIL) 10 MG TABLET    Take 1 tablet (10 mg total) by mouth 2 (two) times daily as needed for muscle spasms.   PREDNISONE (DELTASONE) 10 MG TABLET    Take 2 tablets (20 mg total) by mouth daily.   TRAMADOL (ULTRAM) 50 MG TABLET    Take 1 tablet (50 mg total) by mouth every 6 (six) hours as needed.     Nat Christen, MD 09/02/16 1228

## 2016-09-02 NOTE — ED Triage Notes (Signed)
Pt c/o chronic low back pain w/ sciatica r/t scoliosis x years increasing x "a couple months."  Pain score 7/10.  Pt has not taken anything for pain.  Sts "I haven't taken anything, because it just masks and doesn't actually do anything."  Pt reports that she was being seen by [facility] but sts "I chose to stop going, because she told me that there wasn't anything wrong w/ my back."

## 2017-07-25 DIAGNOSIS — J45909 Unspecified asthma, uncomplicated: Secondary | ICD-10-CM | POA: Insufficient documentation

## 2017-07-25 DIAGNOSIS — Y92009 Unspecified place in unspecified non-institutional (private) residence as the place of occurrence of the external cause: Secondary | ICD-10-CM | POA: Insufficient documentation

## 2017-07-25 DIAGNOSIS — W108XXA Fall (on) (from) other stairs and steps, initial encounter: Secondary | ICD-10-CM | POA: Diagnosis not present

## 2017-07-25 DIAGNOSIS — Y999 Unspecified external cause status: Secondary | ICD-10-CM | POA: Insufficient documentation

## 2017-07-25 DIAGNOSIS — Z79899 Other long term (current) drug therapy: Secondary | ICD-10-CM | POA: Diagnosis not present

## 2017-07-25 DIAGNOSIS — S92201A Fracture of unspecified tarsal bone(s) of right foot, initial encounter for closed fracture: Secondary | ICD-10-CM | POA: Insufficient documentation

## 2017-07-25 DIAGNOSIS — S93401A Sprain of unspecified ligament of right ankle, initial encounter: Secondary | ICD-10-CM | POA: Insufficient documentation

## 2017-07-25 DIAGNOSIS — S99911A Unspecified injury of right ankle, initial encounter: Secondary | ICD-10-CM | POA: Diagnosis present

## 2017-07-25 DIAGNOSIS — Y9301 Activity, walking, marching and hiking: Secondary | ICD-10-CM | POA: Diagnosis not present

## 2017-07-25 DIAGNOSIS — I10 Essential (primary) hypertension: Secondary | ICD-10-CM | POA: Diagnosis not present

## 2017-07-26 ENCOUNTER — Emergency Department (HOSPITAL_COMMUNITY)
Admission: EM | Admit: 2017-07-26 | Discharge: 2017-07-26 | Disposition: A | Discharge status: Home / Self Care | Attending: Emergency Medicine | Admitting: Emergency Medicine

## 2017-07-26 ENCOUNTER — Emergency Department (HOSPITAL_COMMUNITY)

## 2017-07-26 ENCOUNTER — Encounter (HOSPITAL_COMMUNITY): Payer: Self-pay

## 2017-07-26 DIAGNOSIS — W19XXXA Unspecified fall, initial encounter: Secondary | ICD-10-CM

## 2017-07-26 DIAGNOSIS — S92201A Fracture of unspecified tarsal bone(s) of right foot, initial encounter for closed fracture: Secondary | ICD-10-CM

## 2017-07-26 DIAGNOSIS — S93401A Sprain of unspecified ligament of right ankle, initial encounter: Secondary | ICD-10-CM

## 2017-07-26 LAB — I-STAT TROPONIN, ED: Troponin i, poc: 0.06 ng/mL (ref 0.00–0.08)

## 2017-07-26 MED ORDER — TRAMADOL HCL 50 MG PO TABS: 50.0000 mg | ORAL_TABLET | Freq: Four times a day (QID) | ORAL | 0 refills | Status: DC | PRN

## 2017-07-26 MED ORDER — OXYCODONE-ACETAMINOPHEN 5-325 MG PO TABS
1.0000 | ORAL_TABLET | ORAL | Status: DC | PRN
  Administered 2017-07-26: 1 via ORAL
  Filled 2017-07-26: qty 1

## 2017-07-26 MED ORDER — ONDANSETRON 4 MG PO TBDP
4.0000 mg | ORAL_TABLET | Freq: Once | ORAL | Status: AC
  Administered 2017-07-26: 4 mg via ORAL
  Filled 2017-07-26: qty 1

## 2017-07-26 MED ORDER — KETOROLAC TROMETHAMINE 60 MG/2ML IM SOLN
60.0000 mg | Freq: Once | INTRAMUSCULAR | Status: AC
  Administered 2017-07-26: 60 mg via INTRAMUSCULAR
  Filled 2017-07-26: qty 2

## 2017-07-26 MED ORDER — DIPHENHYDRAMINE HCL 25 MG PO CAPS
25.0000 mg | ORAL_CAPSULE | Freq: Once | ORAL | Status: AC
  Administered 2017-07-26: 25 mg via ORAL
  Filled 2017-07-26: qty 1

## 2017-07-26 MED ORDER — IBUPROFEN 600 MG PO TABS: 600.0000 mg | ORAL_TABLET | Freq: Four times a day (QID) | ORAL | 0 refills | Status: DC | PRN

## 2017-07-26 NOTE — ED Triage Notes (Signed)
Patient states she slipped and fell down 2 steps this am and injured her left ankle and foot-states that she is unable to bear weight right foot and has swelling to top of foot and states pain radiates to lower extremity

## 2017-07-26 NOTE — Discharge Instructions (Signed)
Your x-ray showed small fractures to your talus, navicular bone, calcaneus, and cuboid. These small fractures likely correspond to injuries of your peroneal tendons and anterior talofibular ligaments. This is consistent with a high-grade ankle sprain. You have been placed in a CAM walker (boot) for stability. Use crutches as needed when walking if this causes the pain. Use a CAM walker at all times for stability, though you may remove this boot when showering. Continue with icing of your ankle 3-4 times per day for 15-20 minutes each time. We also advised elevation. Follow-up with an orthopedist to ensure resolution of symptoms.

## 2017-07-26 NOTE — ED Provider Notes (Signed)
[facility] DEPT Provider Note   CSN: [id number] Arrival date & time: 07/25/17  2350     History   Chief Complaint Chief Complaint  Patient presents with  . Fall  . Ankle Pain  . Foot Pain    HPI [place] is a 41 y.o. female.  41 year old female with a history of asthma, hypertension, and mitral valve prolapse presents to the emergency department for evaluation of right ankle pain. Patient states that she was walking down her steps when she missed a step falling down the rest of the stairs. She twisted her ankle in the process and has been having pain since the fall. She reports associated swelling. Pain is worse with weightbearing, causing patient inability to ambulate. No medications taken prior to arrival for symptoms. No numbness or weakness. Patient denies a history of right ankle fracture. She has previously been followed by an orthopedist in [place], [place].      Past Medical History:  Diagnosis Date  . Asthma    As a child  . Degenerative disk disease   . Hypertension   . Migraine   . Mitral valve prolapse   . Ovarian cyst   . Palpitations   . Panic attacks   . Renal disorder   . Scoliosis   . UTI (lower urinary tract infection)   . Vertigo     Patient Active Problem List   Diagnosis Date Noted  . Pelvic pain in female 01/09/2016  . Panic attacks 09/15/2015  . Hypertension 09/15/2015  . Scoliosis 09/15/2015  . Renal disorder 09/15/2015  . Asthma 09/15/2015  . Sepsis due Pyelonephritis   09/15/2015  . Pyelonephritis 09/15/2015  . BV (bacterial vaginosis) 03/22/2014  . Unspecified symptom associated with female genital organs 03/08/2014  . Unspecified disorder of menstruation and other abnormal bleeding from female genital tract 03/08/2014  . Mitral valve disorder 07/17/2010  . PALPITATIONS 07/17/2010  . PRECORDIAL PAIN 07/17/2010  . OTH NONSPECIFIC ABNORM CV SYSTEM FUNCTION STUDY 07/17/2010    Past Surgical History:  Procedure  Laterality Date  . BACK SURGERY    . CESAREAN SECTION    . CHOLECYSTECTOMY    . CYSTOSCOPY  01/09/2016   Procedure: CYSTOSCOPY;  Surgeon: Benjaman Kindler, MD;  Location: [facility] ORS;  Service: Gynecology;;  . ESOPHAGOGASTRODUODENOSCOPY ENDOSCOPY    . HYSTEROSCOPY W/D&C N/A 01/09/2016   Procedure: DILATATION AND CURETTAGE /HYSTEROSCOPY/endometrial pap smear;  Surgeon: Benjaman Kindler, MD;  Location: [facility] ORS;  Service: Gynecology;  Laterality: N/A;  . KNEE SURGERY    . LAPAROSCOPY    . Radio Frequency Albiation     . TUBAL LIGATION      OB History    Gravida Para Term Preterm AB Living   3 3 2 1   3    SAB TAB Ectopic Multiple Live Births           3       Home Medications    Prior to Admission medications   Medication Sig Start Date End Date Taking? Authorizing Provider  Ascorbic Acid (VITAMIN C) 1000 MG tablet Take 1,000 mg by mouth daily.   Yes [provider]  Capsicum, Cayenne, (CAYENNE PO) Take 1 capsule by mouth daily.   Yes [provider]  ECHINACEA PO Take 1 tablet by mouth daily.   Yes [provider]  GARCINIA CAMBOGIA-CHROMIUM PO Take 2 tablets by mouth daily.   Yes [provider]  Ginger, Zingiber officinalis, (GINGER ROOT PO) Take 1 tablet by  mouth daily.   Yes [provider]  meclizine (ANTIVERT) 25 MG tablet Take 1 tablet (25 mg total) by mouth 3 (three) times daily. Patient taking differently: Take 25 mg by mouth 3 (three) times daily as needed for dizziness or nausea.  06/09/16  Yes Carmin Muskrat, MD  traMADol (ULTRAM) 50 MG tablet Take 1 tablet (50 mg total) by mouth every 6 (six) hours as needed. Patient taking differently: Take 50 mg by mouth every 6 (six) hours as needed for moderate pain.  09/02/16  Yes Nat Christen, MD  TURMERIC PO Take 1 capsule by mouth daily.   Yes [provider]  Vitamin D, Cholecalciferol, 1000 units TABS Take 1,000 Units by mouth daily.    Yes [provider]  benzonatate  (TESSALON PERLES) 100 MG capsule Take 1 capsule (100 mg total) by mouth 3 (three) times daily as needed for cough. Patient not taking: Reported on 06/09/2016 02/06/16   Shary Decamp, PA-C  cyclobenzaprine (FLEXERIL) 10 MG tablet Take 1 tablet (10 mg total) by mouth 2 (two) times daily as needed for muscle spasms. Patient not taking: Reported on 07/26/2017 09/02/16   Nat Christen, MD  ibuprofen (ADVIL,MOTRIN) 200 MG tablet Take 2 tablets (400 mg total) by mouth every 6 (six) hours as needed for headache or moderate pain. Patient not taking: Reported on 07/26/2017 09/05/15   Harvel Quale, MD  LORazepam (ATIVAN) 1 MG tablet Take 1 tablet (1 mg total) by mouth 3 (three) times daily as needed (HA, dizziness). Patient not taking: Reported on 09/02/2016 06/09/16   Carmin Muskrat, MD  methocarbamol (ROBAXIN) 500 MG tablet Take 1 tablet (500 mg total) by mouth 2 (two) times daily as needed for muscle spasms. Patient not taking: Reported on 07/26/2017 03/22/16   Antonietta Breach, PA-C  naproxen (NAPROSYN) 500 MG tablet Take 1 tablet (500 mg total) by mouth 2 (two) times daily. Patient not taking: Reported on 09/02/2016 03/22/16   Antonietta Breach, PA-C  nitroGLYCERIN (NITROSTAT) 0.4 MG SL tablet Place 0.4 mg under the tongue every 5 (five) minutes as needed for chest pain.     [provider]  ondansetron (ZOFRAN) 8 MG tablet Take 1 tablet (8 mg total) by mouth every 8 (eight) hours as needed for nausea or vomiting. Patient not taking: Reported on 07/26/2017 09/02/16   Nat Christen, MD  oxyCODONE-acetaminophen (PERCOCET/ROXICET) 5-325 MG tablet Take 1-2 tablets by mouth every 6 (six) hours as needed for severe pain. Patient not taking: Reported on 09/02/2016 03/22/16   Antonietta Breach, PA-C  predniSONE (DELTASONE) 10 MG tablet Take 2 tablets (20 mg total) by mouth daily. Patient not taking: Reported on 07/26/2017 09/02/16   Nat Christen, MD    Family History Family History  Problem Relation Age of Onset  .  Hypertension Mother   . Diabetes Mother   . Stroke Mother   . Heart disease Mother   . Hypertension Father   . Diabetes Father   . Dementia Father   . Schizophrenia Father     Social History Social History  Substance Use Topics  . Smoking status: Never Smoker  . Smokeless tobacco: Never Used  . Alcohol use No     Allergies   Sulfonamide derivatives; Ciprofloxacin; Dilaudid [hydromorphone hcl]; Morphine and related; Norvasc [amlodipine]; and Amlodipine besylate   Review of Systems Review of Systems Ten systems reviewed and are negative for acute change, except as noted in the HPI.    Physical Exam Updated Vital Signs BP (!) 144/90 (  BP Location: Right Arm)   Pulse 69   Temp 97.8 F (36.6 C) (Oral)   Resp 15   Ht 5' 3.5" (1.613 m)   Wt 93 kg (205 lb)   LMP 06/25/2017 (Approximate)   SpO2 100%   BMI 35.74 kg/m   Physical Exam  Constitutional: She is oriented to person, place, and time. She appears well-developed and well-nourished. No distress.  Nontoxic and in NAD  HENT:  Head: Normocephalic and atraumatic.  Eyes: Conjunctivae and EOM are normal. No scleral icterus.  Neck: Normal range of motion.  Cardiovascular: Normal rate, regular rhythm and intact distal pulses.   DP and PT pulse 2+ in the RLE. Capillary refill brisk in all digits.  Pulmonary/Chest: Effort normal. No respiratory distress.  Respirations even and unlabored  Musculoskeletal:       Right ankle: She exhibits swelling. She exhibits no deformity and no laceration. Tenderness. Achilles tendon normal.       Feet:  Neurological: She is alert and oriented to person, place, and time. She exhibits normal muscle tone. Coordination normal.  Skin: Skin is warm and dry. No rash noted. She is not diaphoretic. No erythema. No pallor.  Psychiatric: She has a normal mood and affect. Her behavior is normal.  Nursing note and vitals reviewed.    ED Treatments / Results  Labs (all labs ordered are listed,  but only abnormal results are displayed) Labs Reviewed - No data to display  EKG  EKG Interpretation None       Radiology Dg Ankle Complete Right  Result Date: 07/26/2017 CLINICAL DATA:  [place] down steps, landing on the right side. Right lateral ankle and lateral foot pain. Unable to bear weight. EXAM: RIGHT ANKLE - COMPLETE 3+ VIEW; RIGHT FOOT COMPLETE - 3+ VIEW COMPARISON:  None. FINDINGS: Right ankle demonstrates no evidence of acute fracture or dislocation. Ankle mortise appears intact. Soft tissues are unremarkable. Right foot demonstrates acute appearing avulsion fractures off of the distal anterior talus, the anterior navicular bone, and the lateral calcaneus and cuboidal bone. There is overlying anterior and lateral soft tissue swelling over the tarsal bones. Findings suggest avulsion fractures likely arising from peroneal tendon injury and anterior talofibular ligament injury. No other fractures or focal bone lesions identified. IMPRESSION: Avulsion fractures demonstrated off of the distal anterior talus, the anterior navicular bone, and the lateral calcaneus in cuboidal bone. Changes consistent with acute avulsion fractures likely arising from the peroneal tendons and anterior talofibular ligaments. Electronically Signed   By: Lucienne Capers M.D.   On: 07/26/2017 02:07   Dg Foot Complete Right  Result Date: 07/26/2017 CLINICAL DATA:  [place] down steps, landing on the right side. Right lateral ankle and lateral foot pain. Unable to bear weight. EXAM: RIGHT ANKLE - COMPLETE 3+ VIEW; RIGHT FOOT COMPLETE - 3+ VIEW COMPARISON:  None. FINDINGS: Right ankle demonstrates no evidence of acute fracture or dislocation. Ankle mortise appears intact. Soft tissues are unremarkable. Right foot demonstrates acute appearing avulsion fractures off of the distal anterior talus, the anterior navicular bone, and the lateral calcaneus and cuboidal bone. There is overlying anterior and lateral soft tissue  swelling over the tarsal bones. Findings suggest avulsion fractures likely arising from peroneal tendon injury and anterior talofibular ligament injury. No other fractures or focal bone lesions identified. IMPRESSION: Avulsion fractures demonstrated off of the distal anterior talus, the anterior navicular bone, and the lateral calcaneus in cuboidal bone. Changes consistent with acute avulsion fractures likely arising from the peroneal tendons  and anterior talofibular ligaments. Electronically Signed   By: Lucienne Capers M.D.   On: 07/26/2017 02:07    Procedures Procedures (including critical care time)  Medications Ordered in ED Medications  oxyCODONE-acetaminophen (PERCOCET/ROXICET) 5-325 MG per tablet 1 tablet (1 tablet Oral Given 07/26/17 0122)  diphenhydrAMINE (BENADRYL) capsule 25 mg (not administered)  ketorolac (TORADOL) injection 60 mg (not administered)  ondansetron (ZOFRAN-ODT) disintegrating tablet 4 mg (4 mg Oral Given 07/26/17 0122)     Initial Impression / Assessment and Plan / ED Course  I have reviewed the triage vital signs and the nursing notes.  Pertinent labs & imaging results that were available during my care of the patient were reviewed by me and considered in my medical decision making (see chart for details).     40 year old female presents to the emergency department after a fall down steps this evening. She has right ankle swelling and pain which corresponds to 4 avulsion fractures to her tarsal bones. This is suspected to be arising from the peroneal tendons and anterior talo fibular ligaments. She is neurovascularly intact. X-ray reviewed by me which does not show any significant fracture to prevent weightbearing. Case discussed with Dr. Erlinda Hong including avulsion fractures of the talus, navicular, calcaneal, and cuboid bones as well as concern for injuries to the perineal tendons and anterior talofibular ligaments. Dr. Erlinda Hong recommends CAM walker and outpatient follow up.  RICE and NSAIDs advised with orthopedic follow up. Return precautions discussed and provided. Patient discharged in stable condition with no unaddressed concerns.   Final Clinical Impressions(s) / ED Diagnoses   Final diagnoses:  Severe sprain of right ankle, initial encounter  Closed nondisplaced fracture of tarsal bone of right foot, unspecified tarsal bone, initial encounter    New Prescriptions New Prescriptions   No medications on file     Antonietta Breach, Hershal Coria 07/26/17 0645    Palumbo, April, MD 07/26/17 7628

## 2017-12-16 ENCOUNTER — Other Ambulatory Visit: Payer: Self-pay

## 2017-12-16 ENCOUNTER — Encounter (HOSPITAL_COMMUNITY): Payer: Self-pay | Admitting: Emergency Medicine

## 2017-12-16 ENCOUNTER — Emergency Department (HOSPITAL_COMMUNITY)

## 2017-12-16 ENCOUNTER — Emergency Department (HOSPITAL_COMMUNITY)
Admission: EM | Admit: 2017-12-16 | Discharge: 2017-12-16 | Disposition: A | Discharge status: Home / Self Care | Attending: Emergency Medicine | Admitting: Emergency Medicine

## 2017-12-16 DIAGNOSIS — I10 Essential (primary) hypertension: Secondary | ICD-10-CM | POA: Insufficient documentation

## 2017-12-16 DIAGNOSIS — Z9049 Acquired absence of other specified parts of digestive tract: Secondary | ICD-10-CM | POA: Diagnosis not present

## 2017-12-16 DIAGNOSIS — R0789 Other chest pain: Secondary | ICD-10-CM | POA: Diagnosis not present

## 2017-12-16 DIAGNOSIS — R05 Cough: Secondary | ICD-10-CM | POA: Diagnosis present

## 2017-12-16 DIAGNOSIS — Z79899 Other long term (current) drug therapy: Secondary | ICD-10-CM | POA: Diagnosis not present

## 2017-12-16 DIAGNOSIS — J019 Acute sinusitis, unspecified: Secondary | ICD-10-CM | POA: Insufficient documentation

## 2017-12-16 DIAGNOSIS — J329 Chronic sinusitis, unspecified: Secondary | ICD-10-CM

## 2017-12-16 DIAGNOSIS — Z91040 Latex allergy status: Secondary | ICD-10-CM | POA: Diagnosis not present

## 2017-12-16 DIAGNOSIS — M419 Scoliosis, unspecified: Secondary | ICD-10-CM | POA: Insufficient documentation

## 2017-12-16 DIAGNOSIS — J45909 Unspecified asthma, uncomplicated: Secondary | ICD-10-CM | POA: Diagnosis not present

## 2017-12-16 DIAGNOSIS — B9789 Other viral agents as the cause of diseases classified elsewhere: Secondary | ICD-10-CM

## 2017-12-16 DIAGNOSIS — J069 Acute upper respiratory infection, unspecified: Secondary | ICD-10-CM

## 2017-12-16 LAB — BASIC METABOLIC PANEL
Anion gap: 12 (ref 5–15)
BUN: 11 mg/dL (ref 6–20)
CHLORIDE: 104 mmol/L (ref 101–111)
CO2: 25 mmol/L (ref 22–32)
Calcium: 9.4 mg/dL (ref 8.9–10.3)
Creatinine, Ser: 0.66 mg/dL (ref 0.44–1.00)
Glucose, Bld: 97 mg/dL (ref 65–99)
POTASSIUM: 3.5 mmol/L (ref 3.5–5.1)
SODIUM: 141 mmol/L (ref 135–145)

## 2017-12-16 LAB — CBC WITH DIFFERENTIAL/PLATELET
BASOS ABS: 0.1 10*3/uL (ref 0.0–0.1)
Basophils Relative: 1 %
Eosinophils Absolute: 0.2 10*3/uL (ref 0.0–0.7)
Eosinophils Relative: 5 %
HCT: 40 % (ref 36.0–46.0)
HEMOGLOBIN: 13 g/dL (ref 12.0–15.0)
LYMPHS ABS: 1.5 10*3/uL (ref 0.7–4.0)
Lymphocytes Relative: 34 %
MCH: 27.9 pg (ref 26.0–34.0)
MCHC: 32.5 g/dL (ref 30.0–36.0)
MCV: 85.8 fL (ref 78.0–100.0)
Monocytes Absolute: 0.5 10*3/uL (ref 0.1–1.0)
Monocytes Relative: 11 %
NEUTROS PCT: 49 %
Neutro Abs: 2.2 10*3/uL (ref 1.7–7.7)
PLATELETS: 430 10*3/uL — AB (ref 150–400)
RBC: 4.66 MIL/uL (ref 3.87–5.11)
RDW: 14.6 % (ref 11.5–15.5)
WBC: 4.5 10*3/uL (ref 4.0–10.5)

## 2017-12-16 LAB — I-STAT BETA HCG BLOOD, ED (MC, WL, AP ONLY)

## 2017-12-16 LAB — I-STAT TROPONIN, ED
TROPONIN I, POC: 0 ng/mL (ref 0.00–0.08)
Troponin i, poc: 0 ng/mL (ref 0.00–0.08)

## 2017-12-16 MED ORDER — BENZONATATE 100 MG PO CAPS
200.0000 mg | ORAL_CAPSULE | Freq: Once | ORAL | Status: AC
  Administered 2017-12-16: 200 mg via ORAL
  Filled 2017-12-16: qty 2

## 2017-12-16 MED ORDER — IPRATROPIUM-ALBUTEROL 0.5-2.5 (3) MG/3ML IN SOLN
3.0000 mL | Freq: Once | RESPIRATORY_TRACT | Status: AC
  Administered 2017-12-16: 3 mL via RESPIRATORY_TRACT
  Filled 2017-12-16: qty 3

## 2017-12-16 MED ORDER — GUAIFENESIN-CODEINE 100-10 MG/5ML PO SOLN: 5.0000 mL | Freq: Three times a day (TID) | ORAL | 0 refills | Status: DC | PRN

## 2017-12-16 MED ORDER — DOXYCYCLINE HYCLATE 100 MG PO CAPS: 100.0000 mg | ORAL_CAPSULE | Freq: Two times a day (BID) | ORAL | 0 refills | Status: DC

## 2017-12-16 MED ORDER — KETOROLAC TROMETHAMINE 30 MG/ML IJ SOLN
15.0000 mg | Freq: Once | INTRAMUSCULAR | Status: AC
  Administered 2017-12-16: 15 mg via INTRAMUSCULAR
  Filled 2017-12-16: qty 1

## 2017-12-16 MED ORDER — ONDANSETRON 4 MG PO TBDP
4.0000 mg | ORAL_TABLET | Freq: Once | ORAL | Status: AC
Start: 2017-12-16 — End: 2017-12-16
  Administered 2017-12-16: 4 mg via ORAL
  Filled 2017-12-16: qty 1

## 2017-12-16 MED ORDER — MELOXICAM 15 MG PO TABS: 15.0000 mg | ORAL_TABLET | Freq: Every day | ORAL | 1 refills | Status: DC

## 2017-12-16 MED ORDER — FLUTICASONE PROPIONATE 50 MCG/ACT NA SUSP: 2.0000 | Freq: Every day | NASAL | 12 refills | Status: DC

## 2017-12-16 MED ORDER — DEXAMETHASONE SODIUM PHOSPHATE 10 MG/ML IJ SOLN
10.0000 mg | Freq: Once | INTRAMUSCULAR | Status: AC
  Administered 2017-12-16: 10 mg
  Filled 2017-12-16: qty 1

## 2017-12-16 NOTE — ED Triage Notes (Addendum)
Patient complaining of deep cough, nasal congestion, no rest at night due to post nasal drainage, mouth is dry, and fever. Patient states she has had this for a week. Patient states she has taken several over counter medications and they are not working. Patient had tylenol cold and flu about 4 to 6 hours ago.

## 2017-12-16 NOTE — Discharge Instructions (Signed)
Your symptoms seem consistent with likely a viral illness.  May be the flu.  However given your length of your sinus congestion will start on doxycycline to help with sinusitis and chest congestion.  Have given you cough medicine to take this medication make you drowsy so do not drive with it.  Have given you meloxicam at your request.  Do not take this medication today as you have already had an NSAID today.  Plenty of fluids.  Use the Flonase for nasal congestion.  Perform nasal rinses to help rinse her sinuses.  Follow-up with your primary care doctor return to the ED with any worsening symptoms.  Your chest workup has been very reassuring today.  Unknown cause of your chest pain.  May be due to muscular skeletal pain.  Please follow-up with cardiology if symptoms not improving return to the ED with any worsening symptoms.

## 2017-12-16 NOTE — ED Provider Notes (Signed)
[facility] DEPT Provider Note   CSN: [id number] Arrival date & time: 12/16/17  1532     History   Chief Complaint Chief Complaint  Patient presents with  . Nasal Congestion  . Cough  . Fever    HPI [name] is a 42 y.o. female.  HPI 42 year old African-American female past medical history significant for asthma, DDD, hypertension, mitral valve prolapse, panic attacks, scoliosis that presents to the emergency department today with multiple complaints.  Initially patient complains of URI symptoms including productive cough, nasal congestion, sore throat, rhinorrhea, dry mouth and fever.  States that his symptoms symptoms have been ongoing for the past 8-9 days.  She been taking several over-the-counter medications including Tylenol Cold and flu with only little relief.  Reports sick contacts with the same.  States that her chest has been hurting due to coughing along with her back.  However patient also is forthcoming that prior to her symptoms started 1 week ago she was having intermittent episodes of palpitations for several days.  This has since resolved.  She also notes that she was developing some substernal chest pain with her palpitations.  States the pain was sharp in nature.  Radiated to her back and to her left arm.  States that she is to have this episode for the past year on and off.  Last episode of this was 1-1/2 weeks ago.  Episodes last for less than 10 seconds and then self resolved.  Not associated diaphoresis, nausea or emesis.  She denies any associated shortness of breath.  Patient states that she has a history of palpitations and mitral valve prolapse and was concerned about this.  She also reports being hypertensive in the ED and this also concerned her as well.  Patient does report being on over-the-counter decongestants.  Patient had stress test in 12/2016 that showed no inducible ischemia.  Also noted that patient had no evidence of  mitral valve prolapse or mitral regurgitation.  She was admitted in the hospital on 2/28 for chest pain rule out.  She had a mildly elevated troponin at that time that trended down.  Since she had the stress test that was unremarkable for any ischemic changes.  Patient was discharged with cardiac follow-up in outpatient setting and has not done so.  Patient denies any history of DVT/PE, prolonged immobilization, recent hospitalization/surgeries, tobacco use, OCP use or hormone use, hemoptysis.  Pt denies any ha, vision changes, lightheadedness, dizziness, congestion, neck pain, abd pain, n/v/d, urinary symptoms, change in bowel habits, melena, hematochezia, lower extremity paresthesias.   Past Medical History:  Diagnosis Date  . Asthma    As a child  . Degenerative disk disease   . Hypertension   . Migraine   . Mitral valve prolapse   . Ovarian cyst   . Palpitations   . Panic attacks   . Renal disorder   . Scoliosis   . UTI (lower urinary tract infection)   . Vertigo     Patient Active Problem List   Diagnosis Date Noted  . Pelvic pain in female 01/09/2016  . Panic attacks 09/15/2015  . Hypertension 09/15/2015  . Scoliosis 09/15/2015  . Renal disorder 09/15/2015  . Asthma 09/15/2015  . Sepsis due Pyelonephritis   09/15/2015  . Pyelonephritis 09/15/2015  . BV (bacterial vaginosis) 03/22/2014  . Unspecified symptom associated with female genital organs 03/08/2014  . Unspecified disorder of menstruation and other abnormal bleeding from female genital tract 03/08/2014  .  Mitral valve disorder 07/17/2010  . PALPITATIONS 07/17/2010  . PRECORDIAL PAIN 07/17/2010  . OTH NONSPECIFIC ABNORM CV SYSTEM FUNCTION STUDY 07/17/2010    Past Surgical History:  Procedure Laterality Date  . BACK SURGERY    . CESAREAN SECTION    . CHOLECYSTECTOMY    . CYSTOSCOPY  01/09/2016   Procedure: CYSTOSCOPY;  Surgeon: Benjaman Kindler, MD;  Location: [facility] ORS;  Service: Gynecology;;  .  ESOPHAGOGASTRODUODENOSCOPY ENDOSCOPY    . HYSTEROSCOPY W/D&C N/A 01/09/2016   Procedure: DILATATION AND CURETTAGE /HYSTEROSCOPY/endometrial pap smear;  Surgeon: Benjaman Kindler, MD;  Location: [facility] ORS;  Service: Gynecology;  Laterality: N/A;  . KNEE SURGERY    . LAPAROSCOPY    . Radio Frequency Albiation     . TUBAL LIGATION      OB History    Gravida Para Term Preterm AB Living   3 3 2 1   3    SAB TAB Ectopic Multiple Live Births           3       Home Medications    Prior to Admission medications   Medication Sig Start Date End Date Taking? Authorizing Provider  Ascorbic Acid (VITAMIN C) 1000 MG tablet Take 1,000 mg by mouth daily.   Yes [provider]  Capsicum, Cayenne, (CAYENNE PO) Take 1 capsule by mouth daily.   Yes [provider]  ECHINACEA PO Take 1 tablet by mouth daily.   Yes [provider]  GARCINIA CAMBOGIA-CHROMIUM PO Take 2 tablets by mouth daily.   Yes [provider]  Ginger, Zingiber officinalis, (GINGER ROOT PO) Take 1 tablet by mouth daily.   Yes [provider]  Lavender Oil OIL by Does not apply route.   Yes [provider]  meclizine (ANTIVERT) 25 MG tablet Take 1 tablet (25 mg total) by mouth 3 (three) times daily. Patient taking differently: Take 25 mg by mouth 3 (three) times daily as needed for dizziness or nausea.  06/09/16  Yes Carmin Muskrat, MD  meloxicam (MOBIC) 7.5 MG tablet Take 7.5 mg by mouth daily. 08/02/17  Yes [provider]  nitroGLYCERIN (NITROSTAT) 0.4 MG SL tablet Place 0.4 mg under the tongue every 5 (five) minutes as needed for chest pain.    Yes [provider]  TURMERIC PO Take 1 capsule by mouth daily.   Yes [provider]  Vitamin D, Cholecalciferol, 1000 units TABS Take 1,000 Units by mouth daily.    Yes [provider]  ibuprofen (ADVIL,MOTRIN) 600 MG tablet Take 1 tablet (600 mg total) by mouth every 6 (six) hours as needed. Patient not  taking: Reported on 12/16/2017 07/26/17   Antonietta Breach, PA-C  traMADol (ULTRAM) 50 MG tablet Take 1 tablet (50 mg total) by mouth every 6 (six) hours as needed for severe pain. Patient not taking: Reported on 12/16/2017 07/26/17   Antonietta Breach, PA-C  clonazePAM (KLONOPIN) 1 MG tablet Take 1 mg by mouth 4 (four) times daily.    01/09/12  [provider]  metoprolol tartrate (LOPRESSOR) 25 MG tablet Take 25 mg by mouth 2 (two) times daily.    01/09/12  [provider]    Family History Family History  Problem Relation Age of Onset  . Hypertension Mother   . Diabetes Mother   . Stroke Mother   . Heart disease Mother   . Hypertension Father   . Diabetes Father   . Dementia Father   . Schizophrenia Father  Social History Social History   Tobacco Use  . Smoking status: Never Smoker  . Smokeless tobacco: Never Used  Substance Use Topics  . Alcohol use: No  . Drug use: No     Allergies   Sulfonamide derivatives; Ciprofloxacin; Dilaudid [hydromorphone hcl]; Latex; Morphine and related; Mushroom extract complex; Norvasc [amlodipine]; Pineapple; and Amlodipine besylate   Review of Systems Review of Systems  Constitutional: Negative for chills, diaphoresis and fever.  HENT: Positive for congestion, postnasal drip, rhinorrhea, sinus pressure, sinus pain, sneezing and sore throat. Negative for ear pain.   Eyes: Negative for visual disturbance.  Respiratory: Positive for cough. Negative for shortness of breath and wheezing.   Cardiovascular: Positive for chest pain. Negative for palpitations and leg swelling.  Gastrointestinal: Negative for abdominal pain, diarrhea, nausea and vomiting.  Genitourinary: Negative for dysuria, flank pain, frequency, hematuria and urgency.  Musculoskeletal: Positive for myalgias. Negative for arthralgias.  Skin: Negative for rash.  Neurological: Positive for weakness. Negative for dizziness, syncope, light-headedness, numbness and headaches.    Psychiatric/Behavioral: Negative for sleep disturbance. The patient is not nervous/anxious.      Physical Exam Updated Vital Signs BP (!) 160/98 (BP Location: Left Arm)   Pulse 85   Temp 98.8 F (37.1 C) (Oral)   Resp 16   Ht 5' 3.5" (1.613 m)   Wt 95.8 kg (211 lb 1.6 oz)   LMP 12/11/2017   SpO2 100%   BMI 36.81 kg/m   Physical Exam  Constitutional: She is oriented to person, place, and time. She appears well-developed and well-nourished.  Non-toxic appearance. No distress.  HENT:  Head: Normocephalic and atraumatic.  Right Ear: Tympanic membrane, external ear and ear canal normal.  Left Ear: Tympanic membrane, external ear and ear canal normal.  Nose: Mucosal edema and rhinorrhea present. Right sinus exhibits maxillary sinus tenderness and frontal sinus tenderness. Left sinus exhibits maxillary sinus tenderness and frontal sinus tenderness.  Mouth/Throat: Uvula is midline and mucous membranes are normal. No trismus in the jaw. No uvula swelling. Posterior oropharyngeal erythema present. No oropharyngeal exudate, posterior oropharyngeal edema or tonsillar abscesses. No tonsillar exudate.  Eyes: Conjunctivae are normal. Pupils are equal, round, and reactive to light. Right eye exhibits no discharge. Left eye exhibits no discharge.  Neck: Normal range of motion. Neck supple. No JVD present. No tracheal deviation present.  Cardiovascular: Normal rate, regular rhythm, normal heart sounds and intact distal pulses. Exam reveals no gallop and no friction rub.  No murmur heard. Pulses are equal in all extremities.  Extremities are warm to touch.  Pulmonary/Chest: Effort normal and breath sounds normal. No stridor. No respiratory distress. She has no wheezes. She has no rales. She exhibits tenderness (anterior chest).  No hypoxia or tachypnea.  Abdominal: Soft. Bowel sounds are normal. She exhibits no distension. There is no tenderness. There is no rebound and no guarding.   Musculoskeletal: Normal range of motion.  No lower extremity edema or calf tenderness.  Lymphadenopathy:    She has no cervical adenopathy.  Neurological: She is alert and oriented to person, place, and time.  Skin: Skin is warm and dry. Capillary refill takes less than 2 seconds. She is not diaphoretic.  Psychiatric: Her behavior is normal. Judgment and thought content normal.  Nursing note and vitals reviewed.    ED Treatments / Results  Labs (all labs ordered are listed, but only abnormal results are displayed) Labs Reviewed  CBC WITH DIFFERENTIAL/PLATELET - Abnormal; Notable for the following components:  Result Value   Platelets 430 (*)    All other components within normal limits  BASIC METABOLIC PANEL  I-STAT TROPONIN, ED  I-STAT BETA HCG BLOOD, ED ([facility], [facility], [facility] ONLY)  I-STAT TROPONIN, ED    EKG  EKG Interpretation  Date/Time:  Monday December 16 2017 18:16:17 EST Ventricular Rate:  98 PR Interval:  150 QRS Duration: 98 QT Interval:  346 QTC Calculation: 441 R Axis:   53 Text Interpretation:  Normal sinus rhythm Nonspecific T wave abnormality Abnormal ECG Since last EKG, no significant change Persistent non-specific TW changes Confirmed by Duffy Bruce [phone]) on 12/16/2017 7:23:50 PM       Radiology Dg Chest 2 View  Result Date: 12/16/2017 CLINICAL DATA:  Cough, fever EXAM: CHEST  2 VIEW COMPARISON:  02/06/2016 chest radiograph. FINDINGS: Stable cardiomediastinal silhouette with normal heart size. No pneumothorax. No pleural effusion. Lungs appear clear, with no acute consolidative airspace disease and no pulmonary edema. Surgical clips are seen in the right upper quadrant of the abdomen. IMPRESSION: No active cardiopulmonary disease. Electronically Signed   By: Ilona Sorrel M.D.   On: 12/16/2017 17:22    Procedures Procedures (including critical care time)  Medications Ordered in ED Medications  ipratropium-albuterol (DUONEB) 0.5-2.5 (3) MG/3ML  nebulizer solution 3 mL (3 mLs Nebulization Given 12/16/17 1738)  dexamethasone (DECADRON) injection 10 mg (10 mg Other Given 12/16/17 1738)  benzonatate (TESSALON) capsule 200 mg (200 mg Oral Given 12/16/17 1738)  ketorolac (TORADOL) 30 MG/ML injection 15 mg (15 mg Intramuscular Given 12/16/17 1919)  ipratropium-albuterol (DUONEB) 0.5-2.5 (3) MG/3ML nebulizer solution 3 mL (3 mLs Nebulization Given 12/16/17 1922)  ondansetron (ZOFRAN-ODT) disintegrating tablet 4 mg (4 mg Oral Given 12/16/17 2039)     Initial Impression / Assessment and Plan / ED Course  I have reviewed the triage vital signs and the nursing notes.  Pertinent labs & imaging results that were available during my care of the patient were reviewed by me and considered in my medical decision making (see chart for details).    Patient presents to the ED with multiple complaints including chest pain, shortness of breath, influenza-like illness.  Chest pain is not likely of cardiac or pulmonary etiology d/t presentation, perc negative, VSS, no tracheal deviation, no JVD or new murmur, RRR, breath sounds equal bilaterally, EKG without any change from prior tracing and shows no signs of ischemia, heart pathway score 2, negative delta troponin, and negative CXR.  Patient reports history as a kid with mitral valve prolapse however she was admitted to the hospital last year for chest pain rule out.  At that time she had a negative stress test with no signs of mitral valve prolapse or mitral regurg.  Clinical presentation does not seem consistent with ACS, dissection, PE.  Pt has been advised to return to the ED is CP becomes exertional, associated with diaphoresis or nausea, radiates to left jaw/arm, worsens or becomes concerning in any way.   Patient also complains of influenza-like illness specifically she complains of 1 week of fever, chills, chest congestion, sinus pressure and congestion along with sore throat.  Patient reports intermittent  wheezing.  On exam lungs clear to auscultation bilaterally.  She does have a productive cough with anterior chest wall tenderness to palpation.  This is likely causing her chest pain.  No signs of pneumonia on the x-ray.  Patient's lab work is been reassuring in the ED.  No leukocytosis.  Given her 8-9 days of sinus pressure and congestion  with thick purulent discharge will start patient on antibiotics for sinusitis.  Discussed nasal rinses at home.  I discussed that her symptoms may be due to the flu however I would not test her today as this would not change my management in her symptoms.  She is out of the window for Tamiflu.  Encouraged plenty of fluids at home.  Pt is hemodynamically stable, in NAD, & able to ambulate in the ED. Evaluation does not show pathology that would require ongoing emergent intervention or inpatient treatment. I explained the diagnosis to the patient. Pain has been managed & has no complaints prior to dc. Pt is comfortable with above plan and is stable for discharge at this time. All questions were answered prior to disposition. Strict return precautions for f/u to the ED were discussed. Encouraged follow up with PCP.     Final Clinical Impressions(s) / ED Diagnoses   Final diagnoses:  Viral URI with cough  Sinusitis, unspecified chronicity, unspecified location  Other chest pain    ED Discharge Orders        Ordered    guaiFENesin-codeine 100-10 MG/5ML syrup  3 times daily PRN     12/16/17 2120    fluticasone (FLONASE) 50 MCG/ACT nasal spray  Daily     12/16/17 2120    doxycycline (VIBRAMYCIN) 100 MG capsule  2 times daily     12/16/17 2120    meloxicam (MOBIC) 15 MG tablet  Daily     12/16/17 2121       Aaron Edelman 12/16/17 2208    Duffy Bruce, MD 12/16/17 2354

## 2018-04-10 ENCOUNTER — Other Ambulatory Visit: Payer: Self-pay | Admitting: Obstetrics and Gynecology

## 2018-07-08 ENCOUNTER — Encounter
Admission: RE | Admit: 2018-07-08 | Discharge: 2018-07-08 | Disposition: A | Discharge status: Home / Self Care | Source: Ambulatory Visit | Attending: Obstetrics and Gynecology | Admitting: Obstetrics and Gynecology

## 2018-07-08 ENCOUNTER — Other Ambulatory Visit: Payer: Self-pay

## 2018-07-08 DIAGNOSIS — Z01812 Encounter for preprocedural laboratory examination: Secondary | ICD-10-CM | POA: Insufficient documentation

## 2018-07-08 HISTORY — DX: Cardiac arrhythmia, unspecified: I49.9

## 2018-07-08 HISTORY — DX: Unspecified convulsions: R56.9

## 2018-07-08 HISTORY — DX: Fibromyalgia: M79.7

## 2018-07-08 HISTORY — DX: Dyspnea, unspecified: R06.00

## 2018-07-08 LAB — CBC
HCT: 36.4 % (ref 35.0–47.0)
HEMOGLOBIN: 12.3 g/dL (ref 12.0–16.0)
MCH: 28.4 pg (ref 26.0–34.0)
MCHC: 33.7 g/dL (ref 32.0–36.0)
MCV: 84.1 fL (ref 80.0–100.0)
PLATELETS: 399 10*3/uL (ref 150–440)
RBC: 4.33 MIL/uL (ref 3.80–5.20)
RDW: 15.1 % — AB (ref 11.5–14.5)
WBC: 5 10*3/uL (ref 3.6–11.0)

## 2018-07-08 LAB — BASIC METABOLIC PANEL
ANION GAP: 6 (ref 5–15)
BUN: 11 mg/dL (ref 6–20)
CALCIUM: 8.7 mg/dL — AB (ref 8.9–10.3)
CO2: 29 mmol/L (ref 22–32)
CREATININE: 0.54 mg/dL (ref 0.44–1.00)
Chloride: 103 mmol/L (ref 98–111)
GFR calc Af Amer: >60 mL/min (ref >60)
GLUCOSE: 83 mg/dL (ref 70–99)
Potassium: 3.4 mmol/L — ABNORMAL LOW (ref 3.5–5.1)
Sodium: 138 mmol/L (ref 135–145)

## 2018-07-08 LAB — TYPE AND SCREEN
ABO/RH(D): O POS
Antibody Screen: NEGATIVE

## 2018-07-08 NOTE — H&P (Addendum)
Patient ID: [name] is a 42 y.o. female presenting with Pre Op Consulting  on 07/08/2018  HPI:  Workup: Chronic pelvic pain, planning for PFPT. Previously unable to get SIS, EMBx due to pain. Not sexually active due to pain. Pain appears to originate in uterus, and MSK upregulation now is dominant.   Ultrasound with ut 8.5x5x6cm  2 small fibroids Left ovarian cyst, complex 2cm cyst  Prior C/S x1 Prior lap BTL  Last pap smear 6/16 neg with +HPV  Pap: Last pap 2019 and in 2017 positive HPV, but negative for malignancy. Normal colpo in 2017.  EMBx: unable to get due to pain  Urine odor  Past Medical History:  has a past medical history of Asthma without status asthmaticus, unspecified, Depression, Dysmenorrhea, Endometrial polyp, Fibromyalgia, Gastritis (02/23/2016), Heart disease, HPV in female, Migraine headache, Migraine with vertigo, and Scoliosis.  Past Surgical History:  has a past surgical history that includes Tubal ligation (1998); Cholecystectomy; ovarian laperoscopy; Right knee arthroscopy with chondroplasty and alteral release as well as open distal tuberle osteotomy and medial patellofemoral ligament imbrication (05-28-12); Cholecystectomy; egd (02/23/2016); Dilation and curettage of uterus; Hysteroscopy; and polypectomy . Family History: family history includes Alcohol abuse in her father; Arthritis in her maternal aunt; Asthma in her mother; Breast cancer in her other; Coronary Artery Disease (Blocked arteries around heart) in her mother; Depression in her father and mother; Diabetes in her mother; Heart disease in her mother and other; High blood pressure (Hypertension) in her father and mother; Kidney disease in her other; Lung cancer in her other; Osteoarthritis in her mother; Schizophrenia in her father; Sickle cell anemia in her mother; Stroke in her father, mother, and other. Social History:  reports that she has never smoked. She has never used smokeless  tobacco. She reports that she does not drink alcohol or use drugs. OB/GYN History:  OB History    Gravida  0   Para  0   Term  0   Preterm  0   AB  0   Living  0     SAB  0   TAB  0   Ectopic  0   Molar  0   Multiple  0   Live Births  0          Allergies: is allergic to hydromorphone hcl; sulfabenzamide; gabapentin; ciprofloxacin; latex; norvasc [amlodipine]; pineapple; and sulfa (sulfonamide antibiotics). Medications:  Current Outpatient Medications:  .  ALPRAZolam (XANAX) 0.5 MG tablet, Take 1 tablet (0.5 mg total) by mouth 2 (two) times daily as needed (Anxiety), Disp: 4 tablet, Rfl: 0 .  amitriptyline (ELAVIL) 10 MG tablet, Take 1 tablet (10 mg total) by mouth nightly Increase 10mg  weekly until 80 mg max if needed, Disp: 90 tablet, Rfl: 3 .  ascorbic acid (VITAMIN C) 500 MG tablet, Take 500 mg by mouth once daily., Disp: , Rfl:  .  BIOTIN ORAL, Take by mouth., Disp: , Rfl:  .  bisacodyl (DULCOLAX) 5 mg EC tablet, Take 5 mg by mouth once daily as needed for Constipation., Disp: , Rfl:  .  butalbital-acetaminophen-caffeine (FIORICET) 50-325-40 mg tablet, TK 1 T PO Q 4 H, Disp: , Rfl: 0 .  clindamycin (CLEOCIN T) 1 % topical solution, Affected areas hands and feet (Patient not taking: Reported on 04/10/2018 ), Disp: 60 mL, Rfl: 2 .  cyclobenzaprine (FLEXERIL) 10 MG tablet, Take 10 mg by mouth., Disp: , Rfl:  .  ergocalciferol, vitamin D2, 50,000 unit capsule, TK  1 C PO 1 TIME A WEEK, Disp: , Rfl: 3 .  ferrous sulfate (IRON, FERROUS SULFATE,) 325 (65 FE) MG tablet, Take by mouth., Disp: , Rfl:  .  meclizine (ANTIVERT) 25 mg tablet, Take 1 tablet (25 mg total) by mouth 4 (four) times daily as needed for Dizziness., Disp: 30 tablet, Rfl: 3 .  meloxicam (MOBIC) 7.5 MG tablet, Take 1 tablet (7.5 mg total) by mouth once daily, Disp: 30 tablet, Rfl: 0 .  multivitamin with iron-minerals (VITAMINS AND MINERALS) tablet, Take by mouth. Reported on 11/30/2015 , Disp: , Rfl:   .  NAPROXEN ORAL, Take 1 tablet by mouth continuously as needed., Disp: , Rfl:  .  nitroGLYcerin (NITROSTAT) 0.4 MG SL tablet, Place under the tongue., Disp: , Rfl:  .  omega-3 fatty acids/fish oil (FISH OIL) 340-1,000 mg capsule, Take by mouth., Disp: , Rfl:  .  ondansetron (ZOFRAN) 4 MG tablet, Take 1 tablet (4 mg total) by mouth every 8 (eight) hours as needed for Nausea. (Patient not taking: Reported on 04/10/2018 ), Disp: 30 tablet, Rfl: 0 .  oxyCODONE-acetaminophen (PERCOCET) 5-325 mg tablet, Take 1 tablet by mouth every 6 (six) hours as needed for Pain, Disp: 4 tablet, Rfl: 0 .  polyethylene glycol (MIRALAX) packet, Take by mouth., Disp: , Rfl:    Review of Systems: No SOB, no palpitations or chest pain, no new lower extremity edema, no nausea or vomiting or bowel or bladder complaints. See HPI for gyn specific ROS.   Exam:   BP (!) 156/94   Ht 161.3 cm (5' 3.5")   Wt 96.7 kg (213 lb 3.2 oz)   BMI 37.17 kg/m   General: Patient is well-groomed, well-nourished, appears stated age in no acute distress  HEENT: head is atraumatic and normocephalic, trachea is midline, neck is supple with no palpable nodules  CV: Regular rhythm and normal heart rate, no murmur  Pulm: Clear to auscultation throughout lung fields with no wheezing, crackles, or rhonchi. No increased work of breathing  Abdomen: soft , no mass, non-tender, no rebound tenderness, no hepatomegaly  Pelvic: tanner stage 5 ,              External genitalia: vulva /labia no lesions             Urethra: no prolapse             Vagina: normal physiologic d/c, laxity in vaginal walls             Cervix: no lesions, no cervical motion tenderness, good descent             Uterus: normal size shape and contour, non-tender             Adnexa: no mass,  non-tender               Rectovaginal: External wnl   Impression:   The primary encounter diagnosis was Abnormal urine odor. Diagnoses of Chronic pelvic pain in  female, Excessive or frequent menstruation, and Intramural leiomyoma of uterus were also pertinent to this visit.    Plan:    Patient returns for a preoperative discussion regarding her plans to proceed with surgical treatment of her AUB, pelvic pain by total laparoscopic hysterectomy with bilateral salpingectomy procedure. We will perform a cystoscopy to evaluate the urinary tract after the procedure if we are concerned, but because she has bladder pain frequently we will not do it opportunistically.    She is planning to stay overnight.  She will need home health for the week after surgery, and I will place that referral postoperatively.  The patient and I discussed the technical aspects of the procedure including the potential for risks and complications. These include but are not limited to the risk of infection requiring post-operative antibiotics or further procedures. We talked about the risk of injury to adjacent organs including bladder, bowel, ureter, blood vessels or nerves. We talked about the need to convert to an open incision. We talked about the possible need for blood transfusion. We talked aboutpostop complications such asthromboembolic or cardiopulmonary complications. All of her questions were answered.  Her preoperative exam was completed and the appropriate consents were signed.

## 2018-07-08 NOTE — Patient Instructions (Signed)
Your procedure is scheduled on: 07/11/18 Fri Report to Same Day Surgery 2nd floor medical mall [facility] Entrance-take elevator on left to 2nd floor.  Check in with surgery information desk.) To find out your arrival time please call [phone] between 1PM - 3PM on 07/10/18 Thurs  Remember: Instructions that are not followed completely may result in serious medical risk, up to and including death, or upon the discretion of your surgeon and anesthesiologist your surgery may need to be rescheduled.    _x___ 1. Do not eat food after midnight the night before your procedure. You may drink clear liquids up to 2 hours before you are scheduled to arrive at the hospital for your procedure.  Do not drink clear liquids within 2 hours of your scheduled arrival to the hospital.  Clear liquids include  --Water or Apple juice without pulp  --Clear carbohydrate beverage such as ClearFast or Gatorade  --Black Coffee or Clear Tea (No milk, no creamers, do not add anything to                  the coffee or Tea Type 1 and type 2 diabetics should only drink water.   ____Ensure clear carbohydrate drink on the way to the hospital for bariatric patients  ____Ensure clear carbohydrate drink 3 hours before surgery for Dr Dwyane Luo patients if physician instructed.   No gum chewing or hard candies.     __x__ 2. No Alcohol for 24 hours before or after surgery.   __x__3. No Smoking or e-cigarettes for 24 prior to surgery.  Do not use any chewable tobacco products for at least 6 hour prior to surgery   ____  4. Bring all medications with you on the day of surgery if instructed.    __x__ 5. Notify your doctor if there is any change in your medical condition     (cold, fever, infections).    x___6. On the morning of surgery brush your teeth with toothpaste and water.  You may rinse your mouth with mouth wash if you wish.  Do not swallow any toothpaste or mouthwash.   Do not wear jewelry, make-up, hairpins,  clips or nail polish.  Do not wear lotions, powders, or perfumes. You may wear deodorant.  Do not shave 48 hours prior to surgery. Men may shave face and neck.  Do not bring valuables to the hospital.    [facility] is not responsible for any belongings or valuables.               Contacts, dentures or bridgework may not be worn into surgery.  Leave your suitcase in the car. After surgery it may be brought to your room.  For patients admitted to the hospital, discharge time is determined by your                       treatment team.  _  Patients discharged the day of surgery will not be allowed to drive home.  You will need someone to drive you home and stay with you the night of your procedure.    Please read over the following fact sheets that you were given:   [facility] Preparing for Surgery and or MRSA Information   _x___ Take anti-hypertensive listed below, cardiac, seizure, asthma,     anti-reflux and psychiatric medicines. These include:  1. None  2.  3.  4.  5.  6.  ____Fleets enema or Magnesium Citrate as directed.  _x___ Use CHG Soap or sage wipes as directed on instruction sheet   ____ Use inhalers on the day of surgery and bring to hospital day of surgery  ____ Stop Metformin and Janumet 2 days prior to surgery.    ____ Take 1/2 of usual insulin dose the night before surgery and none on the morning     surgery.   _x___ Follow recommendations from Cardiologist, Pulmonologist or PCP regarding          stopping Aspirin, Coumadin, Plavix ,Eliquis, Effient, or Pradaxa, and Pletal.  X____Stop Anti-inflammatories such as Advil, Aleve, Ibuprofen, Motrin, Naproxen, Naprosyn, Goodies powders or aspirin products. OK to take Tylenol and                          Celebrex.   _x___ Stop supplements until after surgery.  But may continue Vitamin D, Vitamin B,       and multivitamin.   ____ Bring C-Pap to the hospital.

## 2018-07-10 MED ORDER — CEFAZOLIN SODIUM-DEXTROSE 2-4 GM/100ML-% IV SOLN
2.0000 g | INTRAVENOUS | Status: AC
  Administered 2018-07-11: 2 g via INTRAVENOUS

## 2018-07-11 ENCOUNTER — Encounter
Admission: RE | Disposition: A | Discharge status: Home / Self Care | Payer: Self-pay | Source: Ambulatory Visit | Attending: Obstetrics and Gynecology

## 2018-07-11 ENCOUNTER — Ambulatory Visit: Admitting: Certified Registered Nurse Anesthetist

## 2018-07-11 ENCOUNTER — Observation Stay
Admission: RE | Admit: 2018-07-11 | Discharge: 2018-07-12 | Disposition: A | Discharge status: Home / Self Care | Source: Ambulatory Visit | Attending: Obstetrics and Gynecology | Admitting: Obstetrics and Gynecology

## 2018-07-11 ENCOUNTER — Other Ambulatory Visit: Payer: Self-pay

## 2018-07-11 ENCOUNTER — Encounter: Payer: Self-pay | Admitting: Emergency Medicine

## 2018-07-11 DIAGNOSIS — R102 Pelvic and perineal pain: Secondary | ICD-10-CM | POA: Insufficient documentation

## 2018-07-11 DIAGNOSIS — Z882 Allergy status to sulfonamides status: Secondary | ICD-10-CM | POA: Diagnosis not present

## 2018-07-11 DIAGNOSIS — N920 Excessive and frequent menstruation with regular cycle: Secondary | ICD-10-CM | POA: Diagnosis not present

## 2018-07-11 DIAGNOSIS — R8290 Unspecified abnormal findings in urine: Secondary | ICD-10-CM | POA: Insufficient documentation

## 2018-07-11 DIAGNOSIS — Z881 Allergy status to other antibiotic agents status: Secondary | ICD-10-CM | POA: Insufficient documentation

## 2018-07-11 DIAGNOSIS — Z79899 Other long term (current) drug therapy: Secondary | ICD-10-CM | POA: Insufficient documentation

## 2018-07-11 DIAGNOSIS — D251 Intramural leiomyoma of uterus: Secondary | ICD-10-CM | POA: Insufficient documentation

## 2018-07-11 DIAGNOSIS — Z888 Allergy status to other drugs, medicaments and biological substances status: Secondary | ICD-10-CM | POA: Diagnosis not present

## 2018-07-11 DIAGNOSIS — N83292 Other ovarian cyst, left side: Secondary | ICD-10-CM | POA: Diagnosis present

## 2018-07-11 HISTORY — PX: LAPAROSCOPIC HYSTERECTOMY: SHX1926

## 2018-07-11 HISTORY — PX: LAPAROSCOPIC OVARIAN CYSTECTOMY: SHX6248

## 2018-07-11 HISTORY — PX: LAPAROSCOPIC BILATERAL SALPINGECTOMY: SHX5889

## 2018-07-11 LAB — ABO/RH: ABO/RH(D): O POS

## 2018-07-11 LAB — PREGNANCY, URINE: Preg Test, Ur: NEGATIVE

## 2018-07-11 LAB — POCT PREGNANCY, URINE: PREG TEST UR: NEGATIVE

## 2018-07-11 SURGERY — HYSTERECTOMY, TOTAL, LAPAROSCOPIC
Anesthesia: General

## 2018-07-11 MED ORDER — ONDANSETRON HCL 4 MG/2ML IJ SOLN: 4.0000 mg | Freq: Four times a day (QID) | INTRAMUSCULAR | Status: DC | PRN

## 2018-07-11 MED ORDER — FENTANYL CITRATE (PF) 100 MCG/2ML IJ SOLN
INTRAMUSCULAR | Status: DC | PRN
  Administered 2018-07-11 (×4): 50 ug via INTRAVENOUS

## 2018-07-11 MED ORDER — FENTANYL CITRATE (PF) 100 MCG/2ML IJ SOLN
INTRAMUSCULAR | Status: AC
  Administered 2018-07-11: 25 ug via INTRAVENOUS
  Filled 2018-07-11: qty 2

## 2018-07-11 MED ORDER — KETOROLAC TROMETHAMINE 30 MG/ML IJ SOLN: 30.0000 mg | Freq: Once | INTRAMUSCULAR | Status: DC

## 2018-07-11 MED ORDER — FENTANYL CITRATE (PF) 100 MCG/2ML IJ SOLN
25.0000 ug | INTRAMUSCULAR | Status: AC | PRN
  Administered 2018-07-11 (×6): 25 ug via INTRAVENOUS

## 2018-07-11 MED ORDER — ROCURONIUM BROMIDE 50 MG/5ML IV SOLN
INTRAVENOUS | Status: AC
  Filled 2018-07-11: qty 1

## 2018-07-11 MED ORDER — ONDANSETRON HCL 4 MG PO TABS: 4.0000 mg | ORAL_TABLET | Freq: Four times a day (QID) | ORAL | Status: DC | PRN

## 2018-07-11 MED ORDER — NITROGLYCERIN 0.4 MG SL SUBL
0.4000 mg | SUBLINGUAL_TABLET | SUBLINGUAL | Status: DC | PRN
  Filled 2018-07-11: qty 25

## 2018-07-11 MED ORDER — DIPHENHYDRAMINE HCL 50 MG/ML IJ SOLN
25.0000 mg | Freq: Once | INTRAMUSCULAR | Status: AC
  Administered 2018-07-11: 25 mg via INTRAVENOUS

## 2018-07-11 MED ORDER — OXYCODONE HCL 5 MG PO TABS
5.0000 mg | ORAL_TABLET | ORAL | Status: DC | PRN
  Administered 2018-07-11 – 2018-07-12 (×4): 5 mg via ORAL
  Filled 2018-07-11 (×4): qty 1

## 2018-07-11 MED ORDER — ONDANSETRON HCL 4 MG/2ML IJ SOLN: 4.0000 mg | Freq: Once | INTRAMUSCULAR | Status: DC | PRN

## 2018-07-11 MED ORDER — MECLIZINE HCL 25 MG PO TABS
25.0000 mg | ORAL_TABLET | Freq: Three times a day (TID) | ORAL | Status: DC | PRN
  Filled 2018-07-11: qty 1

## 2018-07-11 MED ORDER — DEXAMETHASONE SODIUM PHOSPHATE 10 MG/ML IJ SOLN
INTRAMUSCULAR | Status: DC | PRN
  Administered 2018-07-11: 10 mg via INTRAVENOUS

## 2018-07-11 MED ORDER — ROCURONIUM BROMIDE 100 MG/10ML IV SOLN
INTRAVENOUS | Status: DC | PRN
  Administered 2018-07-11: 50 mg via INTRAVENOUS
  Administered 2018-07-11: 20 mg via INTRAVENOUS

## 2018-07-11 MED ORDER — DOCUSATE SODIUM 100 MG PO CAPS
100.0000 mg | ORAL_CAPSULE | Freq: Two times a day (BID) | ORAL | Status: DC
  Administered 2018-07-11 – 2018-07-12 (×3): 100 mg via ORAL
  Filled 2018-07-11 (×3): qty 1

## 2018-07-11 MED ORDER — KETAMINE HCL 10 MG/ML IJ SOLN
INTRAMUSCULAR | Status: DC | PRN
  Administered 2018-07-11: 50 mg via INTRAVENOUS

## 2018-07-11 MED ORDER — MIDAZOLAM HCL 2 MG/2ML IJ SOLN
INTRAMUSCULAR | Status: AC
  Filled 2018-07-11: qty 2

## 2018-07-11 MED ORDER — SUGAMMADEX SODIUM 200 MG/2ML IV SOLN
INTRAVENOUS | Status: AC
  Filled 2018-07-11: qty 2

## 2018-07-11 MED ORDER — ONDANSETRON HCL 4 MG/2ML IJ SOLN
INTRAMUSCULAR | Status: DC | PRN
  Administered 2018-07-11: 4 mg via INTRAVENOUS

## 2018-07-11 MED ORDER — KETOROLAC TROMETHAMINE 30 MG/ML IJ SOLN: 30.0000 mg | Freq: Four times a day (QID) | INTRAMUSCULAR | Status: AC

## 2018-07-11 MED ORDER — ACETAMINOPHEN 500 MG PO TABS
ORAL_TABLET | ORAL | Status: AC
  Administered 2018-07-11: 1000 mg via ORAL
  Filled 2018-07-11: qty 2

## 2018-07-11 MED ORDER — LIDOCAINE HCL (CARDIAC) PF 100 MG/5ML IV SOSY
PREFILLED_SYRINGE | INTRAVENOUS | Status: DC | PRN
  Administered 2018-07-11: 80 mg via INTRAVENOUS

## 2018-07-11 MED ORDER — DEXAMETHASONE SODIUM PHOSPHATE 10 MG/ML IJ SOLN
INTRAMUSCULAR | Status: AC
  Filled 2018-07-11: qty 1

## 2018-07-11 MED ORDER — LACTATED RINGERS IV SOLN: INTRAVENOUS | Status: DC

## 2018-07-11 MED ORDER — HYDROMORPHONE HCL 1 MG/ML IJ SOLN
INTRAMUSCULAR | Status: AC
  Administered 2018-07-11: 0.5 mg via INTRAVENOUS
  Filled 2018-07-11: qty 1

## 2018-07-11 MED ORDER — GABAPENTIN 300 MG PO CAPS
900.0000 mg | ORAL_CAPSULE | Freq: Every day | ORAL | Status: DC
  Administered 2018-07-11: 900 mg via ORAL
  Filled 2018-07-11: qty 3

## 2018-07-11 MED ORDER — ALPRAZOLAM 0.5 MG PO TABS: 0.5000 mg | ORAL_TABLET | Freq: Every evening | ORAL | Status: DC | PRN

## 2018-07-11 MED ORDER — BUPIVACAINE HCL 0.5 % IJ SOLN
INTRAMUSCULAR | Status: DC | PRN
  Administered 2018-07-11: 14 mL

## 2018-07-11 MED ORDER — DEXMEDETOMIDINE HCL 200 MCG/2ML IV SOLN
INTRAVENOUS | Status: DC | PRN
  Administered 2018-07-11 (×2): 8 ug via INTRAVENOUS
  Administered 2018-07-11: 4 ug via INTRAVENOUS

## 2018-07-11 MED ORDER — MIDAZOLAM HCL 2 MG/2ML IJ SOLN
INTRAMUSCULAR | Status: DC | PRN
  Administered 2018-07-11: 2 mg via INTRAVENOUS

## 2018-07-11 MED ORDER — LACTATED RINGERS IV SOLN
INTRAVENOUS | Status: DC
  Administered 2018-07-11: 07:00:00 via INTRAVENOUS

## 2018-07-11 MED ORDER — HYDROMORPHONE HCL 1 MG/ML IJ SOLN
0.5000 mg | INTRAMUSCULAR | Status: AC | PRN
  Administered 2018-07-11 (×4): 0.5 mg via INTRAVENOUS

## 2018-07-11 MED ORDER — ACETAMINOPHEN 500 MG PO TABS
1000.0000 mg | ORAL_TABLET | Freq: Four times a day (QID) | ORAL | Status: DC
  Administered 2018-07-11 – 2018-07-12 (×5): 1000 mg via ORAL
  Filled 2018-07-11 (×5): qty 2

## 2018-07-11 MED ORDER — FAMOTIDINE 20 MG PO TABS
20.0000 mg | ORAL_TABLET | Freq: Once | ORAL | Status: AC
  Administered 2018-07-11: 20 mg via ORAL

## 2018-07-11 MED ORDER — GABAPENTIN 400 MG PO CAPS
800.0000 mg | ORAL_CAPSULE | Freq: Once | ORAL | Status: AC
  Administered 2018-07-11: 800 mg via ORAL

## 2018-07-11 MED ORDER — VITAMIN C 500 MG PO TABS
1000.0000 mg | ORAL_TABLET | Freq: Every day | ORAL | Status: DC
  Administered 2018-07-11 – 2018-07-12 (×2): 1000 mg via ORAL
  Filled 2018-07-11 (×2): qty 2

## 2018-07-11 MED ORDER — FAMOTIDINE 20 MG PO TABS
ORAL_TABLET | ORAL | Status: AC
  Administered 2018-07-11: 20 mg via ORAL
  Filled 2018-07-11: qty 1

## 2018-07-11 MED ORDER — CEFAZOLIN SODIUM-DEXTROSE 2-4 GM/100ML-% IV SOLN
INTRAVENOUS | Status: AC
  Filled 2018-07-11: qty 100

## 2018-07-11 MED ORDER — ACETAMINOPHEN 10 MG/ML IV SOLN
INTRAVENOUS | Status: AC
  Filled 2018-07-11: qty 100

## 2018-07-11 MED ORDER — PROPOFOL 10 MG/ML IV BOLUS
INTRAVENOUS | Status: AC
  Filled 2018-07-11: qty 20

## 2018-07-11 MED ORDER — PROPOFOL 10 MG/ML IV BOLUS
INTRAVENOUS | Status: DC | PRN
  Administered 2018-07-11: 170 mg via INTRAVENOUS

## 2018-07-11 MED ORDER — VITAMIN D3 25 MCG (1000 UNIT) PO TABS
1000.0000 [IU] | ORAL_TABLET | Freq: Every day | ORAL | Status: DC
  Administered 2018-07-11 – 2018-07-12 (×2): 1000 [IU] via ORAL
  Filled 2018-07-11 (×4): qty 1

## 2018-07-11 MED ORDER — KETOROLAC TROMETHAMINE 30 MG/ML IJ SOLN
INTRAMUSCULAR | Status: AC
  Filled 2018-07-11: qty 1

## 2018-07-11 MED ORDER — LIDOCAINE HCL (PF) 2 % IJ SOLN
INTRAMUSCULAR | Status: AC
  Filled 2018-07-11: qty 10

## 2018-07-11 MED ORDER — PHENYLEPHRINE HCL 10 MG/ML IJ SOLN
INTRAMUSCULAR | Status: AC
  Filled 2018-07-11: qty 1

## 2018-07-11 MED ORDER — KETAMINE HCL 50 MG/ML IJ SOLN
INTRAMUSCULAR | Status: AC
  Filled 2018-07-11: qty 10

## 2018-07-11 MED ORDER — GABAPENTIN 400 MG PO CAPS
ORAL_CAPSULE | ORAL | Status: AC
  Administered 2018-07-11: 800 mg via ORAL
  Filled 2018-07-11: qty 2

## 2018-07-11 MED ORDER — SCOPOLAMINE 1 MG/3DAYS TD PT72
1.0000 | MEDICATED_PATCH | TRANSDERMAL | Status: DC
  Administered 2018-07-11: 1.5 mg via TRANSDERMAL

## 2018-07-11 MED ORDER — ACETAMINOPHEN 500 MG PO TABS
1000.0000 mg | ORAL_TABLET | Freq: Once | ORAL | Status: AC
  Administered 2018-07-11: 1000 mg via ORAL

## 2018-07-11 MED ORDER — KETOROLAC TROMETHAMINE 30 MG/ML IJ SOLN
30.0000 mg | Freq: Four times a day (QID) | INTRAMUSCULAR | Status: AC
  Administered 2018-07-11 – 2018-07-12 (×3): 30 mg via INTRAVENOUS
  Filled 2018-07-11 (×3): qty 1

## 2018-07-11 MED ORDER — SIMETHICONE 80 MG PO CHEW: 80.0000 mg | CHEWABLE_TABLET | Freq: Four times a day (QID) | ORAL | Status: DC | PRN

## 2018-07-11 MED ORDER — IBUPROFEN 800 MG PO TABS
800.0000 mg | ORAL_TABLET | Freq: Three times a day (TID) | ORAL | Status: DC
  Administered 2018-07-12: 800 mg via ORAL
  Filled 2018-07-11: qty 1

## 2018-07-11 MED ORDER — SUGAMMADEX SODIUM 500 MG/5ML IV SOLN
INTRAVENOUS | Status: AC
  Filled 2018-07-11: qty 5

## 2018-07-11 MED ORDER — SUGAMMADEX SODIUM 200 MG/2ML IV SOLN
INTRAVENOUS | Status: DC | PRN
  Administered 2018-07-11: 400 mg via INTRAVENOUS

## 2018-07-11 MED ORDER — DEXMEDETOMIDINE HCL IN NACL 200 MCG/50ML IV SOLN
INTRAVENOUS | Status: AC
  Filled 2018-07-11: qty 50

## 2018-07-11 MED ORDER — LACTATED RINGERS IV SOLN
INTRAVENOUS | Status: DC
  Administered 2018-07-11 – 2018-07-12 (×3): via INTRAVENOUS

## 2018-07-11 MED ORDER — FENTANYL CITRATE (PF) 250 MCG/5ML IJ SOLN
INTRAMUSCULAR | Status: AC
  Filled 2018-07-11: qty 5

## 2018-07-11 MED ORDER — KETOROLAC TROMETHAMINE 30 MG/ML IJ SOLN
INTRAMUSCULAR | Status: DC | PRN
  Administered 2018-07-11: 30 mg via INTRAVENOUS

## 2018-07-11 MED ORDER — ALUM & MAG HYDROXIDE-SIMETH 200-200-20 MG/5ML PO SUSP: 30.0000 mL | ORAL | Status: DC | PRN

## 2018-07-11 MED ORDER — MENTHOL 3 MG MT LOZG
1.0000 | LOZENGE | OROMUCOSAL | Status: DC | PRN
  Filled 2018-07-11: qty 9

## 2018-07-11 MED ORDER — PHENYLEPHRINE HCL 10 MG/ML IJ SOLN
INTRAMUSCULAR | Status: DC | PRN
  Administered 2018-07-11 (×4): 100 ug via INTRAVENOUS

## 2018-07-11 MED ORDER — DIPHENHYDRAMINE HCL 25 MG PO CAPS
25.0000 mg | ORAL_CAPSULE | Freq: Four times a day (QID) | ORAL | Status: DC | PRN
  Administered 2018-07-11: 25 mg via ORAL
  Filled 2018-07-11: qty 1

## 2018-07-11 MED ORDER — BUPIVACAINE HCL (PF) 0.5 % IJ SOLN
INTRAMUSCULAR | Status: AC
  Filled 2018-07-11: qty 30

## 2018-07-11 MED ORDER — DIPHENHYDRAMINE HCL 50 MG/ML IJ SOLN
INTRAMUSCULAR | Status: AC
  Administered 2018-07-11: 25 mg via INTRAVENOUS
  Filled 2018-07-11: qty 1

## 2018-07-11 MED ORDER — ONDANSETRON HCL 4 MG/2ML IJ SOLN
INTRAMUSCULAR | Status: AC
  Filled 2018-07-11: qty 2

## 2018-07-11 MED ORDER — HYDROMORPHONE HCL 1 MG/ML IJ SOLN
INTRAMUSCULAR | Status: AC
  Administered 2018-07-11: 0.5 mg via INTRAVENOUS
  Filled 2018-07-11: qty 0.5

## 2018-07-11 MED ORDER — SCOPOLAMINE 1 MG/3DAYS TD PT72
MEDICATED_PATCH | TRANSDERMAL | Status: AC
  Filled 2018-07-11: qty 1

## 2018-07-11 SURGICAL SUPPLY — 63 items
BAG URINE DRAINAGE (UROLOGICAL SUPPLIES) ×8 IMPLANT
BLADE SURG SZ11 CARB STEEL (BLADE) ×4 IMPLANT
CATH FOLEY 2WAY  5CC 16FR (CATHETERS) ×1
CATH ROBINSON RED A/P 16FR (CATHETERS) ×4 IMPLANT
CATH URTH 16FR FL 2W BLN LF (CATHETERS) ×3 IMPLANT
CHLORAPREP W/TINT 26ML (MISCELLANEOUS) ×4 IMPLANT
CORD MONOPOLAR M/FML 12FT (MISCELLANEOUS) ×4 IMPLANT
COUNTER NEEDLE 20/40 LG (NEEDLE) ×4 IMPLANT
COVER LIGHT HANDLE STERIS (MISCELLANEOUS) ×8 IMPLANT
DERMABOND ADVANCED (GAUZE/BANDAGES/DRESSINGS) ×2
DERMABOND ADVANCED .7 DNX12 (GAUZE/BANDAGES/DRESSINGS) ×6 IMPLANT
DEVICE SUTURE ENDOST 10MM (ENDOMECHANICALS) ×4 IMPLANT
DRAPE STERI POUCH LG 24X46 STR (DRAPES) ×4 IMPLANT
DRSG TEGADERM 2-3/8X2-3/4 SM (GAUZE/BANDAGES/DRESSINGS) ×12 IMPLANT
GLOVE BIO SURGEON STRL SZ7 (GLOVE) ×20 IMPLANT
GLOVE INDICATOR 7.5 STRL GRN (GLOVE) ×20 IMPLANT
GOWN STRL REUS W/ TWL LRG LVL3 (GOWN DISPOSABLE) ×6 IMPLANT
GOWN STRL REUS W/ TWL XL LVL3 (GOWN DISPOSABLE) ×3 IMPLANT
GOWN STRL REUS W/TWL LRG LVL3 (GOWN DISPOSABLE) ×2
GOWN STRL REUS W/TWL XL LVL3 (GOWN DISPOSABLE) ×1
GRASPER SUT TROCAR 14GX15 (MISCELLANEOUS) ×8 IMPLANT
IRRIGATION STRYKERFLOW (MISCELLANEOUS) ×3 IMPLANT
IRRIGATOR STRYKERFLOW (MISCELLANEOUS) ×4
IV LACTATED RINGERS 1000ML (IV SOLUTION) ×4 IMPLANT
IV NS 1000ML (IV SOLUTION) ×1
IV NS 1000ML BAXH (IV SOLUTION) ×3 IMPLANT
KIT PINK PAD W/HEAD ARE REST (MISCELLANEOUS) ×4
KIT PINK PAD W/HEAD ARM REST (MISCELLANEOUS) ×3 IMPLANT
KIT TURNOVER CYSTO (KITS) ×4 IMPLANT
LABEL OR SOLS (LABEL) ×4 IMPLANT
LIGASURE VESSEL 5MM BLUNT TIP (ELECTROSURGICAL) ×4 IMPLANT
MANIPULATOR VCARE LG CRV RETR (MISCELLANEOUS) IMPLANT
MANIPULATOR VCARE SML CRV RETR (MISCELLANEOUS) IMPLANT
MANIPULATOR VCARE STD CRV RETR (MISCELLANEOUS) ×4 IMPLANT
NS IRRIG 500ML POUR BTL (IV SOLUTION) ×4 IMPLANT
OCCLUDER COLPOPNEUMO (BALLOONS) ×4 IMPLANT
PACK GYN LAPAROSCOPIC (MISCELLANEOUS) ×4 IMPLANT
PAD OB MATERNITY 4.3X12.25 (PERSONAL CARE ITEMS) ×4 IMPLANT
PAD PREP 24X41 OB/GYN DISP (PERSONAL CARE ITEMS) ×4 IMPLANT
PORT ACCESS TROCAR AIRSEAL 5 (TROCAR) ×4 IMPLANT
POUCH SPECIMEN RETRIEVAL 10MM (ENDOMECHANICALS) IMPLANT
SCISSORS METZENBAUM CVD 33 (INSTRUMENTS) ×4 IMPLANT
SET CYSTO W/LG BORE CLAMP LF (SET/KITS/TRAYS/PACK) ×4 IMPLANT
SET TRI-LUMEN FLTR TB AIRSEAL (TUBING) ×4 IMPLANT
SLEEVE ENDOPATH XCEL 5M (ENDOMECHANICALS) IMPLANT
SPONGE GAUZE 2X2 8PLY STRL LF (GAUZE/BANDAGES/DRESSINGS) ×8 IMPLANT
STRIP CLOSURE SKIN 1/4X4 (GAUZE/BANDAGES/DRESSINGS) IMPLANT
SURGILUBE 2OZ TUBE FLIPTOP (MISCELLANEOUS) ×4 IMPLANT
SUT ENDO VLOC 180-0-8IN (SUTURE) ×4 IMPLANT
SUT MNCRL 4-0 (SUTURE) ×2
SUT MNCRL 4-0 27XMFL (SUTURE) ×6
SUT MNCRL AB 4-0 PS2 18 (SUTURE) IMPLANT
SUT VIC AB 0 CT1 36 (SUTURE) ×8 IMPLANT
SUT VIC AB 2-0 UR6 27 (SUTURE) IMPLANT
SUT VIC AB 4-0 SH 27 (SUTURE)
SUT VIC AB 4-0 SH 27XANBCTRL (SUTURE) IMPLANT
SUTURE MNCRL 4-0 27XMF (SUTURE) ×6 IMPLANT
SYR 10ML LL (SYRINGE) ×4 IMPLANT
SYR 50ML LL SCALE MARK (SYRINGE) ×4 IMPLANT
TROCAR ENDO BLADELESS 11MM (ENDOMECHANICALS) IMPLANT
TROCAR XCEL NON-BLD 5MMX100MML (ENDOMECHANICALS) ×4 IMPLANT
TUBING INSUF HEATED (TUBING) ×4 IMPLANT
TUBING INSUFFLATION (TUBING) ×4 IMPLANT

## 2018-07-11 NOTE — Anesthesia Postprocedure Evaluation (Signed)
Anesthesia Post Note  Patient: [name]  Procedure(s) Performed: HYSTERECTOMY TOTAL LAPAROSCOPIC (N/A ) LAPAROSCOPIC BILATERAL SALPINGECTOMY (Bilateral ) LAPAROSCOPIC OVARIAN CYSTECTOMY (Left )  Patient location during evaluation: PACU Anesthesia Type: General Level of consciousness: awake and alert Pain management: pain level controlled Vital Signs Assessment: post-procedure vital signs reviewed and stable Respiratory status: spontaneous breathing and respiratory function stable Cardiovascular status: stable Anesthetic complications: no     Last Vitals:  Vitals:   07/11/18 0612 07/11/18 1018  BP: (!) 164/113 118/83  Pulse: 90 70  Resp: 17 17  Temp: (!) 36.4 C (!) 36.1 C  SpO2: 96% 100%    Last Pain:  Vitals:   07/11/18 1018  TempSrc:   PainSc: 0-No pain                 Angelise Petrich K

## 2018-07-11 NOTE — Progress Notes (Signed)
Only speak with daughter and son about medical issues. Do not disclose with patients mom.

## 2018-07-11 NOTE — Progress Notes (Signed)
Day of Surgery Procedure(s): HYSTERECTOMY TOTAL LAPAROSCOPIC (N/A) LAPAROSCOPIC BILATERAL SALPINGECTOMY (Bilateral) LAPAROSCOPIC OVARIAN CYSTECTOMY (Left) Subjective: Pain is adequately controlled. No SOB or CP. Resting quietly.   Objective: Vital signs in last 24 hours: Temp:  [97 F (36.1 C)-98.4 F (36.9 C)] 98.4 F (36.9 C) (09/06 2041) Pulse Rate:  [54-90] 67 (09/06 2041) Resp:  [10-21] 16 (09/06 1602) BP: (108-164)/(76-113) 140/89 (09/06 2041) SpO2:  [90 %-100 %] 96 % (09/06 2041) Weight:  [96.7 kg] 96.7 kg (09/06 0612)  Intake/Output  Intake/Output Summary (Last 24 hours) at 07/11/2018 2109 Last data filed at 07/11/2018 1848 Gross per 24 hour  Intake 1240 ml  Output 380 ml  Net 860 ml    Physical Exam:  General: Alert and oriented. CV: RRR Lungs: Clear bilaterally. GI: Soft, Nondistended. Incisions: Clean and dry. Urine: Clear,  Extremities: Nontender, no erythema, no edema.  Assessment/Plan: POD# 0.  1) Encourage Incentive spirometry 2) Advance diet as tolerated, but may do clears overnight 3) Continue oral pain medication, with IV rescue as needed 4) OOB to chair for 4 hours this afternoon   Benjaman Kindler, MD   LOS: 0 days   Benjaman Kindler 07/11/2018, 9:09 PM

## 2018-07-11 NOTE — Anesthesia Post-op Follow-up Note (Signed)
Anesthesia QCDR form completed.        

## 2018-07-11 NOTE — Anesthesia Procedure Notes (Signed)
Procedure Name: Intubation Date/Time: 07/11/2018 7:47 AM Performed by: Eben Burow, CRNA Pre-anesthesia Checklist: Patient identified, Emergency Drugs available, Suction available, Patient being monitored and Timeout performed Patient Re-evaluated:Patient Re-evaluated prior to induction Oxygen Delivery Method: Circle system utilized Preoxygenation: Pre-oxygenation with 100% oxygen Induction Type: IV induction Ventilation: Mask ventilation without difficulty Laryngoscope Size: Mac and 3 Grade View: Grade I Tube type: Oral Tube size: 7.5 mm Number of attempts: 1 Airway Equipment and Method: Stylet and LTA kit utilized Placement Confirmation: ETT inserted through vocal cords under direct vision,  positive ETCO2 and breath sounds checked- equal and bilateral Secured at: 22 cm Tube secured with: Tape Dental Injury: Teeth and Oropharynx as per pre-operative assessment

## 2018-07-11 NOTE — Interval H&P Note (Signed)
History and Physical Interval Note:  07/11/2018 7:24 AM  [name]  has presented today for surgery, with the diagnosis of chronic pelvic pain  The various methods of treatment have been discussed with the patient and family. After consideration of risks, benefits and other options for treatment, the patient has consented to  Procedure(s): HYSTERECTOMY TOTAL LAPAROSCOPIC (N/A) LAPAROSCOPIC BILATERAL SALPINGECTOMY (Bilateral) CYSTOSCOPY (N/A) as a surgical intervention .  The patient's history has been reviewed, patient examined, no change in status, stable for surgery.  I have reviewed the patient's chart and labs.  Questions were answered to the patient's satisfaction.     Benjaman Kindler

## 2018-07-11 NOTE — Op Note (Addendum)
Mashell J Black PROCEDURE DATE: 07/11/2018  PREOPERATIVE DIAGNOSIS: Menorrhagia, left ovarian cyst POSTOPERATIVE DIAGNOSIS: The same PROCEDURE: Total laparoscopic hysterectomy, bilateral salpingectomy, lysis of adhesions, left ovarian cystectomy SURGEON:  Dr. Benjaman Kindler ASSISTANT: Dr. Vikki Ports Ward Anesthesiologist:  Anesthesiologist: Gunnar Fusi, MD CRNA: Eben Burow, CRNA; Nelda Marseille, CRNA  INDICATIONS: 42 y.o. [phone]  here for definitive surgical management secondary to the indications listed under preoperative diagnoses; please see preoperative note for further details.  Risks of surgery were discussed with the patient including but not limited to: bleeding which may require transfusion or reoperation; infection which may require antibiotics; injury to bowel, bladder, ureters or other surrounding organs; need for additional procedures; thromboembolic phenomenon, incisional problems and other postoperative/anesthesia complications. Written informed consent was obtained.    FINDINGS:  Enlarged uterus, deviated to the left with adhesions to the pelvic side wall, and adhesions over site of removed gall bladder in RUQ. These were removed with ligasure Left ovary with solid 2 cm cyst that was removed  ANESTHESIA:    General INTRAVENOUS FLUIDS:700  ml ESTIMATED BLOOD LOSS:30 ml URINE OUTPUT: 200 ml   SPECIMENS: Uterus, cervix, bilateral fallopian tubes, left ovarian cyst COMPLICATIONS: None immediate  PROCEDURE IN DETAIL:  The patient received prophalactic intravenous antibiotics and had sequential compression devices applied to her lower extremities while in the preoperative area.  She was then taken to the operating room where general anesthesia was administered and was found to be adequate.  She was placed in the dorsal lithotomy position, and was prepped and draped in a sterile manner.  A formal time out was performed with all team members present and in agreement.  A V-care  uterine manipulator was placed at this time.  A Foley catheter was inserted into her bladder and attached to constant drainage. Attention was turned to the abdomen and 0.5% Marcaine infused subq. A 27mm umbilical incision was made with the scalpel.  The Optiview 5-mm trocar and sleeve were then advanced without difficulty with the laparoscope under direct visualization into the abdomen.  The abdomen was then insufflated with carbon dioxide gas and adequate pneumoperitoneum was obtained.  A survey of the patient's pelvis and abdomen revealed the findings above.  Bilateral lower quadrant ports (5 mm on the right and 11 mm on the left) were then placed under direct visualization.  The pelvis was then carefully examined.    A left ovarian cyst was resected using ligasure in its entirety.  Attention was turned to the fallopian tubes; these were freed from the underlying mesosalpinx and the uterine attachments using the Ligasure device.  The bilateral round and broad ligaments were then clamped and transected with the Ligasure device.  The uterine artery was then skeletonized and a bladder flap was created.  The ureters were noted to be safely away from the area of dissection.  The bladder was then bluntly dissected off the lower uterine segment.    At this point, attention was turned to the uterine vessels, which were clamped and cauterized using the Ligasure on the left, and then the right. After the uterine blood flow at the level of the internal os was controlled, both arteries were cut with the Ligasure.  Good hemostasis was noted overall.  The uterosacral and cardinal ligaments were clamped, cut and ligated bilaterally .  Attention was then turned to the cervicovaginal junction, and monopolar scissors were used to transect the cervix from the surrounding vagina using the ring of the V-care as a guide. This was  done circumferentially allowing total hysterectomy.  The uterus was then removed from the vagina and  the vaginal cuff incision was then closed with running V-loc suture.  Overall excellent hemostasis was noted.    Attention was returned to the abdomen.The ureters were reexamined bilaterally and were pulsating normally. The abdominal pressure was reduced and hemostasis was confirmed.    The 76mm port fascia was closed with a vertical mattress with 0-Vicryl, using the cone closure system. All trocars were removed under direct visualization, and the abdomen was desufflated.  All skin incisions were closed with 4-0 Vicryl subcuticular stitches and Dermabond. The patient tolerated the procedures well.  All instruments, needles, and sponge counts were correct x 2. The patient was taken to the recovery room awake, extubated and in stable condition.   She will stay overnight at her request. ERAS used.

## 2018-07-11 NOTE — Anesthesia Preprocedure Evaluation (Signed)
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),  Anxiety    GI/Hepatic Neg liver ROS, neg GERD  ,  Endo/Other  neg diabetes  Renal/GU negative Renal ROS     Musculoskeletal   Abdominal   Peds  Hematology   Anesthesia Other Findings   Reproductive/Obstetrics                             Anesthesia Physical Anesthesia Plan  ASA: III  Anesthesia Plan: General   Post-op Pain Management:    Induction: Intravenous  PONV Risk Score and Plan: 4 or greater and Dexamethasone, Ondansetron, Midazolam and Scopolamine patch - Pre-op  Airway Management Planned: Oral ETT  Additional Equipment:   Intra-op Plan:   Post-operative Plan:   Informed Consent: I have reviewed the patients History and Physical, chart, labs and discussed the procedure including the risks, benefits and alternatives for the proposed anesthesia with the patient or authorized representative who has indicated his/her understanding and acceptance.     Plan Discussed with:   Anesthesia Plan Comments:         Anesthesia Quick Evaluation

## 2018-07-11 NOTE — Transfer of Care (Signed)
Immediate Anesthesia Transfer of Care Note  Patient: [name]  Procedure(s) Performed: HYSTERECTOMY TOTAL LAPAROSCOPIC (N/A ) LAPAROSCOPIC BILATERAL SALPINGECTOMY (Bilateral ) CYSTOSCOPY (N/A )  Patient Location: PACU  Anesthesia Type:General  Level of Consciousness: drowsy  Airway & Oxygen Therapy: Patient Spontanous Breathing and Patient connected to face mask oxygen  Post-op Assessment: Report given to RN and Post -op Vital signs reviewed and stable  Post vital signs: Reviewed and stable  Last Vitals:  Vitals Value Taken Time  BP    Temp    Pulse 63 07/11/2018 10:20 AM  Resp 15 07/11/2018 10:20 AM  SpO2 100 % 07/11/2018 10:20 AM  Vitals shown include unvalidated device data.  Last Pain:  Vitals:   07/11/18 0612  TempSrc: Tympanic  PainSc: 0-No pain         Complications: No apparent anesthesia complications

## 2018-07-12 DIAGNOSIS — N83292 Other ovarian cyst, left side: Secondary | ICD-10-CM | POA: Diagnosis not present

## 2018-07-12 LAB — CBC
HCT: 33 % — ABNORMAL LOW (ref 35.0–47.0)
Hemoglobin: 11.3 g/dL — ABNORMAL LOW (ref 12.0–16.0)
MCH: 28.8 pg (ref 26.0–34.0)
MCHC: 34.2 g/dL (ref 32.0–36.0)
MCV: 84.3 fL (ref 80.0–100.0)
PLATELETS: 333 10*3/uL (ref 150–440)
RBC: 3.91 MIL/uL (ref 3.80–5.20)
RDW: 14.8 % — ABNORMAL HIGH (ref 11.5–14.5)
WBC: 9.8 10*3/uL (ref 3.6–11.0)

## 2018-07-12 LAB — BASIC METABOLIC PANEL
Anion gap: 6 (ref 5–15)
BUN: 11 mg/dL (ref 6–20)
CO2: 27 mmol/L (ref 22–32)
CREATININE: 0.62 mg/dL (ref 0.44–1.00)
Calcium: 8.7 mg/dL — ABNORMAL LOW (ref 8.9–10.3)
Chloride: 107 mmol/L (ref 98–111)
GFR calc Af Amer: >60 mL/min (ref >60)
Glucose, Bld: 121 mg/dL — ABNORMAL HIGH (ref 70–99)
Potassium: 3.9 mmol/L (ref 3.5–5.1)
SODIUM: 140 mmol/L (ref 135–145)

## 2018-07-12 LAB — URINE CULTURE
CULTURE: NO GROWTH
SPECIAL REQUESTS: NORMAL

## 2018-07-12 MED ORDER — OXYCODONE HCL 5 MG PO CAPS: 5.0000 mg | ORAL_CAPSULE | Freq: Four times a day (QID) | ORAL | 0 refills | Status: DC | PRN

## 2018-07-12 MED ORDER — ACETAMINOPHEN 500 MG PO TABS: 1000.0000 mg | ORAL_TABLET | Freq: Four times a day (QID) | ORAL | 0 refills | Status: AC

## 2018-07-12 MED ORDER — METOCLOPRAMIDE HCL 5 MG PO TABS: 5.0000 mg | ORAL_TABLET | Freq: Three times a day (TID) | ORAL | 1 refills | Status: DC | PRN

## 2018-07-12 MED ORDER — DOCUSATE SODIUM 100 MG PO CAPS: 100.0000 mg | ORAL_CAPSULE | Freq: Two times a day (BID) | ORAL | 0 refills | Status: DC

## 2018-07-12 MED ORDER — GABAPENTIN 800 MG PO TABS: 800.0000 mg | ORAL_TABLET | Freq: Every day | ORAL | 0 refills | Status: DC

## 2018-07-12 MED ORDER — IBUPROFEN 800 MG PO TABS: 800.0000 mg | ORAL_TABLET | Freq: Three times a day (TID) | ORAL | 1 refills | Status: DC | PRN

## 2018-07-12 NOTE — Progress Notes (Signed)
Discharge instructions complete and prescriptions sent to pharmacy by provider. Patient verbalizes understanding of teaching. Patient discharged home via wheelchair at 1750.

## 2018-07-12 NOTE — Discharge Instructions (Signed)
Discharge instructions after   total laparoscopic hysterectomy   For the next three days, take ibuprofen and acetaminophen on a schedule, every 8 hours. You can take them together or you can intersperse them, and take one every four hours. I also gave you gabapentin for nighttime, to help you sleep and also to control pain. Take gabapentin medicines at night for at least the next 3 nights. You also have a narcotic, oxycodone, to take as needed if the above medicines don't help.  Postop constipation is a major cause of pain. Stay well hydrated, walk as you tolerate, and take over the counter senna as well as stool softeners if you need them.    Signs and Symptoms to Report Call our office at [phone] if you have any of the following.  . Fever over 100.4 degrees or higher . Severe stomach pain not relieved with pain medications . Bright red bleeding that's heavier than a period that does not slow with rest . To go the bathroom a lot (frequency), you can't hold your urine (urgency), or it hurts when you empty your bladder (urinate) . Chest pain . Shortness of breath . Pain in the calves of your legs . Severe nausea and vomiting not relieved with anti-nausea medications . Signs of infection around your wounds, such as redness, hot to touch, swelling, green/yellow drainage (like pus), bad smelling discharge . Any concerns  What You Can Expect after Surgery . You may see some pink tinged, bloody fluid and bruising around the wound. This is normal. . You may notice shoulder and neck pain. This is caused by the gas used during surgery to expand your abdomen so your surgeon could get to the uterus easier. . You may have a sore throat because of the tube in your mouth during general anesthesia. This will go away in 2 to 3 days. . You may have some stomach cramps. . You may notice spotting on your panties. . You may have pain around the incision sites.   Activities after Your  Discharge Follow these guidelines to help speed your recovery at home: . Do the coughing and deep breathing as you did in the hospital for 2 weeks. Use the small blue breathing device, called the incentive spirometer for 2 weeks. . Don't drive if you are in pain or taking narcotic pain medicine. You may drive when you can safely slam on the brakes, turn the wheel forcefully, and rotate your torso comfortably. This is typically 1-2 weeks. Practice in a parking lot or side street prior to attempting to drive regularly.  . Ask others to help with household chores for 4 weeks. . Do not lift anything heavier that 10 pounds for 4-6 weeks. This includes pets, children, and groceries. . Don't do strenuous activities, exercises, or sports like vacuuming, tennis, squash, etc. until your doctor says it is safe to do so. ---Maintain pelvic rest for 8 weeks. This means nothing in the vagina or rectum at all (no douching, tampons, intercourse) for 8 weeks.  . Walk as you feel able. Rest often since it may take two or three weeks for your energy level to return to normal.  . You may climb stairs . Avoid constipation:   -Eat fruits, vegetables, and whole grains. Eat small meals as your appetite will take time to return to normal.   -Drink 6 to 8 glasses of water each day unless your doctor has told you to limit your fluids.   -Use a laxative   or stool softener as needed if constipation becomes a problem. You may take Miralax, metamucil, Citrucil, Colace, Senekot, FiberCon, etc. If this does not relieve the constipation, try two tablespoons of Milk Of Magnesia every 8 hours until your bowels move.   You may shower. Gently wash the wounds with a mild soap and water. Pat dry.  Do not get in a hot tub, swimming pool, etc. for 6 weeks.  Do not use lotions, oils, powders on the wounds.  Do not douche, use tampons, or have sex until your doctor says it is okay.  Take your pain medicine when you need it. The medicine  may not work as well if the pain is bad.  Take the medicines you were taking before surgery. Other medications you will need are pain medications and possibly constipation and nausea medications (Reglan).    Here is a helpful article from the website [employer], regarding constipation  Here are reasons why constipation occurs after surgery: 1) During the operation and in the recovery room, most people are given opioid pain medication, primarily through an IV, to treat moderate or severe pain. Intravenous opioids include morphine, Dilaudid and fentanyl. After surgery, patients are often prescribed opioid pain medication to take by mouth at home, including codeine, Vicodin, Norco, and Percocet. All of these medications cause constipation by slowing down the movement of your intestine. 2) Changes in your diet before surgery can be another culprit. It is common to get specific instructions to change how you normally eat or drink before your surgery, like only having liquids the day before or not having anything to eat or drink after midnight the night before surgery. For this reason, temporary dehydration may occur. This, along with not eating or only having liquids, means that you are getting less fiber than usual. Both these factors contribute to constipation. 3) Changes in your diet after surgery can also contribute to the problem. Although many people dont have dietary restrictions after operations, being under anesthesia can make you lose your appetite for several hours and maybe even days. Some people can even have nausea or vomiting. Not eating or drinking normally means that you are not getting enough fiber and you can get dehydrated, both leading to constipation. 4) Lying in a bed more than usual--which happens before, during and after surgery--combined with the medications and diet changes, all work together to slow down your colon and make your poop turn to rock.  No one likes to be  constipated.  Lets face it, its not a pleasant feeling when you dont poop for days, then strain on the toilet to finally pass something large enough to cause damage. An ounce of prevention is worth a pound of cure, so: 1. Assume you will be constipated. 2. Plan and prepare accordingly. Post-surgery is one of those unique situations where the temporary use of laxatives can make a world of difference. Always consult with your doctor, and recognize that if you wait several days after surgery to take a laxative, the constipation might be too severe for these over-the-counter options. It is always important to discuss all medications you plan on taking with your doctor. Ask your doctor if you can start the laxative immediately after surgery. *  Here are go-to post-surgery laxatives: Senna: Senna is an herb that acts as a stimulant laxative, meaning it increases the activity of the intestine to cause you to have a bowel movement. It comes in many forms, but senna pills are easy to take and  and are sold over the counter at almost all pharmacies. Since opioid pain medications slow down the activity of the intestine, it makes sense to take a medication to help reverse that side effect. Long-term use of a stimulant laxative is not a good idea since it can make your colon "lazy" and not function properly; however, temporary use immediately after surgery is acceptable. In general, if you are able to eat a normal diet, taking senna soon after surgery works the best. Senna usually works within hours to produce a bowel movement, but this is less predictable when you are taking different medications after surgery. Try not to wait several days to start taking senna, as often it is too late by then. Just like with all medications or supplements, check with your doctor before starting new treatment.   Magnesium: Magnesium is an important mineral that our body needs. We get magnesium from some foods that we eat, especially  foods that are high in fiber such as broccoli, almonds and whole grains. There are also magnesium-based medications used to treat constipation including milk of magnesia (magnesium hydroxide), magnesium citrate and magnesium oxide. They work by drawing water into the intestine, putting it into the class of "osmotic" laxatives. Magnesium products in low doses appear to be safe, but if taken in very large doses, can lead to problems such as irregular heartbeat, low blood pressure and even death. It can also affect other medications you might be taking, therefore it is important to discuss using magnesium with your physician and pharmacist before initiating therapy. Most over-the-counter magnesium laxatives work very well to help with the constipation related to surgery, but sometimes they work too well and lead to diarrhea. Make sure you are somewhere with easy access to a bathroom, just in case.   Bisacodyl: Bisacodyl (generic name) is sold under brand names such as Dulcolax. Much like senna, it is a "stimulant laxative," meaning it makes your intestines move more quickly to push out the stool. This is another good choice to start taking as soon as your doctor says you can take a laxative after surgery. It comes in pill form and as a suppository, which is a good choice for people who cannot or are not allowed to swallow pills. Studies have shown that it works as a laxative, but like most of these medications, you should use this on a short-term basis only.   Enema: Enemas strike fear in many people, but FEAR NOT! It's nowhere near as big a deal as you may think. An enema is just a way to get some liquid into your rectum by placing a specially designed device through your anus. If you have never done one, it might seem like a painful, unpleasant, uncomfortable, complicated and lengthy procedure. But in reality, it's simple, takes just a few seconds and is highly effective. The small ready-made bottles you buy at  the pharmacy are much easier than the hose/large rubber container type. Those recommended positions illustrated in some instructions are generally not necessary to place the enema. It's very similar to the insertion of a tampon, requiring a slight squat. Some extra lubrication on the enema's tip (or on your anus) will make it a breeze. In certain cases, there is no substitute for a good enema. For example, if someone has not pooped for a few days, the beginning of the poop waiting to come out can become rock hard. Passing that hard stool can lead to much pain and problems like anal fissures.   a little liquid to break up the rock-hard stool will help make its passage much easier. Enemas come with different liquids. Most come with saline, but there are also mineral oil options. You can also use warm water in the reusable enema containers. They all work. But since saline can sometimes be irritating, so try a mineral oil or water enema instead.  Here are commonly recommended constipation medications that do not work well for post-surgery constipation: Docusate: Docusate (generic name) most commonly referred to as Colace (brand name) is not really a laxative, but is classified as a stool softener. Although this medication is commonly prescribed, it is not recommended for several reasons: 1) there is no good medical evidence that it works 2) even if it has an effect, which is very questionable, it is minimal and cannot combat the intestinal slowing caused by the opioid medications. Skip docusate to save money and space in your pillbox for something more effective.  PEG: Miralax (brand name) is basically a chemical called polyethylene glycol (PEG) and it has gained tremendous popularity as a laxative. This product is an osmotic laxative meaning it works by pulling water into the stool, making it softer. This is very similar to the action of natural fiber in foods and supplements. Therefore, the effect seen  by this medication is not immediate, causing a bowel movement in a day or more. Is this medication strong enough to battle the constipation related to having an operation? Maybe for some people not prone to constipation. But for most people, other laxatives are better to prevent constipation after surgery.

## 2018-07-12 NOTE — Discharge Summary (Signed)
1 Day Post-Op       Procedure(s): HYSTERECTOMY TOTAL LAPAROSCOPIC (N/A) LAPAROSCOPIC BILATERAL SALPINGECTOMY (Bilateral) LAPAROSCOPIC OVARIAN CYSTECTOMY (Left) Subjective: The patient is doing well.  No nausea or vomiting. Pain is adequately controlled.  Objective: Vital signs in last 24 hours: Temp:  [97 F (36.1 C)-98.9 F (37.2 C)] 98.9 F (37.2 C) (09/07 0805) Pulse Rate:  [54-78] 68 (09/07 0805) Resp:  [10-21] 18 (09/07 0805) BP: (108-159)/(76-94) 143/83 (09/07 0805) SpO2:  [90 %-100 %] 98 % (09/07 0805)  Intake/Output  Intake/Output Summary (Last 24 hours) at 07/12/2018 0838 Last data filed at 07/12/2018 0810 Gross per 24 hour  Intake 3255.19 ml  Output 2980 ml  Net 275.19 ml    Physical Exam:  General: Alert and oriented. CV: RRR Lungs: Clear bilaterally. GI: Soft, Nondistended. Incisions: Clean and dry. Urine: Clear, Foley in place Extremities: Nontender, no erythema, no edema.  Lab Results: Recent Labs    07/12/18 0514  HGB 11.3*  HCT 33.0*  WBC 9.8  PLT 333                 Results for orders placed or performed during the hospital encounter of 07/11/18 (from the past 24 hour(s))  Pregnancy, urine     Status: None   Collection Time: 07/11/18  9:00 AM  Result Value Ref Range   Preg Test, Ur NEGATIVE NEGATIVE  CBC     Status: Abnormal   Collection Time: 07/12/18  5:14 AM  Result Value Ref Range   WBC 9.8 3.6 - 11.0 K/uL   RBC 3.91 3.80 - 5.20 MIL/uL   Hemoglobin 11.3 (L) 12.0 - 16.0 g/dL   HCT 33.0 (L) 35.0 - 47.0 %   MCV 84.3 80.0 - 100.0 fL   MCH 28.8 26.0 - 34.0 pg   MCHC 34.2 32.0 - 36.0 g/dL   RDW 14.8 (H) 11.5 - 14.5 %   Platelets 333 150 - 440 K/uL  Basic metabolic panel     Status: Abnormal   Collection Time: 07/12/18  5:14 AM  Result Value Ref Range   Sodium 140 135 - 145 mmol/L   Potassium 3.9 3.5 - 5.1 mmol/L   Chloride 107 98 - 111 mmol/L   CO2 27 22 - 32 mmol/L   Glucose, Bld 121 (H) 70 - 99 mg/dL   BUN 11 6 - 20 mg/dL   Creatinine, Ser 0.62 0.44 - 1.00 mg/dL   Calcium 8.7 (L) 8.9 - 10.3 mg/dL   GFR calc non Af Amer >60 >60 mL/min   GFR calc Af Amer >60 >60 mL/min   Anion gap 6 5 - 15    Assessment/Plan: 1 Day Post-Op       Procedure(s): HYSTERECTOMY TOTAL LAPAROSCOPIC (N/A) LAPAROSCOPIC BILATERAL SALPINGECTOMY (Bilateral) LAPAROSCOPIC OVARIAN CYSTECTOMY (Left)  1) Ambulate, Incentive spirometry 2) Advance diet as tolerated 3) Transition to oral pain medication 5) Discharge home today anticipated    Benjaman Kindler, MD   LOS: 0 days   Benjaman Kindler 07/12/2018, 8:38 AM

## 2018-07-14 LAB — SURGICAL PATHOLOGY

## 2018-10-09 ENCOUNTER — Encounter (HOSPITAL_COMMUNITY): Payer: Self-pay | Admitting: Emergency Medicine

## 2018-10-09 ENCOUNTER — Emergency Department (HOSPITAL_COMMUNITY)
Admission: EM | Admit: 2018-10-09 | Discharge: 2018-10-09 | Disposition: A | Discharge status: Home / Self Care | Attending: Emergency Medicine | Admitting: Emergency Medicine

## 2018-10-09 ENCOUNTER — Emergency Department (HOSPITAL_COMMUNITY)

## 2018-10-09 DIAGNOSIS — Z79899 Other long term (current) drug therapy: Secondary | ICD-10-CM | POA: Diagnosis not present

## 2018-10-09 DIAGNOSIS — I10 Essential (primary) hypertension: Secondary | ICD-10-CM | POA: Insufficient documentation

## 2018-10-09 DIAGNOSIS — R079 Chest pain, unspecified: Secondary | ICD-10-CM | POA: Diagnosis not present

## 2018-10-09 DIAGNOSIS — Z91040 Latex allergy status: Secondary | ICD-10-CM | POA: Insufficient documentation

## 2018-10-09 DIAGNOSIS — R0789 Other chest pain: Secondary | ICD-10-CM

## 2018-10-09 LAB — CBC
HCT: 40.1 % (ref 36.0–46.0)
HEMOGLOBIN: 12.4 g/dL (ref 12.0–15.0)
MCH: 27.4 pg (ref 26.0–34.0)
MCHC: 30.9 g/dL (ref 30.0–36.0)
MCV: 88.7 fL (ref 80.0–100.0)
Platelets: 404 10*3/uL — ABNORMAL HIGH (ref 150–400)
RBC: 4.52 MIL/uL (ref 3.87–5.11)
RDW: 14 % (ref 11.5–15.5)
WBC: 4.8 10*3/uL (ref 4.0–10.5)
nRBC: 0 % (ref 0.0–0.2)

## 2018-10-09 LAB — BASIC METABOLIC PANEL
ANION GAP: 9 (ref 5–15)
BUN: 10 mg/dL (ref 6–20)
CALCIUM: 9 mg/dL (ref 8.9–10.3)
CO2: 26 mmol/L (ref 22–32)
Chloride: 104 mmol/L (ref 98–111)
Creatinine, Ser: 0.56 mg/dL (ref 0.44–1.00)
Glucose, Bld: 89 mg/dL (ref 70–99)
POTASSIUM: 3.9 mmol/L (ref 3.5–5.1)
Sodium: 139 mmol/L (ref 135–145)

## 2018-10-09 LAB — I-STAT BETA HCG BLOOD, ED (NOT ORDERABLE): I-stat hCG, quantitative: <5 m[IU]/mL (ref <5)

## 2018-10-09 LAB — POCT I-STAT TROPONIN I: Troponin i, poc: 0.01 ng/mL (ref 0.00–0.08)

## 2018-10-09 NOTE — ED Notes (Signed)
Patient states she would not like her medical information discussed in the hallway.  Placed in conference room for provider evaluation.

## 2018-10-09 NOTE — ED Provider Notes (Signed)
[facility] DEPT Provider Note   CSN: [id number] Arrival date & time: 10/09/18  1008     History   Chief Complaint Chief Complaint  Patient presents with  . Chest Pain    HPI [name] is a 42 y.o. female.  42 year old female with prior medical history as detailed below presents for evaluation of chest discomfort.  Patient reports ongoing chest discomfort that is been a problem for the last month.  She reports continuous discomfort to the mid chest for the last month.  She does have a prior history of fibromyalgia and mitral valve prolapse.  Her symptoms are consistent with prior episodes of discomfort reportedly related to her MVP.  She denies associated shortness of breath, nausea, vomiting, diaphoresis, or other acute complaint.  She has discussed her symptoms with her regular care provider who is set her up for an echocardiogram.  This echocardiogram has not yet been performed.  The history is provided by the patient and medical records.  Chest Pain   This is a chronic problem. The current episode started more than 1 week ago. The problem occurs daily. The problem has not changed since onset.The pain is present in the substernal region and lateral region. The pain is mild. The quality of the pain is described as dull. The pain does not radiate. Duration of episode(s) is 1 month. Pertinent negatives include no fever, no orthopnea, no palpitations, no shortness of breath, no syncope and no vomiting.    Past Medical History:  Diagnosis Date  . Asthma    As a child  . Degenerative disk disease   . Dyspnea   . Dysrhythmia   . Fibromyalgia   . Hypertension   . Migraine   . Mitral valve prolapse   . Ovarian cyst   . Palpitations   . Panic attacks   . Renal disorder   . Scoliosis   . Seizure ([place])    after birth of  child due to high BP  . UTI (lower urinary tract infection)   . Vertigo     Patient Active Problem List   Diagnosis Date  Noted  . Menorrhagia 07/11/2018  . Pelvic pain in female 01/09/2016  . Panic attacks 09/15/2015  . Hypertension 09/15/2015  . Scoliosis 09/15/2015  . Renal disorder 09/15/2015  . Asthma 09/15/2015  . Sepsis due Pyelonephritis   09/15/2015  . Pyelonephritis 09/15/2015  . BV (bacterial vaginosis) 03/22/2014  . Unspecified symptom associated with female genital organs 03/08/2014  . Unspecified disorder of menstruation and other abnormal bleeding from female genital tract 03/08/2014  . Mitral valve disorder 07/17/2010  . PALPITATIONS 07/17/2010  . PRECORDIAL PAIN 07/17/2010  . OTH NONSPECIFIC ABNORM CV SYSTEM FUNCTION STUDY 07/17/2010    Past Surgical History:  Procedure Laterality Date  . BACK SURGERY    . CESAREAN SECTION    . CHOLECYSTECTOMY    . CYSTOSCOPY  01/09/2016   Procedure: CYSTOSCOPY;  Surgeon: Benjaman Kindler, MD;  Location: [facility] ORS;  Service: Gynecology;;  . ESOPHAGOGASTRODUODENOSCOPY ENDOSCOPY    . HYSTEROSCOPY W/D&C N/A 01/09/2016   Procedure: DILATATION AND CURETTAGE /HYSTEROSCOPY/endometrial pap smear;  Surgeon: Benjaman Kindler, MD;  Location: [facility] ORS;  Service: Gynecology;  Laterality: N/A;  . KNEE SURGERY    . LAPAROSCOPIC BILATERAL SALPINGECTOMY Bilateral 07/11/2018   Procedure: LAPAROSCOPIC BILATERAL SALPINGECTOMY;  Surgeon: Benjaman Kindler, MD;  Location: [facility] ORS;  Service: Gynecology;  Laterality: Bilateral;  . LAPAROSCOPIC HYSTERECTOMY N/A 07/11/2018   Procedure: HYSTERECTOMY TOTAL LAPAROSCOPIC;  Surgeon: Benjaman Kindler, MD;  Location: [facility] ORS;  Service: Gynecology;  Laterality: N/A;  . LAPAROSCOPIC OVARIAN CYSTECTOMY Left 07/11/2018   Procedure: LAPAROSCOPIC OVARIAN CYSTECTOMY;  Surgeon: Benjaman Kindler, MD;  Location: [facility] ORS;  Service: Gynecology;  Laterality: Left;  . LAPAROSCOPY    . Radio Frequency Albiation     . TUBAL LIGATION       OB History    Gravida  3   Para  3   Term  2   Preterm  1   AB      Living  3     SAB      TAB       Ectopic      Multiple      Live Births  3            Home Medications    Prior to Admission medications   Medication Sig Start Date End Date Taking? Authorizing Provider  ALPRAZolam Duanne Moron) 0.5 MG tablet Take 1 tablet by mouth at bedtime as needed. 04/10/18   [provider]  Ascorbic Acid (VITAMIN C) 1000 MG tablet Take 1,000 mg by mouth daily.    [provider]  Capsicum, Cayenne, (CAYENNE PO) Take 1 capsule by mouth daily.    [provider]  docusate sodium (COLACE) 100 MG capsule Take 1 capsule (100 mg total) by mouth 2 (two) times daily. To keep stools soft 07/12/18   Benjaman Kindler, MD  ECHINACEA PO Take 1 tablet by mouth daily.    [provider]  gabapentin (NEURONTIN) 800 MG tablet Take 1 tablet (800 mg total) by mouth at bedtime for 14 days. Take nightly for 3 days, then up to 14 days as needed 07/12/18 07/26/18  Benjaman Kindler, MD  Ginger, Zingiber officinalis, (GINGER ROOT PO) Take 1 tablet by mouth daily.    [provider]  ibuprofen (ADVIL,MOTRIN) 800 MG tablet Take 1 tablet (800 mg total) by mouth every 8 (eight) hours as needed for moderate pain. 07/12/18   Benjaman Kindler, MD  meclizine (ANTIVERT) 25 MG tablet Take 1 tablet (25 mg total) by mouth 3 (three) times daily. Patient taking differently: Take 25 mg by mouth 3 (three) times daily as needed for dizziness or nausea.  06/09/16   Carmin Muskrat, MD  metoCLOPramide (REGLAN) 5 MG tablet Take 1 tablet (5 mg total) by mouth every 8 (eight) hours as needed for nausea or vomiting. 07/12/18 07/12/19  Benjaman Kindler, MD  nitroGLYCERIN (NITROSTAT) 0.4 MG SL tablet Place 0.4 mg under the tongue every 5 (five) minutes as needed for chest pain.     [provider]  oxycodone (OXY-IR) 5 MG capsule Take 1 capsule (5 mg total) by mouth every 6 (six) hours as needed for pain. 07/12/18   Ward, Honor Loh, MD  TURMERIC PO Take 1 capsule by mouth daily.    [provider]    Vitamin D, Cholecalciferol, 1000 units TABS Take 1,000 Units by mouth daily.     [provider]  clonazePAM (KLONOPIN) 1 MG tablet Take 1 mg by mouth 4 (four) times daily.    01/09/12  [provider]  metoprolol tartrate (LOPRESSOR) 25 MG tablet Take 25 mg by mouth 2 (two) times daily.    01/09/12  [provider]    Family History Family History  Problem Relation Age of Onset  . Hypertension Mother   . Diabetes Mother   . Stroke Mother   . Heart disease Mother   .  Hypertension Father   . Diabetes Father   . Dementia Father   . Schizophrenia Father     Social History Social History   Tobacco Use  . Smoking status: Never Smoker  . Smokeless tobacco: Never Used  Substance Use Topics  . Alcohol use: No  . Drug use: No     Allergies   Sulfonamide derivatives; Ciprofloxacin; Dilaudid [hydromorphone hcl]; Latex; Morphine and related; Mushroom extract complex; Norvasc [amlodipine]; Pineapple; and Amlodipine besylate   Review of Systems Review of Systems  Constitutional: Negative for fever.  Respiratory: Negative for shortness of breath.   Cardiovascular: Positive for chest pain. Negative for palpitations, orthopnea and syncope.  Gastrointestinal: Negative for vomiting.  All other systems reviewed and are negative.    Physical Exam Updated Vital Signs BP (!) 171/104 (BP Location: Left Arm)   Pulse 91   Temp 98.6 F (37 C) (Oral)   Resp (!) 21   Ht 5' 3.5" (1.613 m)   Wt 95.3 kg   LMP 06/23/2018   SpO2 100%   BMI 36.62 kg/m   Physical Exam  Constitutional: She is oriented to person, place, and time. She appears well-developed and well-nourished. No distress.  HENT:  Head: Normocephalic and atraumatic.  Mouth/Throat: Oropharynx is clear and moist.  Eyes: Pupils are equal, round, and reactive to light. Conjunctivae and EOM are normal.  Neck: Normal range of motion. Neck supple.  Cardiovascular: Normal rate, regular rhythm, normal heart  sounds and normal pulses.  Pulmonary/Chest: Effort normal and breath sounds normal. No respiratory distress.  Abdominal: Soft. She exhibits no distension. There is no tenderness.  Musculoskeletal: Normal range of motion. She exhibits no edema or deformity.  Neurological: She is alert and oriented to person, place, and time.  Skin: Skin is warm and dry.  Psychiatric: She has a normal mood and affect.  Nursing note and vitals reviewed.    ED Treatments / Results  Labs (all labs ordered are listed, but only abnormal results are displayed) Labs Reviewed  CBC - Abnormal; Notable for the following components:      Result Value   Platelets 404 (*)    All other components within normal limits  BASIC METABOLIC PANEL  I-STAT TROPONIN, ED  I-STAT BETA HCG BLOOD, ED (NOT ORDERABLE)  POCT I-STAT TROPONIN I    EKG EKG Interpretation  Date/Time:  Thursday October 09 2018 10:19:05 EST Ventricular Rate:  84 PR Interval:    QRS Duration: 94 QT Interval:  370 QTC Calculation: 438 R Axis:   55 Text Interpretation:  Sinus rhythm Baseline wander in lead(s) I aVR Confirmed by Dene Gentry [phone]) on 10/09/2018 11:22:26 AM   Radiology Dg Chest 2 View  Result Date: 10/09/2018 CLINICAL DATA:  Chest pain for 1 week EXAM: CHEST - 2 VIEW COMPARISON:  12/16/2017 FINDINGS: The heart size and mediastinal contours are within normal limits. Both lungs are clear. The visualized skeletal structures are unremarkable. IMPRESSION: No active cardiopulmonary disease. Electronically Signed   By: Inez Catalina M.D.   On: 10/09/2018 11:26    Procedures Procedures (including critical care time)  Medications Ordered in ED Medications - No data to display   Initial Impression / Assessment and Plan / ED Course  I have reviewed the triage vital signs and the nursing notes.  Pertinent labs & imaging results that were available during my care of the patient were reviewed by me and considered in my medical  decision making (see chart for details).  MDM  Screen complete  Patient is presenting for evaluation of atypical chest discomfort.  Patient with ongoing symptoms every day for the last month.  EKG is without evidence of acute ischemia.  Troponin is negative.  Other screening labs are without significant abnormality.  Patient does understand the importance of close follow-up.    She desires discharge after her ED evaluation.  Strict return precautions given and understood.   Final Clinical Impressions(s) / ED Diagnoses   Final diagnoses:  Atypical chest pain    ED Discharge Orders    None       Valarie Merino, MD 10/09/18 1242

## 2018-10-09 NOTE — Discharge Instructions (Addendum)
Please return for any problem.  Follow-up with your regular care provider as instructed.  Follow-up with cardiology as instructed. 

## 2018-10-09 NOTE — ED Triage Notes (Signed)
Per pt, states CP for about a week-states she has a history of mitral valve prolapse-was suppose to get an ECHO done but has'nt-states she also has a history of HTN-

## 2018-10-13 ENCOUNTER — Ambulatory Visit (INDEPENDENT_AMBULATORY_CARE_PROVIDER_SITE_OTHER): Admitting: Cardiovascular Disease

## 2018-10-13 ENCOUNTER — Encounter: Payer: Self-pay | Admitting: Cardiovascular Disease

## 2018-10-13 VITALS — BP 137/88 | HR 86 | Ht 63.0 in | Wt 216.6 lb

## 2018-10-13 DIAGNOSIS — I341 Nonrheumatic mitral (valve) prolapse: Secondary | ICD-10-CM

## 2018-10-13 DIAGNOSIS — I10 Essential (primary) hypertension: Secondary | ICD-10-CM

## 2018-10-13 DIAGNOSIS — Z6835 Body mass index (BMI) 35.0-35.9, adult: Secondary | ICD-10-CM

## 2018-10-13 DIAGNOSIS — R0602 Shortness of breath: Secondary | ICD-10-CM

## 2018-10-13 DIAGNOSIS — R072 Precordial pain: Secondary | ICD-10-CM

## 2018-10-13 NOTE — Patient Instructions (Signed)
Medication Instructions:  Dr Sallyanne Kuster recommends that you continue on your current medications as directed. Please refer to the Current Medication list given to you today.  If you need a refill on your cardiac medications before your next appointment, please call your pharmacy.   Testing/Procedures: Your physician has requested that you have an echocardiogram. Echocardiography is a painless test that uses sound waves to create images of your heart. It provides your doctor with information about the size and shape of your heart and how well your heart's chambers and valves are working. This procedure takes approximately one hour. There are no restrictions for this procedure.  >> This will be performed at our [facility] location [place], [place] [address] [phone]  Follow-Up: Dr Sallyanne Kuster recommends that you schedule a follow-up appointment in 3 months.

## 2018-10-13 NOTE — Progress Notes (Signed)
Cardiology Office Note:    Date:  10/17/2018   ID:  [name], DOB 1976/05/02, MRN [id number]  PCP:  Heather Hait, PA-C  Cardiologist:  No primary care provider on file.  Electrophysiologist:  None   Referring MD: Heather Hait, PA-C   Chief Complaint  Patient presents with  . Chest Pain    History of Present Illness:    [name] is a 42 y.o. female with a hx of mitral valve prolapse, fibromyalgia and recently started on meds for HTN, was seen in ED on 12/05 for ongoing chest pain for about one month, similar to multiple previous episodes of chest pain. The discomfort radiates from the high right parasternal area across the chest diagonally towards the apical area. The pain is sharp, non-exertional, positional (worse lying on left side), non-pleuritic. Acetaminophen does not help. She avoids NSAIDs due to stomach issues. She has chronic mild exertional dyspnea, which she attributes to her weight.  She was seen in [place] about a year ago for similar complaints. Troponin was high, she was kept overnight and she had a normal stress test the next day.  She denies dyspnea, palpitations, syncope, edema, neurological complaints, claudication, headaches. She has had a variety of orthopedic problems involving her right knee (patella dislocation), fracture of right cuboid, cervical radiculopathy, sciatica, mastalgia.  The diagnosis of MVP was established by echo about 25 years ago. She was started on bisoprolol-HCTZ about 3 weeks ago. Echo was recommended, not yet performed.  BP is still mildly high. She had swelling with amlodipine.  She has multilevel scoliosis. She had lumbar spine RF ablation for pain, but it did not help. She had a total hysterectomy in Sept 2019 for pelvic pain, menorrhagia, ovarian cyst.  Her mother had a "hole in her heart" at birth, developed CAD late in life.e  Past Medical History:  Diagnosis Date  . Asthma    As a child  . Degenerative disk  disease   . Dyspnea   . Dysrhythmia   . Fibromyalgia   . Hypertension   . Migraine   . Mitral valve prolapse   . Ovarian cyst   . Palpitations   . Panic attacks   . Renal disorder   . Scoliosis   . Seizure ([place])    after birth of  child due to high BP  . UTI (lower urinary tract infection)   . Vertigo     Past Surgical History:  Procedure Laterality Date  . BACK SURGERY    . CESAREAN SECTION    . CHOLECYSTECTOMY    . CYSTOSCOPY  01/09/2016   Procedure: CYSTOSCOPY;  Surgeon: Benjaman Kindler, MD;  Location: [facility] ORS;  Service: Gynecology;;  . ESOPHAGOGASTRODUODENOSCOPY ENDOSCOPY    . HYSTEROSCOPY W/D&C N/A 01/09/2016   Procedure: DILATATION AND CURETTAGE /HYSTEROSCOPY/endometrial pap smear;  Surgeon: Benjaman Kindler, MD;  Location: [facility] ORS;  Service: Gynecology;  Laterality: N/A;  . KNEE SURGERY    . LAPAROSCOPIC BILATERAL SALPINGECTOMY Bilateral 07/11/2018   Procedure: LAPAROSCOPIC BILATERAL SALPINGECTOMY;  Surgeon: Benjaman Kindler, MD;  Location: [facility] ORS;  Service: Gynecology;  Laterality: Bilateral;  . LAPAROSCOPIC HYSTERECTOMY N/A 07/11/2018   Procedure: HYSTERECTOMY TOTAL LAPAROSCOPIC;  Surgeon: Benjaman Kindler, MD;  Location: [facility] ORS;  Service: Gynecology;  Laterality: N/A;  . LAPAROSCOPIC OVARIAN CYSTECTOMY Left 07/11/2018   Procedure: LAPAROSCOPIC OVARIAN CYSTECTOMY;  Surgeon: Benjaman Kindler, MD;  Location: [facility] ORS;  Service: Gynecology;  Laterality: Left;  . LAPAROSCOPY    . Radio Frequency Albiation     .  TUBAL LIGATION      Current Medications: Current Meds  Medication Sig  . Ascorbic Acid (VITAMIN C) 1000 MG tablet Take 1,000 mg by mouth daily.  . bisoprolol-hydrochlorothiazide (ZIAC) 2.5-6.25 MG tablet Take by mouth.  . Capsicum, Cayenne, (CAYENNE PO) Take 1 capsule by mouth daily.  Marland Kitchen docusate sodium (COLACE) 100 MG capsule Take 1 capsule (100 mg total) by mouth 2 (two) times daily. To keep stools soft  . ECHINACEA PO Take 1 tablet by mouth daily.  . Ginger,  Zingiber officinalis, (GINGER ROOT PO) Take 1 tablet by mouth daily.  Marland Kitchen ibuprofen (ADVIL,MOTRIN) 800 MG tablet Take 1 tablet (800 mg total) by mouth every 8 (eight) hours as needed for moderate pain.  . meclizine (ANTIVERT) 25 MG tablet Take 1 tablet (25 mg total) by mouth 3 (three) times daily. (Patient taking differently: Take 25 mg by mouth 3 (three) times daily as needed for dizziness or nausea. )  . nitroGLYCERIN (NITROSTAT) 0.4 MG SL tablet Place 0.4 mg under the tongue every 5 (five) minutes as needed for chest pain.   . TURMERIC PO Take 1 capsule by mouth daily.  . Vitamin D, Cholecalciferol, 1000 units TABS Take 1,000 Units by mouth daily.   . [DISCONTINUED] ALPRAZolam (XANAX) 0.5 MG tablet Take 1 tablet by mouth at bedtime as needed.  . [DISCONTINUED] bisoprolol-hydrochlorothiazide (ZIAC) 2.5-6.25 MG tablet Take 1 tablet by mouth daily.  . [DISCONTINUED] metoCLOPramide (REGLAN) 5 MG tablet Take 1 tablet (5 mg total) by mouth every 8 (eight) hours as needed for nausea or vomiting.  . [DISCONTINUED] oxycodone (OXY-IR) 5 MG capsule Take 1 capsule (5 mg total) by mouth every 6 (six) hours as needed for pain.     Allergies:   Sulfonamide derivatives; Ciprofloxacin; Dilaudid [hydromorphone hcl]; Latex; Morphine and related; Mushroom extract complex; Norvasc [amlodipine]; Pineapple; and Amlodipine besylate   Social History   Socioeconomic History  . Marital status: Married    Spouse name: Not on file  . Number of children: Not on file  . Years of education: Not on file  . Highest education level: Not on file  Occupational History  . Not on file  Social Needs  . Financial resource strain: Not on file  . Food insecurity:    Worry: Not on file    Inability: Not on file  . Transportation needs:    Medical: Not on file    Non-medical: Not on file  Tobacco Use  . Smoking status: Never Smoker  . Smokeless tobacco: Never Used  Substance and Sexual Activity  . Alcohol use: No  . Drug  use: No  . Sexual activity: Yes    Birth control/protection: Surgical    Comment: Tubal Ligation   Lifestyle  . Physical activity:    Days per week: Not on file    Minutes per session: Not on file  . Stress: Not on file  Relationships  . Social connections:    Talks on phone: Not on file    Gets together: Not on file    Attends religious service: Not on file    Active member of club or organization: Not on file    Attends meetings of clubs or organizations: Not on file    Relationship status: Not on file  Other Topics Concern  . Not on file  Social History Narrative   Married with 3 children in their teens   Student at [employer]     Family History: The patient's family history includes Dementia  in her father; Diabetes in her father and mother; Heart disease in her mother; Hypertension in her father and mother; Schizophrenia in her father; Stroke in her mother.  ROS:   Please see the history of present illness.     All other systems reviewed and are negative.  EKGs/Labs/Other Studies Reviewed:    The following studies were reviewed today: Notes from PCP and from ED  EKG:  EKG is not ordered today.  The ekg ordered 12/06 demonstrates NSR, nonspecific T wave changes.  Recent Labs: 10/09/2018: BUN 10; Creatinine, Ser 0.56; Hemoglobin 12.4; Platelets 404; Potassium 3.9; Sodium 139  Recent Lipid Panel    Component Value Date/Time   CHOL 138 06/12/2011 0625   TRIG 56 06/12/2011 0625   HDL 46 06/12/2011 0625   CHOLHDL 3.0 06/12/2011 0625   VLDL 11 06/12/2011 0625   LDLCALC 81 06/12/2011 0625    Physical Exam:    VS:  BP 137/88   Pulse 86   Ht 5\' 3"  (1.6 m)   Wt 216 lb 9.6 oz (98.2 kg)   LMP 06/23/2018   BMI 38.37 kg/m     Wt Readings from Last 3 Encounters:  10/13/18 216 lb 9.6 oz (98.2 kg)  10/09/18 210 lb (95.3 kg)  07/11/18 213 lb 3 oz (96.7 kg)     GEN: Severely obese. Well nourished, well developed in no acute distress HEENT: Normal NECK: No JVD; No carotid  bruits LYMPHATICS: No lymphadenopathy CARDIAC: RRR, no murmurs, rubs, gallops. No click and no murmur with Valsalva maneuver RESPIRATORY:  Clear to auscultation without rales, wheezing or rhonchi  ABDOMEN: Soft, non-tender, non-distended MUSCULOSKELETAL:  No edema; No deformity  SKIN: Warm and dry NEUROLOGIC:  Alert and oriented x 3 PSYCHIATRIC:  Normal affect   ASSESSMENT:    1. Precordial chest pain   2. Shortness of breath    PLAN:    In order of problems listed above:  1. Chest pain:  Very atypical, sounds musculoskeletal rather than cardiac. Could be related to scoliosis? 2. MVP: not supported by exam; diagnosis  Made in a period of lax echo criteria for diagnosis of MVP. Check echo. 3. HTN: will probably need dose titration to achieve BP<130/80. 4. Obesity:  Weight loss would help BP control.  Medication Adjustments/Labs and Tests Ordered: Current medicines are reviewed at length with the patient today.  Concerns regarding medicines are outlined above.  Orders Placed This Encounter  Procedures  . ECHOCARDIOGRAM COMPLETE   No orders of the defined types were placed in this encounter.   Patient Instructions  Medication Instructions:  Dr Sallyanne Kuster recommends that you continue on your current medications as directed. Please refer to the Current Medication list given to you today.  If you need a refill on your cardiac medications before your next appointment, please call your pharmacy.   Testing/Procedures: Your physician has requested that you have an echocardiogram. Echocardiography is a painless test that uses sound waves to create images of your heart. It provides your doctor with information about the size and shape of your heart and how well your heart's chambers and valves are working. This procedure takes approximately one hour. There are no restrictions for this procedure.  >> This will be performed at our [facility] location [place], [place] [address] [phone]  Follow-Up: Dr Sallyanne Kuster recommends that you schedule a follow-up appointment in 3 months.    Signed, Sanda Klein, MD  10/17/2018 8:35 PM    [facility]

## 2018-10-14 ENCOUNTER — Ambulatory Visit: Payer: Self-pay | Admitting: Cardiovascular Disease

## 2018-10-16 ENCOUNTER — Ambulatory Visit (HOSPITAL_COMMUNITY): Attending: Cardiovascular Disease

## 2018-10-16 ENCOUNTER — Other Ambulatory Visit: Payer: Self-pay

## 2018-10-16 DIAGNOSIS — R072 Precordial pain: Secondary | ICD-10-CM | POA: Diagnosis not present

## 2018-10-16 DIAGNOSIS — R0602 Shortness of breath: Secondary | ICD-10-CM | POA: Diagnosis present

## 2018-10-17 ENCOUNTER — Encounter: Payer: Self-pay | Admitting: Cardiovascular Disease

## 2018-10-22 ENCOUNTER — Telehealth: Payer: Self-pay

## 2018-10-22 MED ORDER — BISOPROLOL-HYDROCHLOROTHIAZIDE 5-6.25 MG PO TABS: 1.0000 | ORAL_TABLET | Freq: Every day | ORAL | 3 refills | Status: AC

## 2018-10-22 NOTE — Telephone Encounter (Signed)
-----   Message from Sanda Klein, MD sent at 10/17/2018  8:55 PM EST ----- Echo does not show mitral valve prolpase or any changes concerning for coronary disease. It does suggest that her HBP is important and we need to do a good job controlling it. I would suggest increasing bisoprolol HCTZ to the 5/6.25 mg once daily dose.

## 2018-10-22 NOTE — Telephone Encounter (Signed)
Results reviewed in mychart 10/17/18 at 9:23p. Called pt to notify her of medication change. Rx(s) sent to pharmacy electronically. Pt verbalized understanding and agreed with plan.

## 2019-01-27 ENCOUNTER — Telehealth: Payer: Self-pay

## 2019-01-27 NOTE — Telephone Encounter (Signed)
   Cardiac Questionnaire:    Since your last visit or hospitalization:    1. Have you been having new or worsening chest pain? PERIODICALLY, this is not a new problem and she's been felling somewhat better.   2. Have you been having new or worsening shortness of breath? NO 3. Have you been having new or worsening leg swelling, wt gain, or increase in abdominal girth (pants fitting more tightly)? YES, reports that this has been getting progressively worse.   4. Have you had any passing out spells? NO    *A YES to any of these questions would result in the appointment being kept. *If all the answers to these questions are NO, we should indicate that given the current situation regarding the worldwide coronarvirus pandemic, at the recommendation of the CDC, we are looking to limit gatherings in our waiting area, and thus will reschedule their appointment beyond four weeks from today.

## 2019-01-27 NOTE — Telephone Encounter (Signed)
   TELEPHONE CALL NOTE  This patient has been deemed a candidate for follow-up tele-health visit to limit community exposure during the Covid-19 pandemic. I spoke with the patient via phone to discuss instructions. This has been outlined on the patient's AVS (dotphrase: [email or web address]). The patient was advised to review the section on consent for treatment as well. The patient will receive a phone call 2-3 days prior to their E-Visit at which time consent will be verbally confirmed. A Virtual Office Visit appointment type has been scheduled for 10:20a with Dr Sallyanne Kuster.  Donivan Scull, [facility] 01/27/2019 2:38 PM

## 2019-01-29 ENCOUNTER — Other Ambulatory Visit: Payer: Self-pay

## 2019-01-30 ENCOUNTER — Telehealth (INDEPENDENT_AMBULATORY_CARE_PROVIDER_SITE_OTHER): Admitting: Cardiovascular Disease

## 2019-01-30 DIAGNOSIS — I10 Essential (primary) hypertension: Secondary | ICD-10-CM

## 2019-01-30 DIAGNOSIS — I059 Rheumatic mitral valve disease, unspecified: Secondary | ICD-10-CM

## 2019-01-30 DIAGNOSIS — Z6837 Body mass index (BMI) 37.0-37.9, adult: Secondary | ICD-10-CM | POA: Diagnosis not present

## 2019-01-30 NOTE — Patient Instructions (Signed)
Medication Instructions:  Dr Sallyanne Kuster recommends that you continue on your current medications as directed. Please refer to the Current Medication list given to you today.  If you need a refill on your cardiac medications before your next appointment, please call your pharmacy.   Follow-Up: At [facility], you and your health needs are our priority.  As part of our continuing mission to provide you with exceptional heart care, we have created designated Provider Care Teams.  These Care Teams include your primary Cardiologist (physician) and Advanced Practice Providers (APPs -  Physician Assistants and Nurse Practitioners) who all work together to provide you with the care you need, when you need it. You will need a follow up appointment in 12 months.  Please call our office 2 months in advance to schedule this appointment.  You may see Sanda Klein, MD or one of the following Advanced Practice Providers on your designated Care Team: [place], [place] . Fabian Sharp, PA-C . You will receive a reminder letter in the mail two months in advance. If you don't receive a letter, please call our office to schedule the follow-up appointment.    Low-Sodium Eating Plan Sodium, which is an element that makes up salt, helps you maintain a healthy balance of fluids in your body. Too much sodium can increase your blood pressure and cause fluid and waste to be held in your body. Your health care provider or dietitian may recommend following this plan if you have high blood pressure (hypertension), kidney disease, liver disease, or heart failure. Eating less sodium can help lower your blood pressure, reduce swelling, and protect your heart, liver, and kidneys. What are tips for following this plan? General guidelines  Most people on this plan should limit their sodium intake to 1,500-2,000 mg (milligrams) of sodium each day. Reading food labels   The Nutrition Facts label lists the amount of sodium in one  serving of the food. If you eat more than one serving, you must multiply the listed amount of sodium by the number of servings.  Choose foods with less than 140 mg of sodium per serving.  Avoid foods with 300 mg of sodium or more per serving. Shopping  Look for lower-sodium products, often labeled as "low-sodium" or "no salt added."  Always check the sodium content even if foods are labeled as "unsalted" or "no salt added".  Buy fresh foods. ? Avoid canned foods and premade or frozen meals. ? Avoid canned, cured, or processed meats  Buy breads that have less than 80 mg of sodium per slice. Cooking  Eat more home-cooked food and less restaurant, buffet, and fast food.  Avoid adding salt when cooking. Use salt-free seasonings or herbs instead of table salt or sea salt. Check with your health care provider or pharmacist before using salt substitutes.  Cook with plant-based oils, such as canola, sunflower, or olive oil. Meal planning  When eating at a restaurant, ask that your food be prepared with less salt or no salt, if possible.  Avoid foods that contain MSG (monosodium glutamate). MSG is sometimes added to [place] food, bouillon, and some canned foods. What foods are recommended? The items listed may not be a complete list. Talk with your dietitian about what dietary choices are best for you. Grains Low-sodium cereals, including oats, puffed wheat and rice, and shredded wheat. Low-sodium crackers. Unsalted rice. Unsalted pasta. Low-sodium bread. Whole-grain breads and whole-grain pasta. Vegetables Fresh or frozen vegetables. "No salt added" canned vegetables. "No salt added"  tomato sauce and paste. Low-sodium or reduced-sodium tomato and vegetable juice. Fruits Fresh, frozen, or canned fruit. Fruit juice. Meats and other protein foods Fresh or frozen (no salt added) meat, poultry, seafood, and fish. Low-sodium canned tuna and salmon. Unsalted nuts. Dried peas, beans, and lentils  without added salt. Unsalted canned beans. Eggs. Unsalted nut butters. Dairy Milk. Soy milk. Cheese that is naturally low in sodium, such as ricotta cheese, fresh mozzarella, or Swiss cheese Low-sodium or reduced-sodium cheese. Cream cheese. Yogurt. Fats and oils Unsalted butter. Unsalted margarine with no trans fat. Vegetable oils such as canola or olive oils. Seasonings and other foods Fresh and dried herbs and spices. Salt-free seasonings. Low-sodium mustard and ketchup. Sodium-free salad dressing. Sodium-free light mayonnaise. Fresh or refrigerated horseradish. Lemon juice. Vinegar. Homemade, reduced-sodium, or low-sodium soups. Unsalted popcorn and pretzels. Low-salt or salt-free chips. What foods are not recommended? The items listed may not be a complete list. Talk with your dietitian about what dietary choices are best for you. Grains Instant hot cereals. Bread stuffing, pancake, and biscuit mixes. Croutons. Seasoned rice or pasta mixes. Noodle soup cups. Boxed or frozen macaroni and cheese. Regular salted crackers. Self-rising flour. Vegetables Sauerkraut, pickled vegetables, and relishes. Olives. [place] fries. Onion rings. Regular canned vegetables (not low-sodium or reduced-sodium). Regular canned tomato sauce and paste (not low-sodium or reduced-sodium). Regular tomato and vegetable juice (not low-sodium or reduced-sodium). Frozen vegetables in sauces. Meats and other protein foods Meat or fish that is salted, canned, smoked, spiced, or pickled. Bacon, ham, sausage, hotdogs, corned beef, chipped beef, packaged lunch meats, salt pork, jerky, pickled herring, anchovies, regular canned tuna, sardines, salted nuts. Dairy Processed cheese and cheese spreads. Cheese curds. Blue cheese. Feta cheese. String cheese. Regular cottage cheese. Buttermilk. Canned milk. Fats and oils Salted butter. Regular margarine. Ghee. Bacon fat. Seasonings and other foods Onion salt, garlic salt, seasoned salt,  table salt, and sea salt. Canned and packaged gravies. Worcestershire sauce. Tartar sauce. Barbecue sauce. Teriyaki sauce. Soy sauce, including reduced-sodium. Steak sauce. Fish sauce. Oyster sauce. Cocktail sauce. Horseradish that you find on the shelf. Regular ketchup and mustard. Meat flavorings and tenderizers. Bouillon cubes. Hot sauce and Tabasco sauce. Premade or packaged marinades. Premade or packaged taco seasonings. Relishes. Regular salad dressings. Salsa. Potato and tortilla chips. Corn chips and puffs. Salted popcorn and pretzels. Canned or dried soups. Pizza. Frozen entrees and pot pies. Summary  Eating less sodium can help lower your blood pressure, reduce swelling, and protect your heart, liver, and kidneys.  Most people on this plan should limit their sodium intake to 1,500-2,000 mg (milligrams) of sodium each day.  Canned, boxed, and frozen foods are high in sodium. Restaurant foods, fast foods, and pizza are also very high in sodium. You also get sodium by adding salt to food.  Try to cook at home, eat more fresh fruits and vegetables, and eat less fast food, canned, processed, or prepared foods. This information is not intended to replace advice given to you by your health care provider. Make sure you discuss any questions you have with your health care provider. Document Released: 04/13/2002 Document Revised: 10/15/2016 Document Reviewed: 10/15/2016 Elsevier Interactive Patient Education  2019 [employer].

## 2019-01-30 NOTE — Progress Notes (Signed)
Virtual Visit via Telephone Note    Evaluation Performed:  Follow-up visit  This visit type was conducted due to national recommendations for restrictions regarding the COVID-19 Pandemic (e.g. social distancing).  This format is felt to be most appropriate for this patient at this time.  All issues noted in this document were discussed and addressed.  No physical exam was performed (except for noted visual exam findings with Video Visits).  Please refer to the patient's chart (MyChart message for video visits and phone note for telephone visits) for the patient's consent to telehealth for [facility].  Date:  01/30/2019   ID:  COBI ALDAPE, DOB November 06, 1975, MRN [id number]  Patient Location:  [address] [place] [address]   Provider location:   [place], [place]  PCP:  Dagmar Hait, PA-C  Cardiologist:  Rayyan Orsborn Electrophysiologist:  None   Chief Complaint:  MVP, HTN follow up  History of Present Illness:    [name] is a 43 y.o. female who presents via audio/video conferencing for a telehealth visit today.  In 2018-2019 she was evaluated twice for atypical chest pain, with normal workup (including a normal stress test). She has multilevel scoliosis, a variety of orthopedic problems involving her right knee (patella dislocation), fracture of right cuboid, cervical radiculopathy, sciatica, fibromyalgia, HTN. MVP was seen on an echo 25 years ago, but not confirmed on echo December 2019. The echo did show diastolic dysfunction, as indicated by mildly reduced mitral annulus e' velocities.  The patient does not symptoms concerning for COVID-19 infection (fever, chills, cough, or new SHORTNESS OF BREATH).   The patient specifically denies any chest pain at rest exertion, dyspnea at rest or with exertion, orthopnea, paroxysmal nocturnal dyspnea, syncope, palpitations, focal neurological deficits, intermittent claudication, lower extremity edema, unexplained weight gain, cough,  hemoptysis or wheezing.  She is feeling well and reports her typical BP is normal. Yesterday her BP was 130/82 mm Hg. Today her BP was high, but she is nervous about the telemedicine visit.  She is having headaches and is going to see Dr. Greig Right, [facility].    Prior CV studies:   The following studies were reviewed today:  echo Dec 2019  Past Medical History:  Diagnosis Date   Asthma    As a child   Degenerative disk disease    Dyspnea    Dysrhythmia    Fibromyalgia    Hypertension    Migraine    Mitral valve prolapse    Ovarian cyst    Palpitations    Panic attacks    Renal disorder    Scoliosis    Seizure ([place])    after birth of  child due to high BP   UTI (lower urinary tract infection)    Vertigo    Past Surgical History:  Procedure Laterality Date   BACK SURGERY     CESAREAN SECTION     CHOLECYSTECTOMY     CYSTOSCOPY  01/09/2016   Procedure: CYSTOSCOPY;  Surgeon: Benjaman Kindler, MD;  Location: [facility] ORS;  Service: Gynecology;;   ESOPHAGOGASTRODUODENOSCOPY ENDOSCOPY     HYSTEROSCOPY W/D&C N/A 01/09/2016   Procedure: DILATATION AND CURETTAGE /HYSTEROSCOPY/endometrial pap smear;  Surgeon: Benjaman Kindler, MD;  Location: [facility] ORS;  Service: Gynecology;  Laterality: N/A;   KNEE SURGERY     LAPAROSCOPIC BILATERAL SALPINGECTOMY Bilateral 07/11/2018   Procedure: LAPAROSCOPIC BILATERAL SALPINGECTOMY;  Surgeon: Benjaman Kindler, MD;  Location: [facility] ORS;  Service: Gynecology;  Laterality: Bilateral;   LAPAROSCOPIC HYSTERECTOMY  N/A 07/11/2018   Procedure: HYSTERECTOMY TOTAL LAPAROSCOPIC;  Surgeon: Benjaman Kindler, MD;  Location: [facility] ORS;  Service: Gynecology;  Laterality: N/A;   LAPAROSCOPIC OVARIAN CYSTECTOMY Left 07/11/2018   Procedure: LAPAROSCOPIC OVARIAN CYSTECTOMY;  Surgeon: Benjaman Kindler, MD;  Location: [facility] ORS;  Service: Gynecology;  Laterality: Left;   LAPAROSCOPY     Radio Frequency Albiation      TUBAL LIGATION        Current Meds  Medication Sig   Ascorbic Acid (VITAMIN C) 1000 MG tablet Take 1,000 mg by mouth daily.   bisoprolol-hydrochlorothiazide (ZIAC) 5-6.25 MG tablet Take 1 tablet by mouth daily.   Capsicum, Cayenne, (CAYENNE PO) Take 1 capsule by mouth daily.   ECHINACEA PO Take 1 tablet by mouth daily.   Ginger, Zingiber officinalis, (GINGER ROOT PO) Take 1 tablet by mouth daily.   meclizine (ANTIVERT) 25 MG tablet Take 1 tablet (25 mg total) by mouth 3 (three) times daily. (Patient taking differently: Take 25 mg by mouth 3 (three) times daily as needed for dizziness or nausea. )   nitroGLYCERIN (NITROSTAT) 0.4 MG SL tablet Place 0.4 mg under the tongue every 5 (five) minutes as needed for chest pain.    TURMERIC PO Take 1 capsule by mouth daily.   Vitamin D, Cholecalciferol, 1000 units TABS Take 1,000 Units by mouth daily.      Allergies:   Sulfonamide derivatives; Ciprofloxacin; Dilaudid [hydromorphone hcl]; Latex; Morphine and related; Mushroom extract complex; Norvasc [amlodipine]; Pineapple; and Amlodipine besylate   Social History   Tobacco Use   Smoking status: Never Smoker   Smokeless tobacco: Never Used  Substance Use Topics   Alcohol use: No   Drug use: No     Family Hx: The patient's family history includes Dementia in her father; Diabetes in her father and mother; Heart disease in her mother; Hypertension in her father and mother; Schizophrenia in her father; Stroke in her mother.  ROS:   Please see the history of present illness.     All other systems reviewed and are negative.   Labs/Other Tests and Data Reviewed:    Recent Labs: 10/09/2018: BUN 10; Creatinine, Ser 0.56; Hemoglobin 12.4; Platelets 404; Potassium 3.9; Sodium 139   Recent Lipid Panel Lab Results  Component Value Date/Time   CHOL 138 06/12/2011 06:25 AM   TRIG 56 06/12/2011 06:25 AM   HDL 46 06/12/2011 06:25 AM   CHOLHDL 3.0 06/12/2011 06:25 AM   LDLCALC 81 06/12/2011 06:25 AM     Wt Readings from Last 3 Encounters:  10/13/18 216 lb 9.6 oz (98.2 kg)  10/09/18 210 lb (95.3 kg)  07/11/18 213 lb 3 oz (96.7 kg)     Exam:    Vital Signs:  BP (!) 139/95    Pulse 74    Ht 5\' 3"  (1.6 m)    Wt 210 lb 12.8 oz (95.6 kg)    LMP 06/23/2018    BMI 37.34 kg/m    Severely obese female (BMI 38) in no acute distress.   ASSESSMENT & PLAN:    1. Chest pain:  Previous problems sounded musculoskeletal. 2. MVP: not supported by exam or recent echo. 3. HTN: appears to usually have good BP control. Discussed sodium restricted diet in detail. Unclear whether there is any correlation between her BP and the headaches. Has a Neurology appt coming up at [facility] (Dr. Lovena Le). 4. Obesity:  Weight loss would help BP control. Recommended exercise at least 2.5 h/week.   COVID-19 Education: The signs  and symptoms of COVID-19 were discussed with the patient and how to seek care for testing (follow up with PCP or arrange E-visit).  The importance of social distancing was discussed today.  Patient Risk:   After full review of this patients clinical status, I feel that they are at least moderate risk at this time.  Time:   Today, I have spent 21 minutes with the patient with telehealth technology discussing HTN, diastolic dysfunction, sodium restriction, signs and symptoms of CHF, etc.     Medication Adjustments/Labs and Tests Ordered: Current medicines are reviewed at length with the patient today.  Concerns regarding medicines are outlined above.  Tests Ordered: No orders of the defined types were placed in this encounter.  Medication Changes: No orders of the defined types were placed in this encounter.   Disposition:  12 months  Signed, Sanda Klein, MD  01/30/2019 10:21 AM    [place]

## 2021-03-16 ENCOUNTER — Encounter (HOSPITAL_COMMUNITY): Payer: Self-pay

## 2021-03-16 ENCOUNTER — Emergency Department (HOSPITAL_COMMUNITY)
Admission: EM | Admit: 2021-03-16 | Discharge: 2021-03-16 | Disposition: A | Discharge status: Home / Self Care | Payer: Commercial Managed Care - PPO | Attending: Emergency Medicine | Admitting: Emergency Medicine

## 2021-03-16 ENCOUNTER — Other Ambulatory Visit: Payer: Self-pay

## 2021-03-16 ENCOUNTER — Emergency Department (HOSPITAL_COMMUNITY): Payer: Commercial Managed Care - PPO

## 2021-03-16 DIAGNOSIS — I10 Essential (primary) hypertension: Secondary | ICD-10-CM | POA: Diagnosis not present

## 2021-03-16 DIAGNOSIS — J45909 Unspecified asthma, uncomplicated: Secondary | ICD-10-CM | POA: Diagnosis not present

## 2021-03-16 DIAGNOSIS — Z79899 Other long term (current) drug therapy: Secondary | ICD-10-CM | POA: Diagnosis not present

## 2021-03-16 DIAGNOSIS — G8929 Other chronic pain: Secondary | ICD-10-CM | POA: Diagnosis not present

## 2021-03-16 DIAGNOSIS — M25561 Pain in right knee: Secondary | ICD-10-CM | POA: Diagnosis not present

## 2021-03-16 DIAGNOSIS — Z91040 Latex allergy status: Secondary | ICD-10-CM | POA: Diagnosis not present

## 2021-03-16 NOTE — Discharge Instructions (Addendum)
Your x-ray today showed a small fluid collection, however no deformities in your hardware, there is no signs of infection to your knee.  Please make sure to keep your appointment with the orthopedist.  You may take Tylenol or ibuprofen for pain.  Return to the ER for any new or worsening symptoms.

## 2021-03-16 NOTE — ED Provider Notes (Signed)
Emergency Medicine Provider Triage Evaluation Note  [name] , a 45 y.o. female  was evaluated in triage.  Pt complains of right knee pain.  Patient reports she has had reconstructive knee surgery several years ago, has struck her knee several times over the last few years and feels like her knee is "falling apart".  She states that she feels weak and is afraid that her knee might give out.  She has an appointment with her orthopedist on the 23rd this month, however does not want to wait as she is worried about possible infection or doing more damage to her knee.  She denies any swelling, redness, fevers, chills, numbness or tingling.  Review of Systems  Positive: As above Negative: As above  Physical Exam  BP (!) 175/110 (BP Location: Right Arm)   Pulse 74   Temp 98.8 F (37.1 C) (Oral)   Resp 18   Ht 5\' 3"  (1.6 m)   Wt 97.1 kg   LMP 06/23/2018   SpO2 98%   BMI 37.91 kg/m  Gen:   Awake, no distress   Resp:  Normal effort  MSK:   Moves extremities without difficulty.  Full range of motion of right knee, no overlying erythema, warmth Other:    Medical Decision Making  Medically screening exam initiated at 8:47 PM.  Appropriate orders placed.  [name] was informed that the remainder of the evaluation will be completed by another provider, this initial triage assessment does not replace that evaluation, and the importance of remaining in the ED until their evaluation is complete.     Lyndel Safe 03/16/21 2049    Daleen Bo, MD 03/16/21 [phone]

## 2021-03-16 NOTE — ED Triage Notes (Signed)
Pt to ED by POV from home with c/o R knee pain. Pt states she had reconstructive on her knee several years ago, but that she has recently struck her knee twice and feels like the hardware is displaced. Pt states she has an appointment on 5/23 but that she is afraid there may be damage done and wants to be seen before that. Arrives A+O, VSS, NADN.

## 2021-03-16 NOTE — ED Provider Notes (Addendum)
[facility] DEPT Provider Note   CSN: [id number] Arrival date & time: 03/16/21  2004     History Chief Complaint  Patient presents with  . Knee Injury    [name] is a 45 y.o. female.  HPI 45 year old female with a history of fibromyalgia, hypertension, panic attacks presents to the ER with complains of right knee pain has been ongoing for years.  Patient reports she has had reconstructive knee surgery several years ago, has struck her knee several times over the last few years and feels like her knee is "falling apart".  She states that she feels weak and is afraid that her knee might give out.  She has an appointment with her orthopedist on the 23rd this month, however does not want to wait as she is worried about possible infection or doing more damage to her knee.  She denies any swelling, redness, fevers, chills, numbness or tingling.    Past Medical History:  Diagnosis Date  . Asthma    As a child  . Degenerative disk disease   . Dyspnea   . Dysrhythmia   . Fibromyalgia   . Hypertension   . Migraine   . Mitral valve prolapse   . Ovarian cyst   . Palpitations   . Panic attacks   . Renal disorder   . Scoliosis   . Seizure ([place])    after birth of  child due to high BP  . UTI (lower urinary tract infection)   . Vertigo     Patient Active Problem List   Diagnosis Date Noted  . Menorrhagia 07/11/2018  . Pelvic pain in female 01/09/2016  . Panic attacks 09/15/2015  . Hypertension 09/15/2015  . Scoliosis 09/15/2015  . Renal disorder 09/15/2015  . Asthma 09/15/2015  . Sepsis due Pyelonephritis   09/15/2015  . Pyelonephritis 09/15/2015  . BV (bacterial vaginosis) 03/22/2014  . Unspecified symptom associated with female genital organs 03/08/2014  . Unspecified disorder of menstruation and other abnormal bleeding from female genital tract 03/08/2014  . Mitral valve disorder 07/17/2010  . PALPITATIONS 07/17/2010  . PRECORDIAL  PAIN 07/17/2010  . OTH NONSPECIFIC ABNORM CV SYSTEM FUNCTION STUDY 07/17/2010    Past Surgical History:  Procedure Laterality Date  . BACK SURGERY    . CESAREAN SECTION    . CHOLECYSTECTOMY    . CYSTOSCOPY  01/09/2016   Procedure: CYSTOSCOPY;  Surgeon: Benjaman Kindler, MD;  Location: [facility] ORS;  Service: Gynecology;;  . ESOPHAGOGASTRODUODENOSCOPY ENDOSCOPY    . HYSTEROSCOPY WITH D & C N/A 01/09/2016   Procedure: DILATATION AND CURETTAGE /HYSTEROSCOPY/endometrial pap smear;  Surgeon: Benjaman Kindler, MD;  Location: [facility];  Service: Gynecology;  Laterality: N/A;  . KNEE SURGERY    . LAPAROSCOPIC BILATERAL SALPINGECTOMY Bilateral 07/11/2018   Procedure: LAPAROSCOPIC BILATERAL SALPINGECTOMY;  Surgeon: Benjaman Kindler, MD;  Location: [facility];  Service: Gynecology;  Laterality: Bilateral;  . LAPAROSCOPIC HYSTERECTOMY N/A 07/11/2018   Procedure: HYSTERECTOMY TOTAL LAPAROSCOPIC;  Surgeon: Benjaman Kindler, MD;  Location: [facility];  Service: Gynecology;  Laterality: N/A;  . LAPAROSCOPIC OVARIAN CYSTECTOMY Left 07/11/2018   Procedure: LAPAROSCOPIC OVARIAN CYSTECTOMY;  Surgeon: Benjaman Kindler, MD;  Location: [facility];  Service: Gynecology;  Laterality: Left;  . LAPAROSCOPY    . Radio Frequency Albiation     . TUBAL LIGATION       OB History    Gravida  3   Para  3   Term  2   Preterm  1  AB      Living  3     SAB      IAB      Ectopic      Multiple      Live Births  3           Family History  Problem Relation Age of Onset  . Hypertension Mother   . Diabetes Mother   . Stroke Mother   . Heart disease Mother   . Hypertension Father   . Diabetes Father   . Dementia Father   . Schizophrenia Father     Social History   Tobacco Use  . Smoking status: Never Smoker  . Smokeless tobacco: Never Used  Vaping Use  . Vaping Use: Never used  Substance Use Topics  . Alcohol use: No  . Drug use: No    Home Medications Prior to Admission medications   Medication Sig  Start Date End Date Taking? Authorizing Provider  Ascorbic Acid (VITAMIN C) 1000 MG tablet Take 1,000 mg by mouth daily.    [provider]  bisoprolol-hydrochlorothiazide (ZIAC) 5-6.25 MG tablet Take 1 tablet by mouth daily. 10/22/18   Croitoru, Mihai, MD  Capsicum, Cayenne, (CAYENNE PO) Take 1 capsule by mouth daily.    [provider]  ECHINACEA PO Take 1 tablet by mouth daily.    [provider]  Ginger, Zingiber officinalis, (GINGER ROOT PO) Take 1 tablet by mouth daily.    [provider]  meclizine (ANTIVERT) 25 MG tablet Take 1 tablet (25 mg total) by mouth 3 (three) times daily. Patient taking differently: Take 25 mg by mouth 3 (three) times daily as needed for dizziness or nausea.  06/09/16   Carmin Muskrat, MD  nitroGLYCERIN (NITROSTAT) 0.4 MG SL tablet Place 0.4 mg under the tongue every 5 (five) minutes as needed for chest pain.     [provider]  TURMERIC PO Take 1 capsule by mouth daily.    [provider]  Vitamin D, Cholecalciferol, 1000 units TABS Take 1,000 Units by mouth daily.     [provider]  clonazePAM (KLONOPIN) 1 MG tablet Take 1 mg by mouth 4 (four) times daily.    01/09/12  [provider]  metoprolol tartrate (LOPRESSOR) 25 MG tablet Take 25 mg by mouth 2 (two) times daily.    01/09/12  [provider]    Allergies    Sulfonamide derivatives, Gabapentin, Ciprofloxacin, Dilaudid [hydromorphone hcl], Latex, Morphine and related, Mushroom extract complex, Norvasc [amlodipine], Pineapple, and Amlodipine besylate  Review of Systems   Review of Systems  Constitutional: Negative for fever.  Musculoskeletal: Positive for arthralgias.  Neurological: Positive for weakness. Negative for numbness.    Physical Exam Updated Vital Signs BP (!) 175/110 (BP Location: Right Arm)   Pulse 74   Temp 98.8 F (37.1 C) (Oral)   Resp 18   Ht 5\' 3"  (1.6 m)   Wt 97.1 kg   LMP 06/23/2018   SpO2 98%    BMI 37.91 kg/m   Physical Exam Vitals and nursing note reviewed.  Constitutional:      General: She is not in acute distress.    Appearance: She is well-developed.  HENT:     Head: Normocephalic and atraumatic.  Eyes:     Conjunctiva/sclera: Conjunctivae normal.  Cardiovascular:     Rate and Rhythm: Normal rate and regular rhythm.     Heart sounds: No murmur heard.   Pulmonary:     Effort: Pulmonary  effort is normal. No respiratory distress.     Breath sounds: Normal breath sounds.  Abdominal:     Palpations: Abdomen is soft.     Tenderness: There is no abdominal tenderness.  Musculoskeletal:     Cervical back: Neck supple.     Comments: Right knee with no visible deformities, no swelling, no overlying erythema, warmth.  Full flexion and extension of the right knee.  Skin:    General: Skin is warm and dry.  Neurological:     Mental Status: She is alert.     ED Results / Procedures / Treatments   Labs (all labs ordered are listed, but only abnormal results are displayed) Labs Reviewed - No data to display  EKG None  Radiology DG Knee Complete 4 Views Right  Result Date: 03/16/2021 CLINICAL DATA:  Right knee pain.  Prior surgery EXAM: RIGHT KNEE - COMPLETE 4+ VIEW COMPARISON:  01/06/2015 FINDINGS: Normal joint spaces and alignment. Two screws traverse the proximal tibia without periprosthetic lucency. Trace peripheral spurring. Small knee joint effusion. No erosion or bone destruction. No fracture. No focal soft tissue abnormality. IMPRESSION: Small knee joint effusion. Trace osteoarthritis. Postsurgical change of the proximal tibia without acute bony abnormality. Electronically Signed   By: Keith Rake M.D.   On: 03/16/2021 21:09    Procedures Procedures   Medications Ordered in ED Medications - No data to display  ED Course  I have reviewed the triage vital signs and the nursing notes.  Pertinent labs & imaging results that were available during my care  of the patient were reviewed by me and considered in my medical decision making (see chart for details).    MDM Rules/Calculators/A&P                          45 year old female with right knee pain.  No signs of significant effusion, warmth, no signs of septic joint or gout. No tibial plateau tenderness.  No history of IV drug use.  Plain films with small joint effusion, with some OA.  No visible deformities in the hardware.    Patient was noted to be hypertensive.  States she not take her hypertension medications today.  No signs of hypertensive urgency/emergency.  Patient was informed of these findings.  She was encouraged to take Tylenol or ibuprofen for pain.  Stressed keeping her appointment with orthopedist.  Return precautions discussed.  She voiced understanding and is agreeable with Final Clinical Impression(s) / ED Diagnoses Final diagnoses:  Chronic pain of right knee    Rx / DC Orders ED Discharge Orders    None           Lyndel Safe 03/16/21 2150    Daleen Bo, MD 03/16/21 2355
